# Patient Record
Sex: Female | Born: 1965 | Race: White | Hispanic: No | Marital: Married | Smoking: Former smoker
Health system: Southern US, Community
[De-identification: ages and names within clinical notes are randomized; demographics above are authoritative.]

## PROBLEM LIST (undated history)

## (undated) DIAGNOSIS — O039 Complete or unspecified spontaneous abortion without complication: Secondary | ICD-10-CM

## (undated) DIAGNOSIS — Z807 Family history of other malignant neoplasms of lymphoid, hematopoietic and related tissues: Secondary | ICD-10-CM

## (undated) DIAGNOSIS — K449 Diaphragmatic hernia without obstruction or gangrene: Secondary | ICD-10-CM

## (undated) DIAGNOSIS — R112 Nausea with vomiting, unspecified: Secondary | ICD-10-CM

## (undated) DIAGNOSIS — Z808 Family history of malignant neoplasm of other organs or systems: Secondary | ICD-10-CM

## (undated) DIAGNOSIS — F410 Panic disorder [episodic paroxysmal anxiety] without agoraphobia: Secondary | ICD-10-CM

## (undated) DIAGNOSIS — Z8489 Family history of other specified conditions: Secondary | ICD-10-CM

## (undated) DIAGNOSIS — R32 Unspecified urinary incontinence: Secondary | ICD-10-CM

## (undated) DIAGNOSIS — K760 Fatty (change of) liver, not elsewhere classified: Secondary | ICD-10-CM

## (undated) DIAGNOSIS — K219 Gastro-esophageal reflux disease without esophagitis: Secondary | ICD-10-CM

## (undated) DIAGNOSIS — N3010 Interstitial cystitis (chronic) without hematuria: Secondary | ICD-10-CM

## (undated) DIAGNOSIS — Z803 Family history of malignant neoplasm of breast: Secondary | ICD-10-CM

## (undated) DIAGNOSIS — K5730 Diverticulosis of large intestine without perforation or abscess without bleeding: Secondary | ICD-10-CM

## (undated) DIAGNOSIS — Z800 Family history of malignant neoplasm of digestive organs: Secondary | ICD-10-CM

## (undated) DIAGNOSIS — I10 Essential (primary) hypertension: Secondary | ICD-10-CM

## (undated) DIAGNOSIS — K589 Irritable bowel syndrome without diarrhea: Secondary | ICD-10-CM

## (undated) DIAGNOSIS — M1990 Unspecified osteoarthritis, unspecified site: Secondary | ICD-10-CM

## (undated) DIAGNOSIS — D126 Benign neoplasm of colon, unspecified: Secondary | ICD-10-CM

## (undated) DIAGNOSIS — R51 Headache: Secondary | ICD-10-CM

## (undated) DIAGNOSIS — K648 Other hemorrhoids: Secondary | ICD-10-CM

## (undated) HISTORY — DX: Fatty (change of) liver, not elsewhere classified: K76.0

## (undated) HISTORY — DX: Family history of other specified conditions: Z84.89

## (undated) HISTORY — DX: Gastro-esophageal reflux disease without esophagitis: K21.9

## (undated) HISTORY — DX: Family history of malignant neoplasm of other organs or systems: Z80.8

## (undated) HISTORY — DX: Benign neoplasm of colon, unspecified: D12.6

## (undated) HISTORY — DX: Essential (primary) hypertension: I10

## (undated) HISTORY — PX: SKIN GRAFT: SHX250

## (undated) HISTORY — PX: OTHER SURGICAL HISTORY: SHX169

## (undated) HISTORY — DX: Headache: R51

## (undated) HISTORY — DX: Diverticulosis of large intestine without perforation or abscess without bleeding: K57.30

## (undated) HISTORY — PX: ABDOMINAL HYSTERECTOMY: SHX81

## (undated) HISTORY — DX: Unspecified osteoarthritis, unspecified site: M19.90

## (undated) HISTORY — DX: Panic disorder (episodic paroxysmal anxiety): F41.0

## (undated) HISTORY — PX: NASAL SINUS SURGERY: SHX719

## (undated) HISTORY — DX: Diaphragmatic hernia without obstruction or gangrene: K44.9

## (undated) HISTORY — DX: Complete or unspecified spontaneous abortion without complication: O03.9

## (undated) HISTORY — DX: Family history of malignant neoplasm of breast: Z80.3

## (undated) HISTORY — DX: Other hemorrhoids: K64.8

## (undated) HISTORY — PX: NISSEN FUNDOPLICATION: SHX2091

## (undated) HISTORY — DX: Unspecified urinary incontinence: R32

## (undated) HISTORY — DX: Irritable bowel syndrome, unspecified: K58.9

## (undated) HISTORY — DX: Family history of malignant neoplasm of digestive organs: Z80.0

## (undated) HISTORY — PX: TYMPANOSTOMY TUBE PLACEMENT: SHX32

## (undated) HISTORY — DX: Interstitial cystitis (chronic) without hematuria: N30.10

## (undated) HISTORY — DX: Family history of other malignant neoplasms of lymphoid, hematopoietic and related tissues: Z80.7

---

## 1999-01-30 ENCOUNTER — Emergency Department (HOSPITAL_COMMUNITY): Admission: EM | Admit: 1999-01-30 | Discharge: 1999-01-30 | Payer: Self-pay | Admitting: Emergency Medicine

## 1999-01-30 ENCOUNTER — Encounter: Payer: Self-pay | Admitting: *Deleted

## 1999-04-25 ENCOUNTER — Encounter: Payer: Self-pay | Admitting: Gastroenterology

## 1999-04-25 ENCOUNTER — Ambulatory Visit (HOSPITAL_COMMUNITY): Admission: RE | Admit: 1999-04-25 | Discharge: 1999-04-25 | Payer: Self-pay | Admitting: Gastroenterology

## 2000-09-09 ENCOUNTER — Ambulatory Visit (HOSPITAL_BASED_OUTPATIENT_CLINIC_OR_DEPARTMENT_OTHER): Admission: RE | Admit: 2000-09-09 | Discharge: 2000-09-09 | Payer: Self-pay | Admitting: *Deleted

## 2000-10-31 ENCOUNTER — Encounter (INDEPENDENT_AMBULATORY_CARE_PROVIDER_SITE_OTHER): Payer: Self-pay | Admitting: *Deleted

## 2000-10-31 ENCOUNTER — Ambulatory Visit (HOSPITAL_BASED_OUTPATIENT_CLINIC_OR_DEPARTMENT_OTHER): Admission: RE | Admit: 2000-10-31 | Discharge: 2000-10-31 | Payer: Self-pay | Admitting: *Deleted

## 2001-11-05 ENCOUNTER — Encounter: Payer: Self-pay | Admitting: Family Medicine

## 2001-11-05 ENCOUNTER — Ambulatory Visit (HOSPITAL_COMMUNITY): Admission: RE | Admit: 2001-11-05 | Discharge: 2001-11-05 | Payer: Self-pay | Admitting: Family Medicine

## 2003-05-05 ENCOUNTER — Emergency Department (HOSPITAL_COMMUNITY): Admission: AD | Admit: 2003-05-05 | Discharge: 2003-05-05 | Payer: Self-pay | Admitting: Emergency Medicine

## 2003-05-31 ENCOUNTER — Ambulatory Visit (HOSPITAL_COMMUNITY): Admission: RE | Admit: 2003-05-31 | Discharge: 2003-05-31 | Payer: Self-pay | Admitting: Neurology

## 2003-06-03 ENCOUNTER — Encounter: Admission: RE | Admit: 2003-06-03 | Discharge: 2003-08-30 | Payer: Self-pay | Admitting: Neurology

## 2003-07-15 ENCOUNTER — Other Ambulatory Visit: Admission: RE | Admit: 2003-07-15 | Discharge: 2003-07-15 | Payer: Self-pay | Admitting: Obstetrics and Gynecology

## 2003-07-22 ENCOUNTER — Encounter: Admission: RE | Admit: 2003-07-22 | Discharge: 2003-07-22 | Payer: Self-pay | Admitting: Obstetrics and Gynecology

## 2003-07-22 ENCOUNTER — Encounter: Payer: Self-pay | Admitting: Obstetrics and Gynecology

## 2003-09-07 ENCOUNTER — Emergency Department (HOSPITAL_COMMUNITY): Admission: EM | Admit: 2003-09-07 | Discharge: 2003-09-08 | Payer: Self-pay | Admitting: Emergency Medicine

## 2003-11-26 ENCOUNTER — Ambulatory Visit (HOSPITAL_COMMUNITY): Admission: RE | Admit: 2003-11-26 | Discharge: 2003-11-26 | Payer: Self-pay | Admitting: Family Medicine

## 2004-02-09 ENCOUNTER — Emergency Department (HOSPITAL_COMMUNITY): Admission: EM | Admit: 2004-02-09 | Discharge: 2004-02-09 | Payer: Self-pay | Admitting: Emergency Medicine

## 2004-11-27 ENCOUNTER — Inpatient Hospital Stay (HOSPITAL_COMMUNITY): Admission: AD | Admit: 2004-11-27 | Discharge: 2004-11-27 | Payer: Self-pay | Admitting: Obstetrics and Gynecology

## 2005-04-19 ENCOUNTER — Encounter: Admission: RE | Admit: 2005-04-19 | Discharge: 2005-04-19 | Payer: Self-pay | Admitting: Gynecology

## 2005-04-30 ENCOUNTER — Encounter: Admission: RE | Admit: 2005-04-30 | Discharge: 2005-04-30 | Payer: Self-pay | Admitting: Gynecology

## 2005-12-10 ENCOUNTER — Encounter: Admission: RE | Admit: 2005-12-10 | Discharge: 2005-12-10 | Payer: Self-pay | Admitting: Gynecology

## 2006-04-22 ENCOUNTER — Encounter: Admission: RE | Admit: 2006-04-22 | Discharge: 2006-04-22 | Payer: Self-pay | Admitting: Family Medicine

## 2006-12-19 ENCOUNTER — Ambulatory Visit: Payer: Self-pay | Admitting: Gastroenterology

## 2006-12-19 LAB — CONVERTED CEMR LAB
ALT: 7 units/L (ref 0–40)
AST: 22 units/L (ref 0–37)
Albumin: 3.6 g/dL (ref 3.5–5.2)
Basophils Relative: 0.1 % (ref 0.0–1.0)
Bilirubin Urine: NEGATIVE
Calcium: 8.7 mg/dL (ref 8.4–10.5)
Chloride: 104 meq/L (ref 96–112)
Creatinine, Ser: 0.6 mg/dL (ref 0.4–1.2)
Crystals: NEGATIVE
Eosinophils Relative: 0.7 % (ref 0.0–5.0)
GFR calc non Af Amer: 117 mL/min
Glucose, Bld: 88 mg/dL (ref 70–99)
HCT: 28.9 % — ABNORMAL LOW (ref 36.0–46.0)
Hemoglobin: 10 g/dL — ABNORMAL LOW (ref 12.0–15.0)
MCHC: 34.6 g/dL (ref 30.0–36.0)
Monocytes Absolute: 0.2 10*3/uL (ref 0.2–0.7)
Neutrophils Relative %: 69.8 % (ref 43.0–77.0)
Nitrite: NEGATIVE
RBC: 3.48 M/uL — ABNORMAL LOW (ref 3.87–5.11)
RDW: 12.7 % (ref 11.5–14.6)
Sed Rate: 17 mm/hr (ref 0–25)
Sodium: 140 meq/L (ref 135–145)
Specific Gravity, Urine: >=1.03 (ref 1.000–1.03)
Total Bilirubin: 0.7 mg/dL (ref 0.3–1.2)
Total Protein, Urine: NEGATIVE mg/dL
WBC: 5.2 10*3/uL (ref 4.5–10.5)
pH: 5.5 (ref 5.0–8.0)

## 2007-01-06 ENCOUNTER — Ambulatory Visit: Payer: Self-pay | Admitting: Gastroenterology

## 2007-06-05 ENCOUNTER — Encounter: Admission: RE | Admit: 2007-06-05 | Discharge: 2007-06-05 | Payer: Self-pay | Admitting: Obstetrics and Gynecology

## 2008-10-08 DIAGNOSIS — D126 Benign neoplasm of colon, unspecified: Secondary | ICD-10-CM

## 2008-10-08 HISTORY — DX: Benign neoplasm of colon, unspecified: D12.6

## 2009-02-24 DIAGNOSIS — R519 Headache, unspecified: Secondary | ICD-10-CM | POA: Insufficient documentation

## 2009-02-24 DIAGNOSIS — R32 Unspecified urinary incontinence: Secondary | ICD-10-CM | POA: Insufficient documentation

## 2009-02-24 DIAGNOSIS — K449 Diaphragmatic hernia without obstruction or gangrene: Secondary | ICD-10-CM | POA: Insufficient documentation

## 2009-02-24 DIAGNOSIS — F410 Panic disorder [episodic paroxysmal anxiety] without agoraphobia: Secondary | ICD-10-CM | POA: Insufficient documentation

## 2009-02-24 DIAGNOSIS — K5730 Diverticulosis of large intestine without perforation or abscess without bleeding: Secondary | ICD-10-CM | POA: Insufficient documentation

## 2009-02-24 DIAGNOSIS — K219 Gastro-esophageal reflux disease without esophagitis: Secondary | ICD-10-CM | POA: Insufficient documentation

## 2009-02-24 DIAGNOSIS — I10 Essential (primary) hypertension: Secondary | ICD-10-CM | POA: Insufficient documentation

## 2009-02-24 DIAGNOSIS — R51 Headache: Secondary | ICD-10-CM | POA: Insufficient documentation

## 2009-02-24 DIAGNOSIS — M1990 Unspecified osteoarthritis, unspecified site: Secondary | ICD-10-CM | POA: Insufficient documentation

## 2009-02-24 DIAGNOSIS — T7840XA Allergy, unspecified, initial encounter: Secondary | ICD-10-CM | POA: Insufficient documentation

## 2009-02-24 DIAGNOSIS — K589 Irritable bowel syndrome without diarrhea: Secondary | ICD-10-CM | POA: Insufficient documentation

## 2009-02-25 ENCOUNTER — Ambulatory Visit: Payer: Self-pay | Admitting: Gastroenterology

## 2009-02-25 DIAGNOSIS — R197 Diarrhea, unspecified: Secondary | ICD-10-CM | POA: Insufficient documentation

## 2009-02-25 LAB — CONVERTED CEMR LAB
ALT: 6 units/L (ref 0–35)
Alkaline Phosphatase: 93 units/L (ref 39–117)
Basophils Absolute: 0.1 10*3/uL (ref 0.0–0.1)
Basophils Relative: 0.9 % (ref 0.0–3.0)
Bilirubin, Direct: 0.1 mg/dL (ref 0.0–0.3)
CO2: 26 meq/L (ref 19–32)
Chloride: 104 meq/L (ref 96–112)
Eosinophils Relative: 0.6 % (ref 0.0–5.0)
HCT: 37.6 % (ref 36.0–46.0)
Iron: 59 ug/dL (ref 42–145)
Lymphs Abs: 2.2 10*3/uL (ref 0.7–4.0)
MCHC: 33.9 g/dL (ref 30.0–36.0)
MCV: 82.8 fL (ref 78.0–100.0)
Neutro Abs: 4.4 10*3/uL (ref 1.4–7.7)
Potassium: 3.6 meq/L (ref 3.5–5.1)
RBC: 4.54 M/uL (ref 3.87–5.11)
Saturation Ratios: 12.3 % — ABNORMAL LOW (ref 20.0–50.0)
Sodium: 143 meq/L (ref 135–145)
Total Protein: 7.3 g/dL (ref 6.0–8.3)
Transferrin: 343.8 mg/dL (ref 212.0–360.0)
WBC: 6.9 10*3/uL (ref 4.5–10.5)

## 2009-03-04 ENCOUNTER — Encounter: Payer: Self-pay | Admitting: Gastroenterology

## 2009-03-04 ENCOUNTER — Ambulatory Visit: Payer: Self-pay | Admitting: Gastroenterology

## 2009-03-09 ENCOUNTER — Encounter: Payer: Self-pay | Admitting: Gastroenterology

## 2009-03-25 ENCOUNTER — Encounter: Admission: RE | Admit: 2009-03-25 | Discharge: 2009-03-25 | Payer: Self-pay | Admitting: Obstetrics and Gynecology

## 2009-06-16 ENCOUNTER — Encounter: Admission: RE | Admit: 2009-06-16 | Discharge: 2009-06-16 | Payer: Self-pay | Admitting: Family Medicine

## 2010-04-20 ENCOUNTER — Encounter: Admission: RE | Admit: 2010-04-20 | Discharge: 2010-04-20 | Payer: Self-pay | Admitting: Obstetrics and Gynecology

## 2011-02-23 NOTE — Assessment & Plan Note (Signed)
Alicia Bentley                         GASTROENTEROLOGY OFFICE NOTE   Alicia Bentley, Alicia Bentley                        MRN:          [id number]  DATE:12/19/2006                            DOB:          1966/06/04    Alicia Bentley is a 45 year old white female, referred through Alicia Bentley  emergency room for evaluation of abdominal pain.   Over the last several months, the patient has had 3 episodes of severe,  spasmodic-type pain in her suprapubic area.  The last one requiring an  emergency room visit where she apparently had a flat plate of her  abdomen done and lab data.  This is not available for review despite our  request.  In between these episodes, she has alternating diarrhea and  constipation with gas and bloating, but has no melena or hematochezia.  She has no upper GI or hepatobiliary complaints, is status post  fundoplication in 2000.  Her last colonoscopy exam additionally was in  January of 2000 because of lower abdominal pain and suspected  diverticulitis despite a negative CT scan.  I could see no evidence of  diverticulosis at that time.   She denies fever, chills, anorexia, weight loss, any specific food  intolerances or precipitated elements to her pain.  She has been on  Levsin and then Bentyl without improvement.   PAST MEDICAL HISTORY:  Remarkable for central hypertension and a history  of panic disorders.   MEDICATIONS:  1. Labetalol 20 mg a day.  2. Nasonex daily.  3. Benefiber daily.  4. Zyrtec 10 mg a day.  5. Aspirin 81 mg.   She denies allergies   FAMILY HISTORY:  Noncontributory.   SOCIAL HISTORY:  She is married, lives with her husband, has a college  degree.  She does not smoke or use ethanol.   REVIEW OF SYSTEMS:  Noncontributory.  Last menstrual period was December 06, 2006.   She is a healthy-appearing white female, appearing stated age, in no  acute distress.  She is 5 feet 5-1/2 inches tall, weight 302 pounds,  blood pressure  122/84, pulse was 64 and regular.  I could not appreciate stigmata of chronic liver disease.  Her chest was clear and there were no murmurs, gallops, or rubs noted.  ABDOMINAL EXAM:  Unremarkable, without organomegaly, masses, or  tenderness.  Bowel sounds were normal.  EXTREMITIES:  Unremarkable.  MENTAL STATUS:  Clear.  Inspection of the rectum was unremarkable, as was rectal exam, without  impaction or tenderness.  Stool was guaiac negative.   ASSESSMENT:  Mrs. Kistler appears to have irritable bowel syndrome, but  her pain seems out of kilter with this diagnosis.  She apparently has  regular gynecological checkups, and this has all been negative, but  certainly a consideration.   RECOMMENDATIONS:  1. Repeat lab data.  2. Outpatient colonoscopy.  3. Continue high-fiber diet, and Benefiber with p.r.n. antispasmodics.  4. Consider abdominal/pelvic CT scan.     Alicia Bentley. Alicia Motto, MD, Caleen Essex, FAGA  Electronically Signed    DRP/MedQ  DD: 12/19/2006  DT: 12/20/2006  Job #: [id number]  cc:   Alicia Bentley, M.D.

## 2011-02-23 NOTE — Op Note (Signed)
[place]. [facility]  Patient:    Alicia Bentley, Alicia Bentley                         MRN: [id number] Proc. Date: 10/31/00 Adm. Date:  [id number] Disc. Date: [id number] Attending:  Claudina Lick                           Operative Report  PREOPERATIVE DIAGNOSES: 1. 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 2. Deviated nasal septum. 3. Chronic serous otitis media.  POSTOPERATIVE DIAGNOSES: 1. 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 2. Deviated nasal septum. 3. Chronic serous otitis media.  PROCEDURES PERFORMED: 1. Nasal septoplasty. 2. Bilateral endoscopic anterior ethmoidectomy. 3. Bilateral endoscopic maxillary antrostomy with removal of maxillary cysts. 4. Bilateral myringotomy.  SURGEON:  Robert L. Lyman Bishop, M.D.  ANESTHESIA:  General.  INDICATION FOR PROCEDURE:  This patient has had a lifelong history of recurring ear infections, chronic recurring sinusitis, had previous nasal septoplasty, t looked like she had had turbinate reductions as well, by another otoloaryngologist.  Also a history of tube myringotomies in the past. CT scan showed some partial soft tissue blockage of the osteomeatal complexes, and large maxillary sinus cysts, which had enlarged in size and were associated with pain in the upper teeth that would respond to antibiotics. Patient has also had some persistent fullness, pressure, and pain in her ears, with suspected middle ear fluid.  Patient admitted for surgery.  DESCRIPTION OF PROCEDURE:  After satisfactory general endotracheal anesthesia had been induced, topical epinephrine packs were placed inferonasally, after which the nasal septum, both middle turbinates, and the anterolateral nasal wall on each side was infiltrated with 1% Xylocaine containing 1:100,000 epinephrine for hemostasis, using a total of 7 cc.  The nose and face were then prepped with Betadine and sterile drapes applied.   Patient had obviously had a previous nasal septoplasty and partial reduction of the inferior nasal turbinates.  There was still septal deviation to the right, especially superiorly, with marked thickening of the nasal septum.  Inferiorly, the septum was reasonably straight and midline.  A left anterior septal incision was made, dissection carried around the caudal margin of the septum, and the mucoperichondrial and periosteal flap elevated on the right side.  The septal cartilage was partially separated from the remaining perpendicular plate, and the mucoperiosteal flap was then elevated on the left side.  Most of the high septal bulge to the right involved the perpendicular plate, and this was removed with the Jansen-[place] downbiting forceps.  More inferiorly, a small spur from the right side of the maxillary crest was taken down with the chisel.  The septal flaps were then reapproximated with mattress suture of 4-0 plain gut and the incision closed with running 5-0 chromic gut.  By removing the high septal deviation, I could now adequately visualize the right middle meatus area and using the 0 degree endoscope and a sickle knife, an incision was made along the uncinate process on each side and the uncinate process removed.  The bone in the anterior ethmoid region on each side was extremely hard and thick, perhaps from previous surgery.  The maxillary ostium on each side was found with a right-angle suction, and this was opened up widely into the anterior fontanelle with the backbiting forceps and through the enlarged maxillary ostium on each side, I was able to grasp the dome of the retention cyst and remove  a significant portion of the mucosa.  On the right side, there was some thick grayish exudate in the sinus that was evacuated with the suction.  On the left side, the fluid was more clear mucous-type.  A small strip of Adaptic gauze impregnated with Cortisporin ointment was placed  in each anterior ethmoid region, and each side of the nose was then packed with a folded strip of Telfa gauze, again coated with Cortisporin ointment.  Using the operative microscope, examination of both ears showed that the ears had been well-aerated with ballooning out of atrophic areas of the drum, separated by bands of scarring.  A small radial myringotomy incision was made at about 9 oclock in each eardrum.  There was no middle ear fluid; therefore, tubes were not inserted.  Cortisporin drops were instilled.  Patient was given IV Decadron and Ancef intraoperatively.  Estimated blood loss 10-15 cc.  The patient tolerated the procedure well, was awakened from anesthesia, and taken to the recovery room in satisfactory condition. DD:  10/31/00 TD:  10/31/00 Job: [id number]

## 2011-06-21 ENCOUNTER — Other Ambulatory Visit: Payer: Self-pay | Admitting: Obstetrics and Gynecology

## 2011-06-21 DIAGNOSIS — Z1231 Encounter for screening mammogram for malignant neoplasm of breast: Secondary | ICD-10-CM

## 2011-07-16 ENCOUNTER — Ambulatory Visit: Payer: Self-pay | Admitting: Unknown Physician Specialty

## 2011-07-31 ENCOUNTER — Ambulatory Visit
Admission: RE | Admit: 2011-07-31 | Discharge: 2011-07-31 | Disposition: A | Discharge status: Home / Self Care | Payer: BC Managed Care – PPO | Source: Ambulatory Visit | Attending: Obstetrics and Gynecology | Admitting: Obstetrics and Gynecology

## 2011-07-31 DIAGNOSIS — Z1231 Encounter for screening mammogram for malignant neoplasm of breast: Secondary | ICD-10-CM

## 2011-11-08 ENCOUNTER — Ambulatory Visit
Admission: RE | Admit: 2011-11-08 | Discharge: 2011-11-08 | Disposition: A | Discharge status: Home / Self Care | Payer: BC Managed Care – PPO | Source: Ambulatory Visit | Attending: Physician Assistant | Admitting: Physician Assistant

## 2011-11-08 ENCOUNTER — Other Ambulatory Visit: Payer: Self-pay | Admitting: Physician Assistant

## 2011-11-08 DIAGNOSIS — R05 Cough: Secondary | ICD-10-CM

## 2011-11-08 DIAGNOSIS — R0602 Shortness of breath: Secondary | ICD-10-CM

## 2011-11-08 DIAGNOSIS — R5383 Other fatigue: Secondary | ICD-10-CM

## 2011-11-08 DIAGNOSIS — R059 Cough, unspecified: Secondary | ICD-10-CM

## 2011-11-08 DIAGNOSIS — R42 Dizziness and giddiness: Secondary | ICD-10-CM

## 2012-07-16 ENCOUNTER — Other Ambulatory Visit: Payer: Self-pay | Admitting: Family Medicine

## 2012-07-16 DIAGNOSIS — M545 Low back pain, unspecified: Secondary | ICD-10-CM

## 2012-07-21 ENCOUNTER — Ambulatory Visit
Admission: RE | Admit: 2012-07-21 | Discharge: 2012-07-21 | Disposition: A | Discharge status: Home / Self Care | Payer: BC Managed Care – PPO | Source: Ambulatory Visit | Attending: Family Medicine | Admitting: Family Medicine

## 2012-07-21 DIAGNOSIS — M545 Low back pain, unspecified: Secondary | ICD-10-CM

## 2012-08-13 ENCOUNTER — Other Ambulatory Visit: Payer: Self-pay | Admitting: Obstetrics and Gynecology

## 2012-08-13 DIAGNOSIS — N644 Mastodynia: Secondary | ICD-10-CM

## 2012-08-28 ENCOUNTER — Ambulatory Visit
Admission: RE | Admit: 2012-08-28 | Discharge: 2012-08-28 | Disposition: A | Discharge status: Home / Self Care | Payer: BC Managed Care – PPO | Source: Ambulatory Visit | Attending: Obstetrics and Gynecology | Admitting: Obstetrics and Gynecology

## 2012-08-28 DIAGNOSIS — N644 Mastodynia: Secondary | ICD-10-CM

## 2012-12-01 ENCOUNTER — Ambulatory Visit
Admission: RE | Admit: 2012-12-01 | Discharge: 2012-12-01 | Disposition: A | Discharge status: Home / Self Care | Payer: BC Managed Care – PPO | Source: Ambulatory Visit | Attending: Family Medicine | Admitting: Family Medicine

## 2012-12-01 ENCOUNTER — Other Ambulatory Visit: Payer: Self-pay | Admitting: Family Medicine

## 2012-12-01 DIAGNOSIS — R109 Unspecified abdominal pain: Secondary | ICD-10-CM

## 2012-12-01 DIAGNOSIS — K5900 Constipation, unspecified: Secondary | ICD-10-CM

## 2012-12-25 ENCOUNTER — Encounter: Payer: Self-pay | Admitting: Gastroenterology

## 2012-12-25 ENCOUNTER — Ambulatory Visit (INDEPENDENT_AMBULATORY_CARE_PROVIDER_SITE_OTHER): Payer: BC Managed Care – PPO | Admitting: Gastroenterology

## 2012-12-25 ENCOUNTER — Other Ambulatory Visit (INDEPENDENT_AMBULATORY_CARE_PROVIDER_SITE_OTHER): Payer: BC Managed Care – PPO

## 2012-12-25 VITALS — BP 120/90 | HR 76 | Wt 316.0 lb

## 2012-12-25 DIAGNOSIS — Z86010 Personal history of colonic polyps: Secondary | ICD-10-CM

## 2012-12-25 DIAGNOSIS — Z860101 Personal history of adenomatous and serrated colon polyps: Secondary | ICD-10-CM

## 2012-12-25 DIAGNOSIS — K5730 Diverticulosis of large intestine without perforation or abscess without bleeding: Secondary | ICD-10-CM

## 2012-12-25 DIAGNOSIS — K219 Gastro-esophageal reflux disease without esophagitis: Secondary | ICD-10-CM

## 2012-12-25 LAB — COMPREHENSIVE METABOLIC PANEL
AST: 22 U/L (ref 0–37)
Alkaline Phosphatase: 89 U/L (ref 39–117)
BUN: 14 mg/dL (ref 6–23)
Creatinine, Ser: 0.7 mg/dL (ref 0.4–1.2)
Total Bilirubin: 0.6 mg/dL (ref 0.3–1.2)

## 2012-12-25 LAB — FOLATE: Folate: 15.3 ng/mL (ref >5.9)

## 2012-12-25 LAB — HEPATIC FUNCTION PANEL
ALT: 5 U/L (ref 0–35)
Bilirubin, Direct: 0.1 mg/dL (ref 0.0–0.3)
Total Bilirubin: 0.6 mg/dL (ref 0.3–1.2)

## 2012-12-25 LAB — CBC WITH DIFFERENTIAL/PLATELET
Basophils Relative: 0.4 % (ref 0.0–3.0)
Eosinophils Absolute: 0 10*3/uL (ref 0.0–0.7)
HCT: 38.5 % (ref 36.0–46.0)
Hemoglobin: 13 g/dL (ref 12.0–15.0)
Lymphs Abs: 2 10*3/uL (ref 0.7–4.0)
MCHC: 33.7 g/dL (ref 30.0–36.0)
MCV: 81.2 fl (ref 78.0–100.0)
Monocytes Absolute: 0.3 10*3/uL (ref 0.1–1.0)
Neutro Abs: 4.2 10*3/uL (ref 1.4–7.7)
Neutrophils Relative %: 63.7 % (ref 43.0–77.0)
RBC: 4.75 Mil/uL (ref 3.87–5.11)

## 2012-12-25 LAB — IBC PANEL
Iron: 52 ug/dL (ref 42–145)
Saturation Ratios: 11 % — ABNORMAL LOW (ref 20.0–50.0)

## 2012-12-25 LAB — FERRITIN: Ferritin: 14.5 ng/mL (ref 10.0–291.0)

## 2012-12-25 LAB — VITAMIN B12: Vitamin B-12: 349 pg/mL (ref 211–911)

## 2012-12-25 LAB — TSH: TSH: 2.83 u[IU]/mL (ref 0.35–5.50)

## 2012-12-25 MED ORDER — MOVIPREP 100 G PO SOLR: 1.0000 | Freq: Once | ORAL | Status: DC

## 2012-12-25 MED ORDER — CIPROFLOXACIN HCL 500 MG PO TABS: 500.0000 mg | ORAL_TABLET | Freq: Two times a day (BID) | ORAL | Status: DC

## 2012-12-25 MED ORDER — HYOSCYAMINE SULFATE 0.125 MG SL SUBL: 0.1250 mg | SUBLINGUAL_TABLET | SUBLINGUAL | Status: DC | PRN

## 2012-12-25 NOTE — Addendum Note (Signed)
Addended by: Ok Anis A on: 12/25/2012 11:05 AM   Modules accepted: Orders

## 2012-12-25 NOTE — Progress Notes (Signed)
History of Present Illness:  This is a morbidly obese 47 year old Caucasian female who in the past of seeing because of chronic diarrhea.  Colonoscopy in May of 2010 showed a prominent right colon polyp which was excised.  She's been doing well since that time without significant diarrhea or constipation until several weeks ago when she developed sudden constipation with left lower quadrant pain, gas and bloating with associated nausea but no emesis.  She had a KUB which showed evidence of constipation but no obstruction.  She was treated with a series of laxatives by her primary care physician including magnesium citrate and MiraLax.  She currently is on stool softeners, Citrucel, milk of magnesia is having every other day bowel movements without melena or hematochezia.  There is been no fever, chills, or upper GI or hepatobiliary complaints.  She does have acid reflux is been on the Dexilant 60 mg a day for 2 years.  Previously because of her IBS diarrhea she was on when necessary dicyclomine and when necessary Levsin.  She denies a specific food intolerances, anorexia, weight loss, or history of gallbladder liver disease.  Patient been obese for many years but denies associated cardiopulmonary symptoms.   I have reviewed this patient's present history, medical and surgical past history, allergies and medications.     ROS:   All systems were reviewed and are negative unless otherwise stated in the HPI.    Physical healthy-appearing but obese female appears stated age.  Blood pressure 120/90, pulse 76 and regular, and weight 316 with a BMI of 52.59.  98% oxygen saturation.  I cannot appreciate stigmata of chronic liver disease. General well developed well nourished patient in no acute distress, appearing their stated age Eyes PERRLA, no icterus, fundoscopic exam per opthamologist Skin no lesions noted Neck supple, no adenopathy, no thyroid enlargement, no tenderness Chest clear to percussion and  auscultation Heart no significant murmurs, gallops or rubs noted Abdomen no hepatosplenomegaly masses or tenderness, BS normal.  Rectal inspection normal no fissures, or fistulae noted.  No masses or tenderness on digital exam. Stool guaiac negative.  Anal skin tags noted.  Digital examination difficult because of her obesity. Extremities no acute joint lesions, edema, phlebitis or evidence of cellulitis. Neurologic patient oriented x 3, cranial nerves intact, no focal neurologic deficits noted. Psychological mental status normal and normal affect.  Assessment and plan: This patient probably had subacute diverticulitis several weeks ago which prompted her severe constipation in a long-term history of mild chronic IBS diarrhea..  There is no significant abdominal tenderness at this time, and bowel sounds are normal.  I have decided to place her on Cipro 500 mg twice a day, and we will perform colonoscopy next week with standard Movie prep.  Because of her obesity this will have to be done and and in a hospital setting. She in the past as had allergic reactions to tramadol.  I've given her some Levsin use when necessary, continue her current laxative therapy, and labs drawn for review including CBC and sedimentation rate.  She has a second cousin that did have colon cancer.  Previous colon biopsy showed no evidence of microscopic or collagenous colitis.  She has been treated by primary care physician for GERD, and I've asked continue her Dexilant 60 mg a day currently until her lower GI issues have been addressed.  Is no associated dysphagia or hepatobiliary complaints.  Encounter Diagnoses  Name Primary?  Marland Kitchen Hx of adenomatous colonic polyps Yes  . Diverticulosis of  colon without hemorrhage

## 2012-12-25 NOTE — Patient Instructions (Addendum)
  Your physician has requested that you go to the basement for the following lab work before leaving today: [employer], and Anemia Panel.  We have sent the following medications to your pharmacy for you to pick up at your convenience: Levsin, please take as needed for stomach spasms.  AND Cipro, please take as directed.  You have been scheduled for a colonoscopy at [facility]. Please follow written instructions given to you at your visit today.  Please pick up your prep kit at the pharmacy within the next 1-3 days. If you use inhalers (even only as needed), please bring them with you on the day of your procedure. ______________________________________________________________________________________________________________________                                               We are excited to introduce MyChart, a new best-in-class service that provides you online access to important information in your electronic medical record. We want to make it easier for you to view your health information - all in one secure location - when and where you need it. We expect MyChart will enhance the quality of care and service we provide.  When you register for MyChart, you can:    View your test results.    Request appointments and receive appointment reminders via email.    Request medication renewals.    View your medical history, allergies, medications and immunizations.    Communicate with your physician's office through a password-protected site.    Conveniently print information such as your medication lists.  To find out if MyChart is right for you, please talk to a member of our clinical staff today. We will gladly answer your questions about this free health and wellness tool.  If you are age 90 or older and want a member of your family to have access to your record, you must provide written consent by completing a proxy form available at our office. Please speak to our  clinical staff about guidelines regarding accounts for patients younger than age 87.  As you activate your MyChart account and need any technical assistance, please call the MyChart technical support line at [phone]) 83-CHART [phone]) or email your question to [email or web address]. If you email your question(s), please include your name, a return phone number and the best time to reach you.  If you have non-urgent health-related questions, you can send a message to our office through MyChart at [email or web address]. If you have a medical emergency, call 911.  Thank you for using MyChart as your new health and wellness resource!   MyChart licensed from [employer],  [id number]. Patents Pending.

## 2012-12-26 ENCOUNTER — Telehealth: Payer: Self-pay | Admitting: *Deleted

## 2012-12-26 NOTE — Telephone Encounter (Signed)
lmom for pt to call back

## 2012-12-26 NOTE — Telephone Encounter (Signed)
Message copied by Florene Glen on Fri Dec 26, 2012  4:20 PM ------      Message from: Jarold Motto, DAVID R      Created: Fri Dec 26, 2012  8:10 AM       Start daily tandem ------

## 2012-12-29 ENCOUNTER — Encounter (HOSPITAL_COMMUNITY)
Admission: RE | Disposition: A | Discharge status: Home / Self Care | Payer: Self-pay | Source: Ambulatory Visit | Attending: Gastroenterology

## 2012-12-29 ENCOUNTER — Ambulatory Visit (HOSPITAL_COMMUNITY)
Admission: RE | Admit: 2012-12-29 | Discharge: 2012-12-29 | Disposition: A | Discharge status: Home / Self Care | Payer: BC Managed Care – PPO | Source: Ambulatory Visit | Attending: Gastroenterology | Admitting: Gastroenterology

## 2012-12-29 ENCOUNTER — Encounter (HOSPITAL_COMMUNITY): Payer: Self-pay

## 2012-12-29 DIAGNOSIS — D126 Benign neoplasm of colon, unspecified: Secondary | ICD-10-CM | POA: Insufficient documentation

## 2012-12-29 DIAGNOSIS — Z860100 Personal history of colon polyps, unspecified: Secondary | ICD-10-CM | POA: Insufficient documentation

## 2012-12-29 DIAGNOSIS — Z800 Family history of malignant neoplasm of digestive organs: Secondary | ICD-10-CM | POA: Insufficient documentation

## 2012-12-29 DIAGNOSIS — Z6841 Body Mass Index (BMI) 40.0 and over, adult: Secondary | ICD-10-CM | POA: Insufficient documentation

## 2012-12-29 DIAGNOSIS — K5730 Diverticulosis of large intestine without perforation or abscess without bleeding: Secondary | ICD-10-CM | POA: Insufficient documentation

## 2012-12-29 DIAGNOSIS — R197 Diarrhea, unspecified: Secondary | ICD-10-CM | POA: Insufficient documentation

## 2012-12-29 HISTORY — PX: COLONOSCOPY: SHX5424

## 2012-12-29 SURGERY — COLONOSCOPY
Anesthesia: Moderate Sedation

## 2012-12-29 MED ORDER — MIDAZOLAM HCL 10 MG/2ML IJ SOLN
INTRAMUSCULAR | Status: AC
  Filled 2012-12-29: qty 4

## 2012-12-29 MED ORDER — DIPHENHYDRAMINE HCL 50 MG/ML IJ SOLN
INTRAMUSCULAR | Status: AC
  Filled 2012-12-29: qty 1

## 2012-12-29 MED ORDER — SODIUM CHLORIDE 0.9 % IV SOLN
INTRAVENOUS | Status: DC
  Administered 2012-12-29: 15:00:00 via INTRAVENOUS

## 2012-12-29 MED ORDER — MIDAZOLAM HCL 10 MG/2ML IJ SOLN
INTRAMUSCULAR | Status: DC | PRN
  Administered 2012-12-29 (×4): 2.5 mg via INTRAVENOUS

## 2012-12-29 MED ORDER — FENTANYL CITRATE 0.05 MG/ML IJ SOLN
INTRAMUSCULAR | Status: DC | PRN
  Administered 2012-12-29 (×4): 25 ug via INTRAVENOUS

## 2012-12-29 MED ORDER — FENTANYL CITRATE 0.05 MG/ML IJ SOLN
INTRAMUSCULAR | Status: AC
  Filled 2012-12-29: qty 4

## 2012-12-29 NOTE — Interval H&P Note (Signed)
History and Physical Interval Note:  12/29/2012 2:54 PM  Alicia Bentley  has presented today for surgery, with the diagnosis of constipation family hx colon ca  The various methods of treatment have been discussed with the patient and family. After consideration of risks, benefits and other options for treatment, the patient has consented to  Procedure(s): COLONOSCOPY (N/A) as a surgical intervention .  The patient's history has been reviewed, patient examined, no change in status, stable for surgery.  I have reviewed the patient's chart and labs.  Questions were answered to the patient's satisfaction.     Sheryn Bison

## 2012-12-29 NOTE — H&P (View-Only) (Signed)
History of Present Illness:  This is a morbidly obese 47-year-old Caucasian female who in the past of seeing because of chronic diarrhea.  Colonoscopy in May of 2010 showed a prominent right colon polyp which was excised.  She's been doing well since that time without significant diarrhea or constipation until several weeks ago when she developed sudden constipation with left lower quadrant pain, gas and bloating with associated nausea but no emesis.  She had a KUB which showed evidence of constipation but no obstruction.  She was treated with a series of laxatives by her primary care physician including magnesium citrate and MiraLax.  She currently is on stool softeners, Citrucel, milk of magnesia is having every other day bowel movements without melena or hematochezia.  There is been no fever, chills, or upper GI or hepatobiliary complaints.  She does have acid reflux is been on the Dexilant 60 mg a day for 2 years.  Previously because of her IBS diarrhea she was on when necessary dicyclomine and when necessary Levsin.  She denies a specific food intolerances, anorexia, weight loss, or history of gallbladder liver disease.  Patient been obese for many years but denies associated cardiopulmonary symptoms.   I have reviewed this patient's present history, medical and surgical past history, allergies and medications.     ROS:   All systems were reviewed and are negative unless otherwise stated in the HPI.    Physical healthy-appearing but obese female appears stated age.  Blood pressure 120/90, pulse 76 and regular, and weight 316 with a BMI of 52.59.  98% oxygen saturation.  I cannot appreciate stigmata of chronic liver disease. General well developed well nourished patient in no acute distress, appearing their stated age Eyes PERRLA, no icterus, fundoscopic exam per opthamologist Skin no lesions noted Neck supple, no adenopathy, no thyroid enlargement, no tenderness Chest clear to percussion and  auscultation Heart no significant murmurs, gallops or rubs noted Abdomen no hepatosplenomegaly masses or tenderness, BS normal.  Rectal inspection normal no fissures, or fistulae noted.  No masses or tenderness on digital exam. Stool guaiac negative.  Anal skin tags noted.  Digital examination difficult because of her obesity. Extremities no acute joint lesions, edema, phlebitis or evidence of cellulitis. Neurologic patient oriented x 3, cranial nerves intact, no focal neurologic deficits noted. Psychological mental status normal and normal affect.  Assessment and plan: This patient probably had subacute diverticulitis several weeks ago which prompted her severe constipation in a long-term history of mild chronic IBS diarrhea..  There is no significant abdominal tenderness at this time, and bowel sounds are normal.  I have decided to place her on Cipro 500 mg twice a day, and we will perform colonoscopy next week with standard Movie prep.  Because of her obesity this will have to be done and and in a hospital setting. She in the past as had allergic reactions to tramadol.  I've given her some Levsin use when necessary, continue her current laxative therapy, and labs drawn for review including CBC and sedimentation rate.  She has a second cousin that did have colon cancer.  Previous colon biopsy showed no evidence of microscopic or collagenous colitis.  She has been treated by primary care physician for GERD, and I've asked continue her Dexilant 60 mg a day currently until her lower GI issues have been addressed.  Is no associated dysphagia or hepatobiliary complaints.  Encounter Diagnoses  Name Primary?  . Hx of adenomatous colonic polyps Yes  . Diverticulosis of   colon without hemorrhage     

## 2012-12-29 NOTE — Brief Op Note (Signed)
12/29/2012  3:25 PM  PATIENT:  Alicia Bentley  47 y.o. female  PRE-OPERATIVE DIAGNOSIS:  constipation family hx colon ca  POST-OPERATIVE DIAGNOSIS:  Diverticulosis, Ascedning Colon Polyp  PROCEDURE:  Procedure(s): COLONOSCOPY (N/A)colonoscope passed easily into  cecum.A 4mm flat ascending polyp cold snare removed,mild tics in left colon noted,otherwise normal exam.  SURGEON:  Surgeon(s) and Role:    * Mardella Layman, MD - Primary  PHYSICIAN ASSISTANT:   ASSISTANTS:   ANESTHESIA:   IV sedation  EBL:   none  BLOOD ADMINISTERED:none  DRAINS: none   LOCAL MEDICATIONS USED:  NONE  SPECIMEN:  Cold snare of polyp  DISPOSITION OF SPECIMEN:  PATHOLOGY  COUNTS:  NO none  TOURNIQUET:  * No tourniquets in log *  DICTATION: .Note written in EPIC  PLAN OF CARE: Discharge to home after PACU  PATIENT DISPOSITION:  PACU - hemodynamically stable.   Delay start of Pharmacological VTE agent (>24hrs) due to surgical blood loss or risk of bleeding: no

## 2012-12-29 NOTE — Op Note (Signed)
[facility] [address] [place], [address]   COLONOSCOPY PROCEDURE REPORT  PATIENT: Alicia Bentley, Alicia Bentley  MR#: [id number] BIRTHDATE: 07/23/1966 , 47  yrs. old GENDER: Female ENDOSCOPIST: Mardella Layman, MD, [facility] REFERRED BY: PROCEDURE DATE:  12/29/2012 PROCEDURE:   Colonoscopy with snare polypectomy ASA CLASS:   Class III INDICATIONS:Patient's personal history of adenomatous colon polyps and abdominal pain in the lower left quadrant. MEDICATIONS: Fentanyl 100 mcg IV and Versed 10 mg IV  DESCRIPTION OF PROCEDURE:   After the risks and benefits and of the procedure were explained, informed consent was obtained.  A digital rectal exam revealed no abnormalities of the rectum.    The endoscope was introduced through the anus and advanced to the cecum, which was identified by both the appendix and ileocecal valve .  The quality of the prep was excellent, using MoviPrep . The instrument was then slowly withdrawn as the colon was fully examined.     COLON FINDINGS: Moderate diverticulosis was noted at the splenic flexure and in the descending colon.   A smooth sessile polyp ranging between 3-61mm in size was found in the ascending colon.  A polypectomy was performed with a cold snare.  The resection was complete and the polyp tissue was completely retrieved. Retroflexed views revealed no abnormalities.     The scope was then withdrawn from the patient and the procedure completed.  COMPLICATIONS: There were no complications. ENDOSCOPIC IMPRESSION: 1.   Moderate diverticulosis was noted at the splenic flexure and in the descending colon 2.   Sessile polyp ranging between 3-74mm in size was found in the ascending colon; polypectomy was performed with a cold snare  RECOMMENDATIONS: 1.  Await biopsy results 2.  Metamucil or benefiber 3.  OP follow-up is advised on a PRN basis. 4.  Repeat Colonoscopy in 5 years.   REPEAT  EXAM:  cc:  _______________________________ eSignedMardella Layman, MD, [facility] 12/29/2012 4:06 PM

## 2012-12-30 ENCOUNTER — Telehealth: Payer: Self-pay | Admitting: Gastroenterology

## 2012-12-30 ENCOUNTER — Encounter (HOSPITAL_COMMUNITY): Payer: Self-pay | Admitting: Gastroenterology

## 2012-12-30 MED ORDER — DICYCLOMINE HCL 10 MG PO CAPS: 10.0000 mg | ORAL_CAPSULE | Freq: Three times a day (TID) | ORAL | Status: DC

## 2012-12-30 NOTE — Telephone Encounter (Signed)
Informed pt of lab results and asked her to take an OTC iron tab with at least 325mg  of elemental iron in it; warned her of dark stools and possible constipation. Pt stated understanding.

## 2012-12-30 NOTE — Addendum Note (Signed)
Addended by: Florene Glen on: 12/30/2012 04:39 PM   Modules accepted: Orders

## 2012-12-30 NOTE — Telephone Encounter (Signed)
Pt reports she's still having the abdominal pain in her LLQ and has been on Cipro since 12/25/12. COLON yesterday showed mod. Diverticulosis in splenic flexure and ascending colon plus a polyp was removed in descending. She stated the pain is why she came to see [place] along with a new onset of constipation. She did have a small stool last night and this am. Pt wants to know: 1) how long should the pain from diverticulitis last? 2) she wants a refill on Dicyclomine; states this always helped in the past, but she has been ordered SL Levsin?  Thanks.

## 2012-12-30 NOTE — Telephone Encounter (Signed)
Informed pt to complete the cipro and I will order dicyclomine and she can decide what works best; pt stated understanding.

## 2012-12-30 NOTE — Telephone Encounter (Signed)
Either is ok,finish cipro

## 2012-12-31 ENCOUNTER — Encounter: Payer: Self-pay | Admitting: Gastroenterology

## 2013-04-14 ENCOUNTER — Other Ambulatory Visit (INDEPENDENT_AMBULATORY_CARE_PROVIDER_SITE_OTHER): Payer: BC Managed Care – PPO

## 2013-04-14 ENCOUNTER — Encounter: Payer: Self-pay | Admitting: Gastroenterology

## 2013-04-14 ENCOUNTER — Ambulatory Visit (INDEPENDENT_AMBULATORY_CARE_PROVIDER_SITE_OTHER): Payer: BC Managed Care – PPO | Admitting: Gastroenterology

## 2013-04-14 VITALS — BP 138/100 | HR 77 | Ht 65.0 in | Wt 317.4 lb

## 2013-04-14 DIAGNOSIS — Z9079 Acquired absence of other genital organ(s): Secondary | ICD-10-CM

## 2013-04-14 DIAGNOSIS — K5730 Diverticulosis of large intestine without perforation or abscess without bleeding: Secondary | ICD-10-CM

## 2013-04-14 DIAGNOSIS — R110 Nausea: Secondary | ICD-10-CM

## 2013-04-14 DIAGNOSIS — Z90710 Acquired absence of both cervix and uterus: Secondary | ICD-10-CM

## 2013-04-14 DIAGNOSIS — R109 Unspecified abdominal pain: Secondary | ICD-10-CM

## 2013-04-14 DIAGNOSIS — Z86010 Personal history of colonic polyps: Secondary | ICD-10-CM

## 2013-04-14 DIAGNOSIS — R1032 Left lower quadrant pain: Secondary | ICD-10-CM

## 2013-04-14 LAB — CBC WITH DIFFERENTIAL/PLATELET
Basophils Relative: 0.5 % (ref 0.0–3.0)
Eosinophils Absolute: 0.1 10*3/uL (ref 0.0–0.7)
HCT: 40.2 % (ref 36.0–46.0)
Hemoglobin: 13.3 g/dL (ref 12.0–15.0)
MCHC: 33.2 g/dL (ref 30.0–36.0)
MCV: 84 fl (ref 78.0–100.0)
Monocytes Absolute: 0.5 10*3/uL (ref 0.1–1.0)
Neutro Abs: 5.8 10*3/uL (ref 1.4–7.7)
RBC: 4.79 Mil/uL (ref 3.87–5.11)

## 2013-04-14 LAB — HEPATIC FUNCTION PANEL
ALT: 4 U/L (ref 0–35)
AST: 22 U/L (ref 0–37)
Alkaline Phosphatase: 76 U/L (ref 39–117)
Bilirubin, Direct: 0.1 mg/dL (ref 0.0–0.3)
Total Protein: 7.7 g/dL (ref 6.0–8.3)

## 2013-04-14 LAB — FOLATE: Folate: 18.1 ng/mL (ref >5.9)

## 2013-04-14 LAB — BASIC METABOLIC PANEL
CO2: 31 mEq/L (ref 19–32)
Calcium: 9.2 mg/dL (ref 8.4–10.5)

## 2013-04-14 LAB — IBC PANEL
Iron: 49 ug/dL (ref 42–145)
Transferrin: 335.3 mg/dL (ref 212.0–360.0)

## 2013-04-14 MED ORDER — HYDROCODONE-ACETAMINOPHEN 5-325 MG PO TABS: 1.0000 | ORAL_TABLET | Freq: Four times a day (QID) | ORAL | Status: DC | PRN

## 2013-04-14 MED ORDER — HYOSCYAMINE SULFATE 0.125 MG SL SUBL: 0.1250 mg | SUBLINGUAL_TABLET | SUBLINGUAL | Status: DC | PRN

## 2013-04-14 NOTE — Progress Notes (Signed)
This is a obese 47 year old Caucasian female who complains of recurrent left quadrant pain, and apparently she recently had emergency room visit in another city, results of which are unclear at this time.  Cause of her current left lower quadrant pain I did colonoscopy her this year she had very mild diverticulosis and small polyp that was excised.  She continues with mild incomplete rectal emptying, discomfort left lower quadrant, as had no upper GI or hepatobiliary complaints.  There is no real precipitating or alleviating elements to her pain.  She is status post hysterectomy and removal of ovaries within the last 2 years because of a benign ovarian mass.  There is no history of fever, chills, melena or hematochezia.  She also denies any genitourinary or gynecologic problems at this time.  Current Medications, Allergies, Past Medical History, Past Surgical History, Family History and Social History were reviewed in [employer] record.  ROS: All systems were reviewed and are negative unless otherwise stated in the HPI.          Physical Exam: Morbidly obese patient in no acute distress.  Blood pressure 130/100, pulse 77, and weight 317 the BMI 52.82.  Abdominal exam shows no organomegaly, masses, tenderness but exam is difficult.  I felt like I get a good exam of her lower abdominal areas without any specific abnormalities.  Bowel sounds were normal.  Rectal exam is deferred.  Mental status is normal but she is somewhat agitated.     Assessment and Plan: Confusing picture the patient has had several months now of recurrent left lower quadrant pain of unexplained etiology with a fairly negative colonoscopy exam.  She's had multiple gynecologic problems in the past, and I suspect she has pelvic-colonic adhesions accounting for pain syndrome.  I have set her up for CT scan of the abdomen and pelvis, we will check lab data including CBC, CRP and sedimentation rate, metabolic  profile, and I prescribed when necessary sublingual Levsin every 6-8 hours and hydrocodone 5 mg every 6-8 hours for pain pending further evaluation.  I suspect she will need gynecologic referral.  I am not sure she would be able to undergo laparoscopic evaluation because of her obesity. Encounter Diagnoses  Name Primary?  . Abdominal pain, unspecified site Yes  . Nausea alone

## 2013-04-14 NOTE — Patient Instructions (Addendum)
We have sent the following medications to your pharmacy for you to pick up at your convenience: Levsin  We have faxed the following medications to your pharmacy for you to pick up at your convenience: Hydrocodone   Your physician has requested that you go to the basement for the following lab work before leaving today: Sedimentation Rate Liver Function Panel Anemia Profile CRP BMP CBC TSH  You have been scheduled for a CT scan of the abdomen and pelvis at [facility] ([address]---this is in the same building as [facility]).   You are scheduled on 04-17-2013 at 10 am. You should arrive 15 minutes prior to your appointment time for registration. Please follow the written instructions below on the day of your exam:  WARNING: IF YOU ARE ALLERGIC TO IODINE/X-RAY DYE, PLEASE NOTIFY RADIOLOGY IMMEDIATELY AT [phone]! YOU WILL BE GIVEN A 13 HOUR PREMEDICATION PREP.  1) Do not eat or drink anything after 6 am (4 hours prior to your test) 2) You have been given 2 bottles of oral contrast to drink. The solution may taste better if refrigerated, but do NOT add ice or any other liquid to this solution. Shake well before drinking.    Drink 1 bottle of contrast @ 8 am (2 hours prior to your exam)  Drink 1 bottle of contrast @ 9 am (1 hour prior to your exam)  You may take any medications as prescribed with a small amount of water except for the following: Metformin, Glucophage, Glucovance, Avandamet, Riomet, Fortamet, Actoplus Met, Janumet, Glumetza or Metaglip. The above medications must be held the day of the exam AND 48 hours after the exam.  The purpose of you drinking the oral contrast is to aid in the visualization of your intestinal tract. The contrast solution may cause some diarrhea. Before your exam is started, you will be given a small amount of fluid to drink. Depending on your individual set of symptoms, you may also receive an intravenous injection of x-ray  contrast/dye. Plan on being at [facility] for 30 minutes or long, depending on the type of exam you are having performed.  This test typically takes 30-45 minutes to complete.  If you have any questions regarding your exam or if you need to reschedule, you may call the CT department at [phone] between the hours of 8:00 am and 5:00 pm, Monday-Friday.  ________________________________________________________________________                                               We are excited to introduce MyChart, a new best-in-class service that provides you online access to important information in your electronic medical record. We want to make it easier for you to view your health information - all in one secure location - when and where you need it. We expect MyChart will enhance the quality of care and service we provide.  When you register for MyChart, you can:    View your test results.    Request appointments and receive appointment reminders via email.    Request medication renewals.    View your medical history, allergies, medications and immunizations.    Communicate with your physician's office through a password-protected site.    Conveniently print information such as your medication lists.  To find out if MyChart is right for you, please talk to a  member of our clinical staff today. We will gladly answer your questions about this free health and wellness tool.  If you are age 65 or older and want a member of your family to have access to your record, you must provide written consent by completing a proxy form available at our office. Please speak to our clinical staff about guidelines regarding accounts for patients younger than age 48.  As you activate your MyChart account and need any technical assistance, please call the MyChart technical support line at [phone]) or email your question to [email or web address]. If you email your question(s),  please include your name, a return phone number and the best time to reach you.  If you have non-urgent health-related questions, you can send a message to our office through MyChart at [email or web address]. If you have a medical emergency, call 911.  Thank you for using MyChart as your new health and wellness resource!   MyChart licensed from [employer],  [id number]. Patents Pending.

## 2013-04-17 ENCOUNTER — Ambulatory Visit (INDEPENDENT_AMBULATORY_CARE_PROVIDER_SITE_OTHER)
Admission: RE | Admit: 2013-04-17 | Discharge: 2013-04-17 | Disposition: A | Discharge status: Home / Self Care | Payer: BC Managed Care – PPO | Source: Ambulatory Visit | Attending: Gastroenterology | Admitting: Gastroenterology

## 2013-04-17 DIAGNOSIS — R110 Nausea: Secondary | ICD-10-CM

## 2013-04-17 MED ORDER — IOHEXOL 300 MG/ML  SOLN
100.0000 mL | Freq: Once | INTRAMUSCULAR | Status: AC | PRN
  Administered 2013-04-17: 100 mL via INTRAVENOUS

## 2013-04-24 ENCOUNTER — Telehealth: Payer: Self-pay | Admitting: Gastroenterology

## 2013-04-24 NOTE — Telephone Encounter (Signed)
Notes Recorded by Linna Hoff, RN on 04/20/2013 at 9:27 AM lmom for pt to call back. ------  Notes Recorded by Mardella Layman, MD on 04/17/2013 at 11:37 AM Negative exam. Please were heard with Dr. Lily Peer in gynecology for possible laparoscopic evaluation.      Informed pt of the CT results and Dr Norval Gable suggestion for a referral. Pt did not understand why she should see another GYN when she sees Dr Billy Coast. Explained I will send the CT and labs to Dr Billy Coast. Explained she may have adhesions causing adhesions.

## 2013-08-05 ENCOUNTER — Ambulatory Visit: Payer: Self-pay | Admitting: Unknown Physician Specialty

## 2013-08-10 ENCOUNTER — Ambulatory Visit (INDEPENDENT_AMBULATORY_CARE_PROVIDER_SITE_OTHER): Payer: BC Managed Care – PPO | Admitting: Pulmonary Disease

## 2013-08-10 ENCOUNTER — Encounter: Payer: Self-pay | Admitting: Pulmonary Disease

## 2013-08-10 VITALS — BP 124/86 | HR 83 | Ht 66.0 in | Wt 316.0 lb

## 2013-08-10 DIAGNOSIS — R059 Cough, unspecified: Secondary | ICD-10-CM

## 2013-08-10 DIAGNOSIS — K219 Gastro-esophageal reflux disease without esophagitis: Secondary | ICD-10-CM

## 2013-08-10 DIAGNOSIS — R05 Cough: Secondary | ICD-10-CM | POA: Insufficient documentation

## 2013-08-10 MED ORDER — TRAMADOL HCL 50 MG PO TABS: 50.0000 mg | ORAL_TABLET | Freq: Four times a day (QID) | ORAL | Status: DC | PRN

## 2013-08-10 NOTE — Assessment & Plan Note (Signed)
This is contributing to her cough. Acid reflux lifestyle modifications were reviewed. She should continue taking the proton pump inhibitor she is currently taking.

## 2013-08-10 NOTE — Assessment & Plan Note (Signed)
This is multifactorial in nature, but at this point it is due primarily to cyclical cough. Acid reflux and all of her sinus problems are certainly contributing, but the lengthy, harsh cough which she has experienced for several weeks is causing ongoing inflammation the vocal cords perpetuating the cough.  At this point, we need to break the cycle of cough.  I don't think that she has asthma since she has not had much shortness of breath with this and has never been diagnosed with that in the past. However, the [facility] seems to be helping some so I have no reason to stop at this point.  Plan: -Tramadol as needed for the cough (she notes that she has had itching with this in the past, we may need to use something else) -Voice rest for 3 days with active cough suppression -Try using chlorpheniramine instead of Zyrtec for a few days -Follow acid reflux lifestyle modification -Consider simple spirometry on the next visit to rule out or evaluate for asthma

## 2013-08-10 NOTE — Progress Notes (Signed)
Subjective:    Patient ID: Alicia Bentley, female    DOB: 08-17-1966, 47 y.o.   MRN: [id number]  HPI  This is a very pleasant 47 year old female who comes to our clinic today for evaluation of cough. She has a past medical history significant for allergic rhinitis and chronic sinusitis requiring sinus surgeries in the past. She's been treated with immunotherapy as a child for allergic rhinitis but has never been diagnosed with asthma. She notes that she's never really had any respiratory problems with the exception of the occasional episode of bronchitis and one episode of pneumonia. Several weeks back she developed a respiratory infection associated with chest congestion, cough, sputum production and some sinus symptoms. She was seen by her ear nose and throat physician who treated her with prednisone, Tussionex, Levaquin, and an inhaler. The sputum production resolved but a harsh, "barking" cough has persisted ever since. She states that she has been given a Dulera inhaler as well as Biaxin one week ago and was referred to [place] for further evaluation. For the last couple weeks the cough has continued and has been nonproductive. It is associated with hoarseness and a tickling sensation in her throat. She notes ongoing postnasal drip with sinus congestion and it is possible that she may need to have tympanoplasty in the near future. She has shortness of breath only after severe coughing episodes. When she is asleep she does not wake up with hacking cough. She does have some acid reflux and takes a proton pump inhibitor for this and tries to watch out for foods that cause heartburn.  Apparently on laryngoscopy last week there was "a tear in her esophagus" which was noted from coughing so much.   Past Medical History  Diagnosis Date  . Colon polyps 2010    Tubular Adenoma   . Unspecified urinary incontinence   . DJD (degenerative joint disease)   . Headache(784.0)   . Unspecified essential hypertension    . Panic disorder   . GERD (gastroesophageal reflux disease)   . Hiatal hernia   . Irritable bowel syndrome   . Diverticulosis of colon (without mention of hemorrhage)      Family History  Problem Relation Age of Onset  . Heart disease Mother   . Diabetes Father   . Heart disease Father   . Dementia Father      History   Social History  . Marital Status: Married    Spouse Name: N/A    Number of Children: 1  . Years of Education: N/A   Occupational History  . housewife    Social History Main Topics  . Smoking status: Never Smoker   . Smokeless tobacco: Never Used  . Alcohol Use: No  . Drug Use: No  . Sexual Activity: Not on file   Other Topics Concern  . Not on file   Social History Narrative  . No narrative on file     Allergies  Allergen Reactions  . Albuterol     REACTION: makes heart beat faster  . Tramadol Itching     Outpatient Prescriptions Prior to Visit  Medication Sig Dispense Refill  . aspirin 81 MG tablet Take 81 mg by mouth daily.      . cetirizine (ZYRTEC) 10 MG tablet Take 10 mg by mouth daily.      Marland Kitchen dexlansoprazole (DEXILANT) 60 MG capsule Take 60 mg by mouth daily.      Marland Kitchen estrogen-methylTESTOSTERone 0.625-1.25 MG per tablet Take 1 tablet by mouth  daily.      . metaxalone (SKELAXIN) 800 MG tablet Take 800 mg by mouth 3 (three) times daily.      . Omega-3 Fatty Acids (FISH OIL) 1200 MG CAPS Take 2 capsules by mouth daily.      Marland Kitchen triamterene-hydrochlorothiazide (MAXZIDE) 75-50 MG per tablet Take 1 tablet by mouth daily.      Marland Kitchen HYDROcodone-acetaminophen (NORCO/VICODIN) 5-325 MG per tablet Take 1 tablet by mouth every 6 (six) hours as needed for pain.  30 tablet  0  . Azelastine-Fluticasone (DYMISTA) 137-50 MCG/ACT SUSP Place 2 puffs into the nose daily.      . hyoscyamine (LEVSIN/SL) 0.125 MG SL tablet Place 1 tablet (0.125 mg total) under the tongue every 4 (four) hours as needed for cramping.  30 tablet  0   No facility-administered  medications prior to visit.      Review of Systems  Constitutional: Negative for fever, chills, diaphoresis, activity change, appetite change, fatigue and unexpected weight change.  HENT: Positive for congestion, ear pain, sneezing and sore throat. Negative for dental problem, ear discharge, facial swelling, hearing loss, mouth sores, nosebleeds, postnasal drip, rhinorrhea, sinus pressure, tinnitus, trouble swallowing and voice change.   Eyes: Negative for photophobia, discharge, itching and visual disturbance.  Respiratory: Positive for cough and shortness of breath. Negative for apnea, choking, chest tightness, wheezing and stridor.   Cardiovascular: Negative for chest pain, palpitations and leg swelling.  Gastrointestinal: Negative for nausea, vomiting, abdominal pain, constipation, blood in stool and abdominal distention.  Genitourinary: Negative for dysuria, urgency, frequency, hematuria, flank pain, decreased urine volume and difficulty urinating.  Musculoskeletal: Negative for arthralgias, back pain, gait problem, joint swelling, myalgias, neck pain and neck stiffness.  Skin: Negative for color change, pallor and rash.  Neurological: Negative for dizziness, tremors, seizures, syncope, speech difficulty, weakness, light-headedness, numbness and headaches.  Hematological: Negative for adenopathy. Does not bruise/bleed easily.  Psychiatric/Behavioral: Negative for confusion, sleep disturbance and agitation. The patient is not nervous/anxious.        Objective:   Physical Exam  Filed Vitals:   08/10/13 1618  BP: 124/86  Pulse: 83  Height: 5\' 6"  (1.676 m)  Weight: 316 lb (143.337 kg)  SpO2: 99%  RA  Gen: obese, well appearing, no acute distress HEENT: NCAT, PERRL, EOMi, OP clear, neck supple without masses PULM: faint exp wheezing upper airway only with forced exhalation; otherwise CTA B CV: RRR, no mgr, no JVD AB: BS+, soft, nontender, no hsm Ext: warm, some pretibial edema,  no clubbing, no cyanosis Derm: no rash or skin breakdown Neuro: A&Ox4, CN II-XII intact, strength 5/5 in all 4 extremities      Assessment & Plan:   Cough This is multifactorial in nature, but at this point it is due primarily to cyclical cough. Acid reflux and all of her sinus problems are certainly contributing, but the lengthy, harsh cough which she has experienced for several weeks is causing ongoing inflammation the vocal cords perpetuating the cough.  At this point, we need to break the cycle of cough.  I don't think that she has asthma since she has not had much shortness of breath with this and has never been diagnosed with that in the past. However, the [facility] seems to be helping some so I have no reason to stop at this point.  Plan: -Tramadol as needed for the cough (she notes that she has had itching with this in the past, we may need to use something else) -Voice rest  for 3 days with active cough suppression -Try using chlorpheniramine instead of Zyrtec for a few days -Follow acid reflux lifestyle modification -Consider simple spirometry on the next visit to rule out or evaluate for asthma  GERD This is contributing to her cough. Acid reflux lifestyle modifications were reviewed. She should continue taking the proton pump inhibitor she is currently taking.   Updated Medication List Outpatient Encounter Prescriptions as of 08/10/2013  Medication Sig  . aspirin 81 MG tablet Take 81 mg by mouth daily.  . cetirizine (ZYRTEC) 10 MG tablet Take 10 mg by mouth daily.  Marland Kitchen dexlansoprazole (DEXILANT) 60 MG capsule Take 60 mg by mouth daily.  Marland Kitchen estrogen-methylTESTOSTERone 0.625-1.25 MG per tablet Take 1 tablet by mouth daily.  . meloxicam (MOBIC) 15 MG tablet Take 15 mg by mouth daily.  . metaxalone (SKELAXIN) 800 MG tablet Take 800 mg by mouth 3 (three) times daily.  . mometasone-formoterol (DULERA) 100-5 MCG/ACT AERO Inhale 2 puffs into the lungs 2 (two) times daily.  . Omega-3  Fatty Acids (FISH OIL) 1200 MG CAPS Take 2 capsules by mouth daily.  Marland Kitchen PROAIR HFA 108 (90 BASE) MCG/ACT inhaler Inhale 2 puffs into the lungs every 6 (six) hours as needed.  . triamterene-hydrochlorothiazide (MAXZIDE) 75-50 MG per tablet Take 1 tablet by mouth daily.  . [DISCONTINUED] HYDROcodone-acetaminophen (NORCO/VICODIN) 5-325 MG per tablet Take 1 tablet by mouth every 6 (six) hours as needed for pain.  . traMADol (ULTRAM) 50 MG tablet Take 1 tablet (50 mg total) by mouth every 6 (six) hours as needed (cough).  . [DISCONTINUED] Azelastine-Fluticasone (DYMISTA) 137-50 MCG/ACT SUSP Place 2 puffs into the nose daily.  . [DISCONTINUED] hyoscyamine (LEVSIN/SL) 0.125 MG SL tablet Place 1 tablet (0.125 mg total) under the tongue every 4 (four) hours as needed for cramping.

## 2013-08-10 NOTE — Patient Instructions (Signed)
You need to break the cough cycle  Chronic cough, or cough lasting longer than 8 weeks, can be caused by many different conditions, including smoking, asthma, sinus congestion/runny nose, acid reflux or medications.  You need to pick three days where you don't talk, sing, or laugh.  You need to try to suppress your cough during this time.  Take tramadol every 6 hours as needed for cough. Swallowing water or using ice chips/non mint and menthol containing candies (such as lifesavers or sugarless jolly ranchers) are also effective. You should rest your voice and avoid activities that you know make you cough.   Once you have eliminated the cough for 3 straight days try reducing the tramadol as tolerated.   If you have a lot of itching call us so we can try something else for the cough.  You can also use chlorpheniramine over the counter to help with the cough.    Follow the GERD lifestyle sheet we gave you  We will see you back in 4 weeks or sooner if needed

## 2013-09-21 ENCOUNTER — Ambulatory Visit: Payer: BC Managed Care – PPO | Admitting: Pulmonary Disease

## 2014-07-12 IMAGING — CT CT ABD-PELV W/ CM
2 of 5 series · 17 of 46 positions shown, 19 images · IV contrast (Omnipaque 300)
Comparison: 09/08/2003

CLINICAL DATA: Abdominal pain, nausea, chronic constipation, prior
diverticulitis

CT ABDOMEN AND PELVIS WITH CONTRAST
TECHNIQUE: Multidetector CT imaging of the abdomen and pelvis was
performed following the standard protocol during bolus
administration of intravenous contrast.
Contrast: 100mL OMNIPAQUE IOHEXOL 300 MG/ML  SOLN

[Series 2: abd/ pel 5mm · axial · 0.98mm/px · z∈[-534,-70]mm · 14 of 105 slices shown, 16 images]
[im 6/105  soft-tissue]
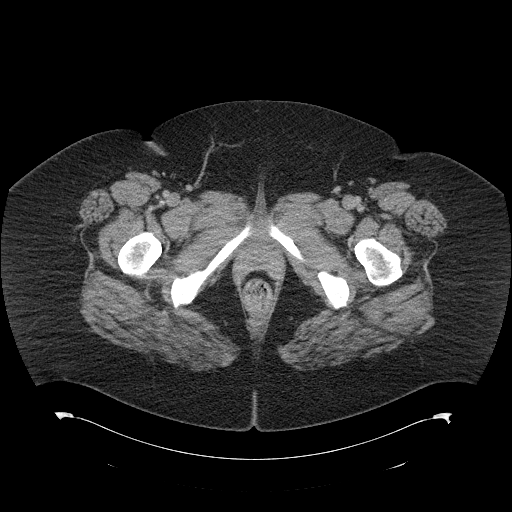
[im 6/105  bone]
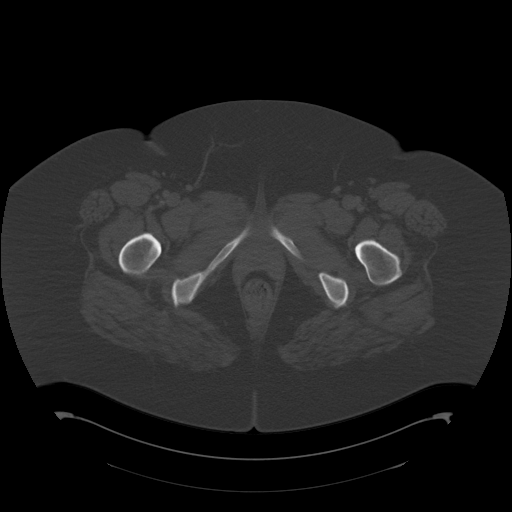
[im 16/105  soft-tissue]
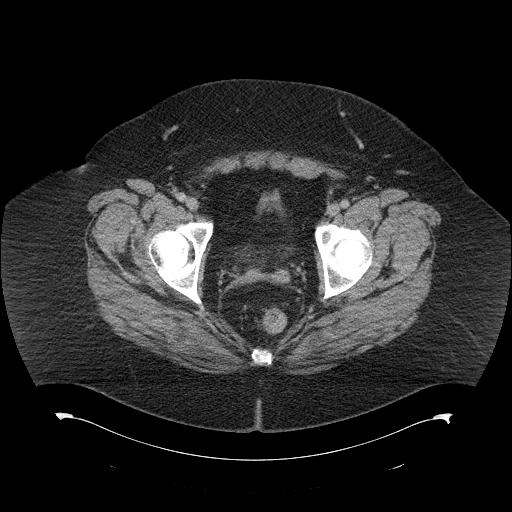
[im 21/105  soft-tissue]
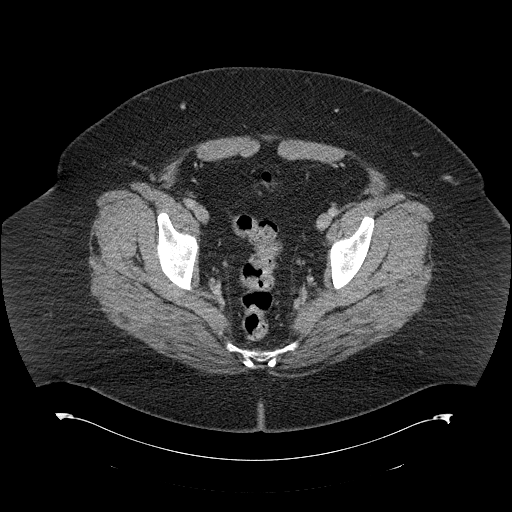
[im 27/105  soft-tissue]
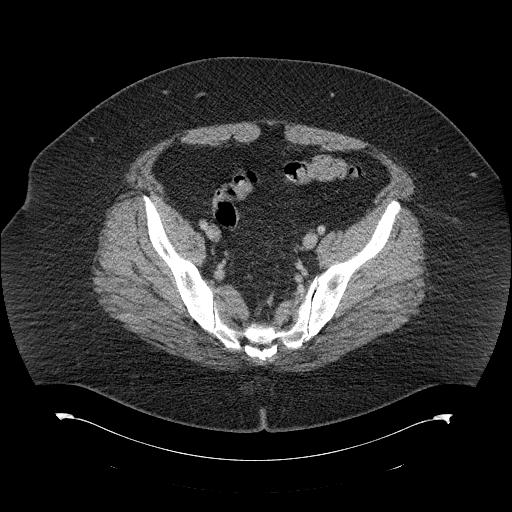
[im 37/105  soft-tissue]
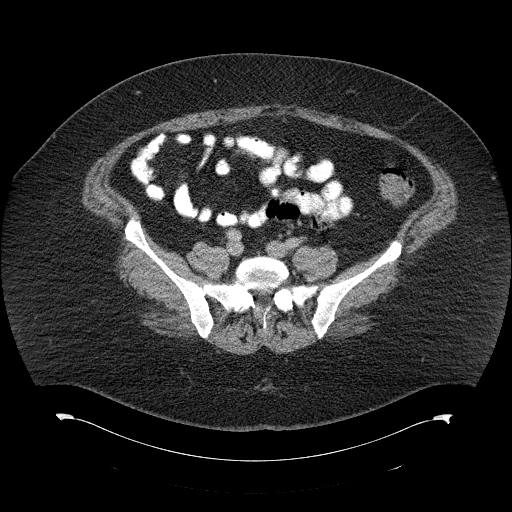
[im 42/105  soft-tissue]
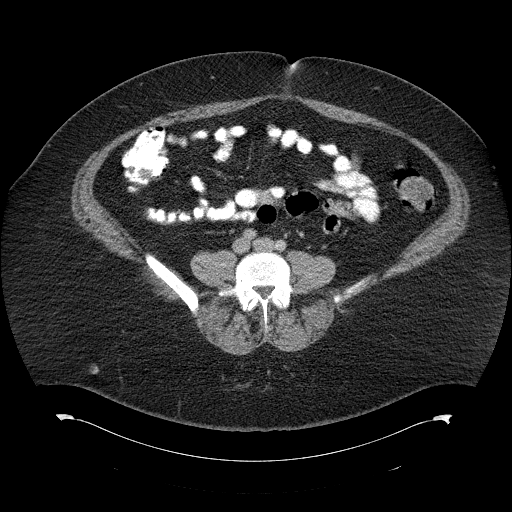
[im 47/105  soft-tissue]
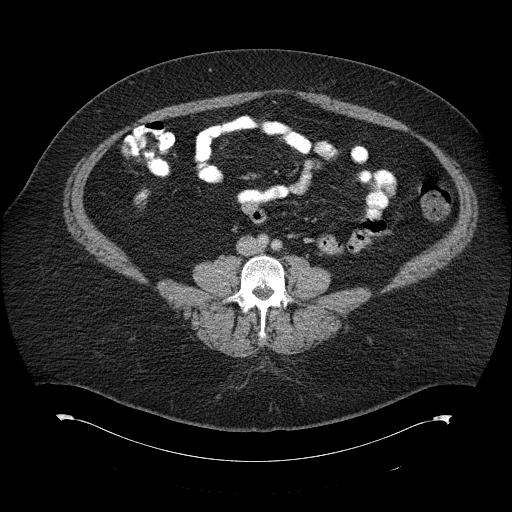
[im 58/105  soft-tissue]
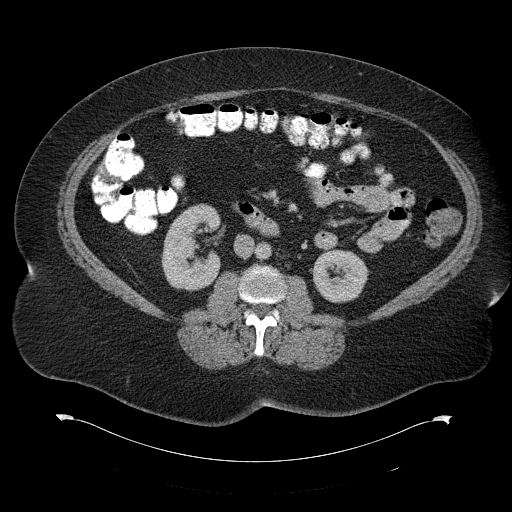
[im 63/105  soft-tissue]
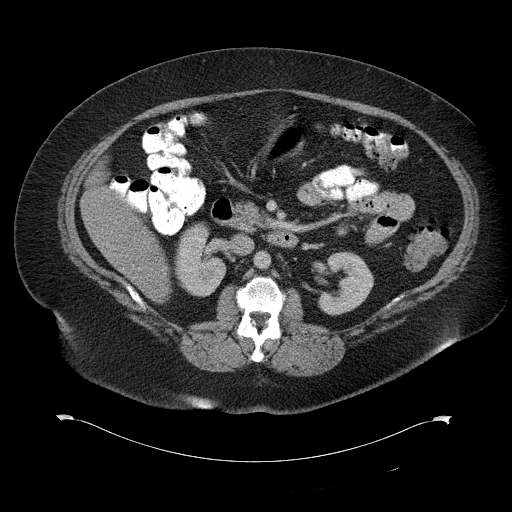
[im 63/105  bone]
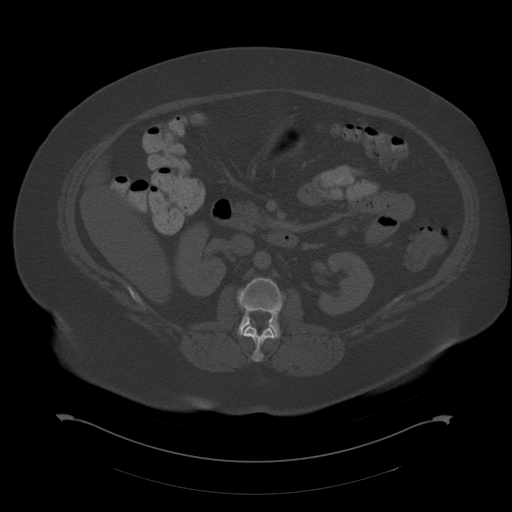
[im 68/105  soft-tissue]
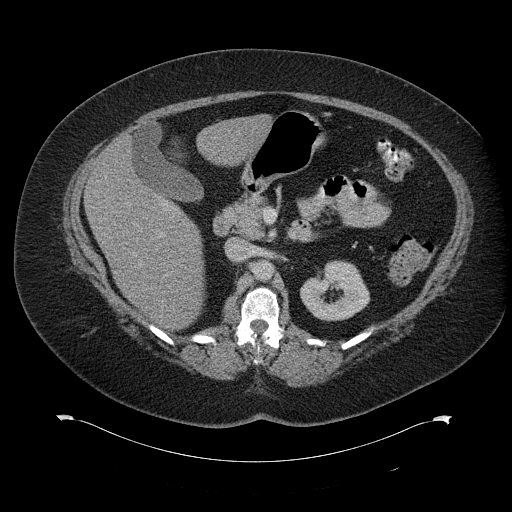
[im 79/105  soft-tissue]
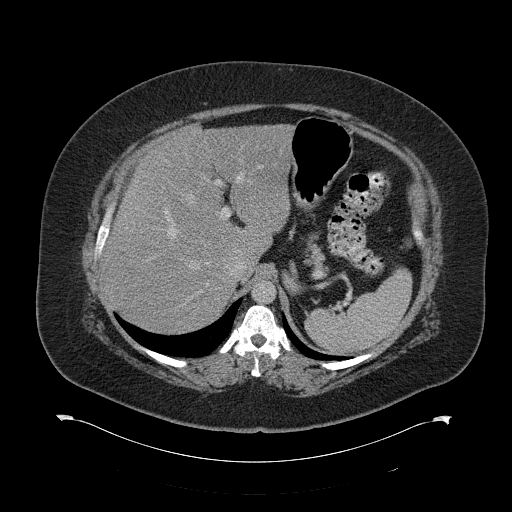
[im 84/105  soft-tissue]
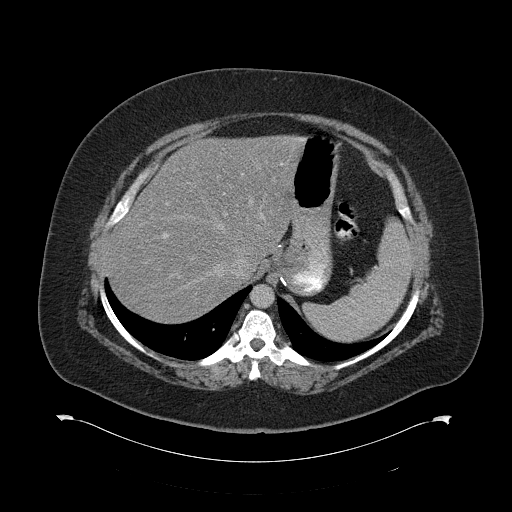
[im 89/105  soft-tissue]
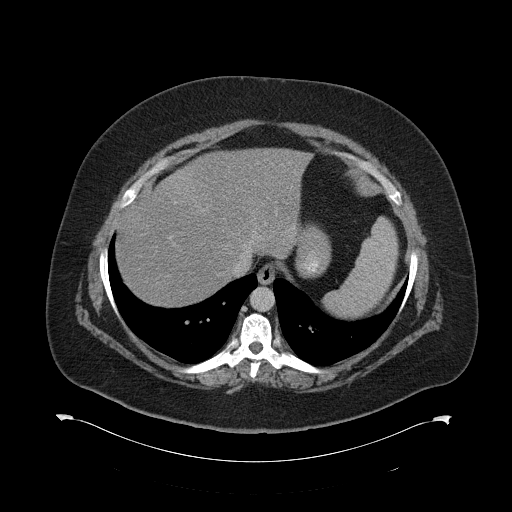
[im 99/105  soft-tissue]
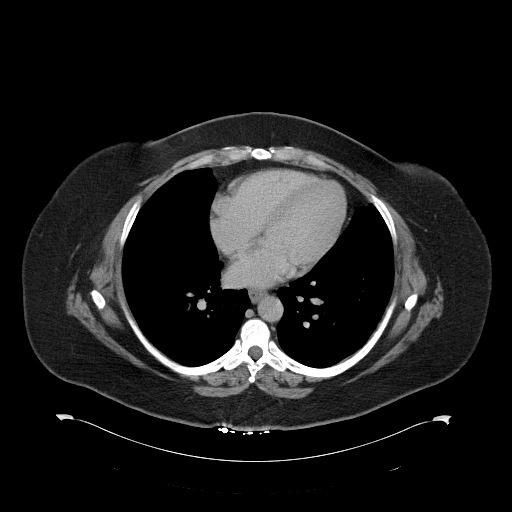

[Series 602: cor · coronal · 1.05mm/px · 3 of 146 slices shown]
[im 49/146  soft-tissue]
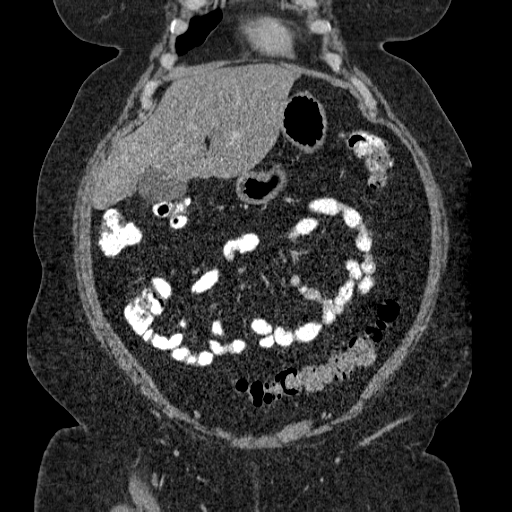
[im 65/146  soft-tissue]
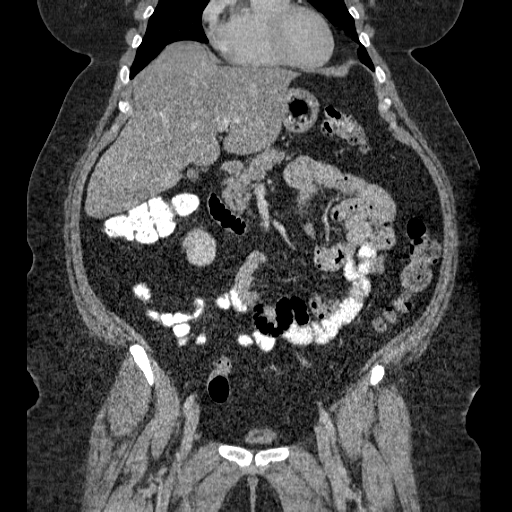
[im 81/146  soft-tissue]
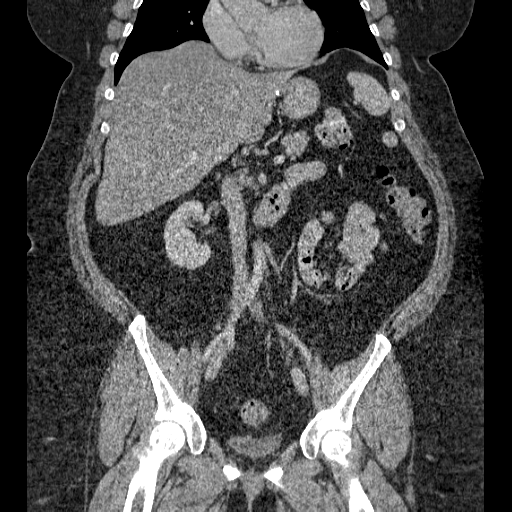

[17 of 46 positions shown; findings below may reference images not displayed]

FINDINGS: Clear lung bases.  Normal heart size.  No pericardial or
pleural effusion.

Abdomen:  Postop changes at the gastric fundus, suspect prior
Nissen fundoplication.  No hiatal hernia evident.

Diffuse hypoattenuation of the liver parenchyma compatible with
hepatic steatosis.  No definite focal hepatic abnormality or
biliary dilatation.  Hepatic and portal veins are patent.
Gallbladder, biliary system, pancreas, spleen, accessory splenule,
adrenal glands, and kidneys are within normal limits for age and
demonstrate no acute finding.

Negative for bowel obstruction, dilatation, ileus pattern, or free
air.

No abdominal free fluid, fluid collection, hemorrhage, adenopathy,
or abscess.

Pelvis:  No pelvic free fluid, fluid collection, hemorrhage,
abscess, adenopathy, inguinal abnormality, or hernia.  Urinary
bladder is collapsed.  Prior hysterectomy evident.  No acute distal
bowel process.

Minor degenerative changes of the spine diffusely.  Right gluteal
injection calcified granuloma noted.
IMPRESSION: No acute intra-abdominal or pelvic process.

Postop changes as described.

Hepatic steatosis.

## 2014-07-22 ENCOUNTER — Other Ambulatory Visit: Payer: Self-pay

## 2014-07-22 DIAGNOSIS — Z1239 Encounter for other screening for malignant neoplasm of breast: Secondary | ICD-10-CM

## 2014-08-17 ENCOUNTER — Ambulatory Visit
Admission: RE | Admit: 2014-08-17 | Discharge: 2014-08-17 | Disposition: A | Discharge status: Home / Self Care | Payer: BC Managed Care – PPO | Source: Ambulatory Visit

## 2014-08-17 ENCOUNTER — Other Ambulatory Visit: Payer: Self-pay

## 2014-08-17 DIAGNOSIS — Z1231 Encounter for screening mammogram for malignant neoplasm of breast: Secondary | ICD-10-CM

## 2015-07-14 ENCOUNTER — Other Ambulatory Visit: Payer: Self-pay

## 2015-07-14 DIAGNOSIS — Z1231 Encounter for screening mammogram for malignant neoplasm of breast: Secondary | ICD-10-CM

## 2015-08-19 ENCOUNTER — Ambulatory Visit
Admission: RE | Admit: 2015-08-19 | Discharge: 2015-08-19 | Disposition: A | Discharge status: Home / Self Care | Payer: BC Managed Care – PPO | Source: Ambulatory Visit

## 2015-08-19 DIAGNOSIS — Z1231 Encounter for screening mammogram for malignant neoplasm of breast: Secondary | ICD-10-CM

## 2015-08-22 ENCOUNTER — Encounter: Payer: Self-pay | Admitting: Gastroenterology

## 2015-12-13 ENCOUNTER — Telehealth: Payer: Self-pay | Admitting: *Deleted

## 2015-12-13 NOTE — Telephone Encounter (Signed)
error 

## 2016-02-14 ENCOUNTER — Ambulatory Visit: Payer: BC Managed Care – PPO | Admitting: Cardiovascular Disease

## 2016-02-14 NOTE — Progress Notes (Signed)
Patient ID: Alicia Bentley, female   DOB: 01/11/1966, 50 y.o.   MRN: [id number]     Cardiology Office Note   Date:  02/16/2016   ID:  Alicia Bentley, DOB [birth year], MRN [id number]  PCP:  Reginia Naas, MD  Cardiologist:   Jenkins Rouge, MD   No chief complaint on file.     History of Present Illness: Alicia Bentley is a 50 y.o. female who presents for evaluation of chest pain.  CRF HTN.  Recently seen by pulmonary for cough and GERD.  She is severely depressed and was crying for most of the  Interview. Symptoms started when her 45 yo father passed away. He had been in a nursing home [place] the past 8 years. Her pain is nearly constant She has a hard time taking a deep breath.  Pain radiates to neck and arm. No pleuritic component no history of DVT.  She is sedentary.  Has been started on anxiolytic and anti depressant but still labile. BP has been up and started on ARB Pains and dyspnea have not gone away since fathers death     Past Medical History  Diagnosis Date  . Colon polyps 2010    Tubular Adenoma   . Unspecified urinary incontinence   . DJD (degenerative joint disease)   . Headache(784.0)   . Unspecified essential hypertension   . Panic disorder   . GERD (gastroesophageal reflux disease)   . Hiatal hernia   . Irritable bowel syndrome   . Diverticulosis of colon (without mention of hemorrhage)     Past Surgical History  Procedure Laterality Date  . Nissen fundoplication    . Nasal sinus surgery    . Tympanostomy tube placement    . Abdominal hysterectomy    . Colonoscopy N/A 12/29/2012    Procedure: COLONOSCOPY;  Surgeon: Sable Feil, MD;  Location: [facility];  Service: Endoscopy;  Laterality: N/A;     Current Outpatient Prescriptions  Medication Sig Dispense Refill  . aspirin 81 MG tablet Take 81 mg by mouth daily.    . cetirizine (ZYRTEC) 10 MG tablet Take 10 mg by mouth daily.    Marland Kitchen dexlansoprazole (DEXILANT) 60 MG capsule Take 60 mg by mouth  daily.    Marland Kitchen estrogen-methylTESTOSTERone 0.625-1.25 MG per tablet Take 1 tablet by mouth daily.    . meloxicam (MOBIC) 15 MG tablet Take 15 mg by mouth daily.    . metaxalone (SKELAXIN) 800 MG tablet Take 800 mg by mouth 3 (three) times daily.    . mometasone-formoterol (DULERA) 100-5 MCG/ACT AERO Inhale 2 puffs into the lungs 2 (two) times daily.    . Omega-3 Fatty Acids (FISH OIL) 1200 MG CAPS Take 2 capsules by mouth daily.    Marland Kitchen PROAIR HFA 108 (90 BASE) MCG/ACT inhaler Inhale 2 puffs into the lungs every 6 (six) hours as needed.    . traMADol (ULTRAM) 50 MG tablet Take 1 tablet (50 mg total) by mouth every 6 (six) hours as needed (cough). 40 tablet 0  . triamterene-hydrochlorothiazide (MAXZIDE) 75-50 MG per tablet Take 1 tablet by mouth daily.     No current facility-administered medications for this visit.    Allergies:   Albuterol; Codeine; and Tramadol    Social History:  The patient  reports that she has never smoked. She has never used smokeless tobacco. She reports that she does not drink alcohol or use illicit drugs.   Family History:  The patient's family history includes Dementia in her father;  Diabetes in her father; Heart disease in her father and mother.    ROS:  Please see the history of present illness.   Otherwise, review of systems are positive for none.   All other systems are reviewed and negative.    PHYSICAL EXAM: VS:  BP 130/80 mmHg  Pulse 74  Ht 5\' 4"  (1.626 m)  Wt 143.246 kg (315 lb 12.8 oz)  BMI 54.18 kg/m2 , BMI Body mass index is 54.18 kg/(m^2). Affect appropriate Healthy:  appears stated age 30: normal Neck supple with no adenopathy JVP normal no bruits no thyromegaly Lungs clear with no wheezing and good diaphragmatic motion Heart:  S1/S2 no murmur, no rub, gallop or click PMI normal Abdomen: benighn, BS positve, no tenderness, no AAA no bruit.  No HSM or HJR Distal pulses intact with no bruits No edema Neuro non-focal Skin warm and dry No  muscular weakness    EKG:  NSR normal ECG rate 74    Recent Labs: No results found for requested labs within last 365 days.    Lipid Panel No results found for: CHOL, TRIG, HDL, CHOLHDL, VLDL, LDLCALC, LDLDIRECT    Wt Readings from Last 3 Encounters:  02/16/16 143.246 kg (315 lb 12.8 oz)  08/10/13 143.337 kg (316 lb)  04/14/13 143.972 kg (317 lb 6.4 oz)      Other studies Reviewed: Additional studies/ records that were reviewed today include: Primary care notes .    ASSESSMENT AND PLAN:  1.  Chest Pain atypical Sounds like she has a "broken heart" ECG and exam normal She thinks she can walk on a treadmill f/u Ex. Myovue 2. HTN:  Continue ARB weight loss and low sodium diet 3. Dyspnea:  Related to anxiety lungs clear on exam EF will be checked on stress test 4. Depression: continue skelexin consider adding BZ f/u primary    Current medicines are reviewed at length with the patient today.  The patient does not have concerns regarding medicines.  The following changes have been made:  no change  Labs/ tests ordered today include: Exercise myovue   No orders of the defined types were placed in this encounter.     Disposition:   FU with primary Cards PRN      Signed, Jenkins Rouge, MD  02/16/2016 2:18 PM    [facility] [place], [place], [place]  [address] Phone: [phone]; Fax: [phone]

## 2016-02-16 ENCOUNTER — Ambulatory Visit (INDEPENDENT_AMBULATORY_CARE_PROVIDER_SITE_OTHER): Payer: BC Managed Care – PPO | Admitting: Cardiovascular Disease

## 2016-02-16 ENCOUNTER — Encounter: Payer: Self-pay | Admitting: Cardiovascular Disease

## 2016-02-16 VITALS — BP 130/80 | HR 74 | Ht 64.0 in | Wt 315.8 lb

## 2016-02-16 DIAGNOSIS — R0789 Other chest pain: Secondary | ICD-10-CM

## 2016-02-16 NOTE — Patient Instructions (Signed)
Medication Instructions:  Your physician recommends that you continue on your current medications as directed. Please refer to the Current Medication list given to you today.  Labwork: NONE  Testing/Procedures: Your physician has requested that you have en exercise stress myoview. For further information please visit [email or web address]. Please follow instruction sheet, as given  Follow-Up: Your physician wants you to follow-up as needed with Dr. Nishan.   If you need a refill on your cardiac medications before your next appointment, please call your pharmacy.    

## 2016-02-27 ENCOUNTER — Telehealth (HOSPITAL_COMMUNITY): Payer: Self-pay | Admitting: *Deleted

## 2016-02-27 NOTE — Telephone Encounter (Signed)
Patient given detailed instructions per Myocardial Perfusion Study Information Sheet for the test on 02/29/16. Patient notified to arrive 15 minutes early and that it is imperative to arrive on time for appointment to keep from having the test rescheduled.  If you need to cancel or reschedule your appointment, please call the office within 24 hours of your appointment. Failure to do so may result in a cancellation of your appointment, and a $50 no show fee. Patient verbalized understanding. Johann Gascoigne J Briani Maul, RN   

## 2016-02-29 ENCOUNTER — Ambulatory Visit (HOSPITAL_COMMUNITY): Payer: BC Managed Care – PPO | Attending: Cardiovascular Disease

## 2016-02-29 DIAGNOSIS — R0789 Other chest pain: Secondary | ICD-10-CM | POA: Diagnosis present

## 2016-02-29 DIAGNOSIS — I10 Essential (primary) hypertension: Secondary | ICD-10-CM | POA: Insufficient documentation

## 2016-02-29 DIAGNOSIS — R0609 Other forms of dyspnea: Secondary | ICD-10-CM | POA: Diagnosis not present

## 2016-02-29 MED ORDER — TECHNETIUM TC 99M TETROFOSMIN IV KIT
30.6000 | PACK | Freq: Once | INTRAVENOUS | Status: AC | PRN
  Administered 2016-02-29: 31 via INTRAVENOUS
  Filled 2016-02-29: qty 31

## 2016-03-01 ENCOUNTER — Ambulatory Visit (HOSPITAL_COMMUNITY): Payer: BC Managed Care – PPO | Attending: Cardiovascular Disease

## 2016-03-01 DIAGNOSIS — R0789 Other chest pain: Secondary | ICD-10-CM | POA: Diagnosis not present

## 2016-03-01 LAB — MYOCARDIAL PERFUSION IMAGING
CHL CUP NUCLEAR SDS: 2
CHL CUP NUCLEAR SSS: 4
CSEPPHR: 111 {beats}/min
LHR: 0.29
LVDIAVOL: 110 mL (ref 46–106)
LVSYSVOL: 46 mL
Rest HR: 74 {beats}/min
SRS: 2
TID: 0.92

## 2016-03-01 MED ORDER — TECHNETIUM TC 99M TETROFOSMIN IV KIT
33.0000 | PACK | Freq: Once | INTRAVENOUS | Status: AC | PRN
  Administered 2016-03-01: 33 via INTRAVENOUS
  Filled 2016-03-01: qty 33

## 2016-03-01 MED ORDER — REGADENOSON 0.4 MG/5ML IV SOLN
0.4000 mg | Freq: Once | INTRAVENOUS | Status: AC
  Administered 2016-03-01: 0.4 mg via INTRAVENOUS

## 2016-03-08 ENCOUNTER — Telehealth: Payer: Self-pay | Admitting: Cardiovascular Disease

## 2016-03-08 NOTE — Telephone Encounter (Signed)
Follow-up     The pt is returning Pam's call. About the stress test results.

## 2016-03-08 NOTE — Telephone Encounter (Signed)
Patient aware of stress test.

## 2016-05-31 ENCOUNTER — Encounter: Payer: Self-pay | Admitting: Neurology

## 2016-05-31 ENCOUNTER — Ambulatory Visit (INDEPENDENT_AMBULATORY_CARE_PROVIDER_SITE_OTHER): Payer: BC Managed Care – PPO | Admitting: Neurology

## 2016-05-31 VITALS — BP 142/100 | HR 68 | Resp 20 | Ht 65.0 in | Wt 321.0 lb

## 2016-05-31 DIAGNOSIS — E669 Obesity, unspecified: Secondary | ICD-10-CM | POA: Diagnosis not present

## 2016-05-31 DIAGNOSIS — B0229 Other postherpetic nervous system involvement: Secondary | ICD-10-CM | POA: Diagnosis not present

## 2016-05-31 MED ORDER — PREGABALIN 75 MG PO CAPS: 75.0000 mg | ORAL_CAPSULE | Freq: Two times a day (BID) | ORAL | 5 refills | Status: DC

## 2016-05-31 NOTE — Progress Notes (Signed)
Provider:  Larey Seat, M D  Referring Provider: Carol Ada, MD Primary Care Physician:  Reginia Naas, MD  Chief Complaint  Patient presents with  . New Patient (Initial Visit)    scalp pain since June, started with a migraine    HPI:  Alicia Bentley is a 50 y.o. female , seen here as a referral  from Dr. Tamala Julian for an abnormal scalp sensation.   Chief complaint according to patient : " I had shingles at the back of my head" ,   Alicia Bentley whom I'm have last seen 12 years ago reported that her father died of Pneumonia and CHF in Dec 19, 2022 of this year. She developed anxiety manifesting and palpitations but cardiology workup was negative. About June of this year she developed an abnormal burning back of the head a severe coronal headache, but became so intense that her husband had to bring her to the local emergency room in [place] after she was nauseated , too. She characterizes the sensation as burning, itching and tingling. There was a stroke workup undergone during the emergency room stay, which thankfully returned negative. She only learned that she had a polyp in her right maxillary sinus. When she was physically evaluated, the emergency room physician noted that she had shingles she had also neck stiffness and some blisters that had already healed over the occipital nerve distribution. This would have involved the left occiput and the distribution of the second cervical nerve.   Her primary care physician tried first gabapentin with her, which seems not to have worked at a dose of 100 mg tid. She was on gabapentin for leg started burning and tingling 2. Dr. Tamala Julian changed her from gabapentin to pregabalin. She received samples for 50 mg and this has helped controlled the dysesthesias. Her primary care physician still questioned if all these sensations can be related to the shingles or if another processes going on. Her headaches have decreased but are not gone.    She is currently taking 81 mg aspirin, fish oil, Zyrtec and milligrams, dye Mr., losartan, excellent, citalopram, Ativan, she was placed on Ativan after the death of her father when she developed panic attacks and anxiety. She is also on triamterene, hydrochlorothiazide, meloxicam as needed, visit and if needed, Zofran for nausea as needed, hyoscyamine for irritable bowel.  Medical history: Morbid obese, HTN, depression and anxiety,   Social history: married, no ETOH, No tobacco, Tea rarely ( when going out ), no SODA and only one cup of coffee( if not drinking tea)   Review of Systems: Out of a complete 14 system review, the patient complains of only the following symptoms, and all other reviewed systems are negative.  anxiety, palpitation, non cardiac, panic, insomnia after the death of her father.    Social History   Social History  . Marital status: Married    Spouse name: N/A  . Number of children: 1  . Years of education: N/A   Occupational History  . housewife    Social History Main Topics  . Smoking status: Never Smoker  . Smokeless tobacco: Never Used  . Alcohol use No  . Drug use: No  . Sexual activity: Not on file   Other Topics Concern  . Not on file   Social History Narrative  . No narrative on file    Family History  Problem Relation Age of Onset  . Heart disease Mother   . Diabetes Father   .  Heart disease Father   . Dementia Father   . Stroke Father     Past Medical History:  Diagnosis Date  . Colon polyps 2010   Tubular Adenoma   . Diverticulosis of colon (without mention of hemorrhage)   . DJD (degenerative joint disease)   . GERD (gastroesophageal reflux disease)   . Headache(784.0)   . Hiatal hernia   . Irritable bowel syndrome   . Miscarriage   . Panic disorder   . Unspecified essential hypertension   . Unspecified urinary incontinence     Past Surgical History:  Procedure Laterality Date  . ABDOMINAL HYSTERECTOMY    .  COLONOSCOPY N/A 12/29/2012   Procedure: COLONOSCOPY;  Surgeon: Sable Feil, MD;  Location: [facility];  Service: Endoscopy;  Laterality: N/A;  . NASAL SINUS SURGERY    . NISSEN FUNDOPLICATION    . TYMPANOSTOMY TUBE PLACEMENT      Current Outpatient Prescriptions  Medication Sig Dispense Refill  . aspirin 81 MG tablet Take 81 mg by mouth daily.    . Azelastine-Fluticasone 137-50 MCG/ACT SUSP by Each Nare route.    . cetirizine (ZYRTEC) 10 MG tablet Take 10 mg by mouth daily.    . citalopram (CELEXA) 20 MG tablet Take 1 tablet by mouth daily.  2  . dexlansoprazole (DEXILANT) 60 MG capsule Take 60 mg by mouth daily.    Marland Kitchen LORazepam (ATIVAN) 0.5 MG tablet Take 0.5 mg by mouth every 6 (six) hours as needed.  0  . losartan (COZAAR) 50 MG tablet Take 50 mg by mouth daily.    . meloxicam (MOBIC) 15 MG tablet Take 15 mg by mouth daily.    . metaxalone (SKELAXIN) 800 MG tablet Take 800 mg by mouth 3 (three) times daily.    . Meth-Hyo-M Bl-Na Phos-Ph Sal (URO-MP) 118 MG CAPS Take 1 capsule by mouth daily.  11  . Omega-3 Fatty Acids (FISH OIL) 1200 MG CAPS Take 2 capsules by mouth daily.    . pregabalin (LYRICA) 50 MG capsule Take 50 mg by mouth 2 (two) times daily.    . sertraline (ZOLOFT) 50 MG tablet Take 50 mg by mouth daily.    Marland Kitchen triamterene-hydrochlorothiazide (MAXZIDE) 75-50 MG per tablet Take 1 tablet by mouth daily.     No current facility-administered medications for this visit.     Allergies as of 05/31/2016 - Review Complete 05/31/2016  Allergen Reaction Noted  . Albuterol    . Codeine Itching 02/16/2016  . Tramadol Itching 12/25/2012  . Escitalopram oxalate Itching 11/28/2014    Vitals: BP (!) 142/100   Pulse 68   Resp 20   Ht 5\' 5"  (1.651 m)   Wt (!) 321 lb (145.6 kg)   BMI 53.42 kg/m  Last Weight:  Wt Readings from Last 1 Encounters:  05/31/16 (!) 321 lb (145.6 kg)   [id number] mass index is 53.42 kg/m.     Last Height:   Ht Readings from Last 1 Encounters:   05/31/16 5\' 5"  (1.651 m)    Physical exam:  General: The patient is awake, alert and appears not in acute distress. The patient is well groomed. Head: Normocephalic, atraumatic. Neck is supple. Mallampati 4 neck circumference:16.5 . Cardiovascular:  Regular rate and rhythm , without  murmurs or carotid bruit, and without distended neck veins. Respiratory: Lungs are clear to auscultation. Skin:  Without evidence of edema, or rash Trunk: BMI is super obese.  Neurologic exam : The patient is awake and alert, oriented to  place and time.   Memory subjective  described as intact.  Attention span & concentration ability appears normal.  Speech is fluent,  without dysarthria, dysphonia or aphasia.  Mood and affect are appropriate.  Cranial nerves: Pupils are equal and briskly reactive to light.  Extraocular movements  in vertical and horizontal planes intact and without nystagmus. Visual fields by finger perimetry are intact. Hearing to finger rub intact. Facial sensation intact to fine touch. Facial motor strength is symmetric and tongue and uvula move midline. Shoulder shrug was symmetrical.   Motor exam:   Normal tone, muscle bulk and symmetric strength in all extremities.  Sensory:  Fine touch, pinprick and vibration /Proprioception tested in the upper extremities was normal.  Coordination: Rapid alternating movements I/Finger-to-nose maneuver  normal without evidence of ataxia, dysmetria or tremor.  Gait and station: Patient walks without assistive device and is able unassisted to climb up to the exam table. Strength within normal limits.  Stance is stable and wide based due to obesity . Turns with  3  Steps. Romberg testing is  negative.  Deep tendon reflexes: in the  upper and lower extremities are symmetric and intact. Babinski maneuver response is  downgoing.  The patient was advised of the nature of the diagnosed sleep disorder , the treatment options and risks for general a  health and wellness arising from not treating the condition.  I spent more than 45 minutes of face to face time with the patient. Greater than 50% of time was spent in counseling and coordination of care. We have discussed the diagnosis and differential and I answered the patient's questions.     Assessment:  After physical and neurologic examination, review of laboratory studies,  Personal review of imaging studies, reports of other /same  Imaging studies ,  Results of polysomnography/ neurophysiology testing and pre-existing records as far as provided in visit., my assessment is   1) I strongly believe that Alicia Bentley still has a shingles related headache, she also has quite significant tension over the paraspinal cervical area. The location of the shingles could make her prone to develop occipital neuralgia which is a posterior peptic and condition. At this time she has gotten some relief with Lyrica her pain has been manageable and that some days she is pain-free. For this reason I do not want her to change medications and I will prescribe Lyrica 75 mg twice a day. I had a coupon card that hopefully will make the medication unaffordable. If the patient's co-pay is still very high I would consider treating her with trigger point injections, and/ or  topirama  Plan:  Treatment plan and additional workup :  Rv prn. Lyrica 75 mg was already initiated by PCP.     Asencion Partridge Magdalene Tardiff MD  05/31/2016   CC: Carol Ada, [place] [place], [place] [id number]

## 2016-05-31 NOTE — Patient Instructions (Signed)
Pregabalin capsules What is this medicine? PREGABALIN (pre GAB a lin) is used to treat nerve pain from diabetes, shingles, spinal cord injury, and fibromyalgia. It is also used to control seizures in epilepsy. This medicine may be used for other purposes; ask your health care provider or pharmacist if you have questions. What should I tell my health care provider before I take this medicine? They need to know if you have any of these conditions: -bleeding problems -heart disease, including heart failure -history of alcohol or drug abuse -kidney disease -suicidal thoughts, plans, or attempt; a previous suicide attempt by you or a family member -an unusual or allergic reaction to pregabalin, gabapentin, other medicines, foods, dyes, or preservatives -pregnant or trying to get pregnant or trying to conceive with your partner -breast-feeding How should I use this medicine? Take this medicine by mouth with a glass of water. Follow the directions on the prescription label. You can take this medicine with or without food. Take your doses at regular intervals. Do not take your medicine more often than directed. Do not stop taking except on your doctor's advice. A special MedGuide will be given to you by the pharmacist with each prescription and refill. Be sure to read this information carefully each time. Talk to your pediatrician regarding the use of this medicine in children. Special care may be needed. Overdosage: If you think you have taken too much of this medicine contact a poison control center or emergency room at once. NOTE: This medicine is only for you. Do not share this medicine with others. What if I miss a dose? If you miss a dose, take it as soon as you can. If it is almost time for your next dose, take only that dose. Do not take double or extra doses. What may interact with this medicine? -alcohol -certain medicines for blood pressure like captopril, enalapril, or  lisinopril -certain medicines for diabetes, like pioglitazone or rosiglitazone -certain medicines for anxiety or sleep -narcotic medicines for pain This list may not describe all possible interactions. Give your health care provider a list of all the medicines, herbs, non-prescription drugs, or dietary supplements you use. Also tell them if you smoke, drink alcohol, or use illegal drugs. Some items may interact with your medicine. What should I watch for while using this medicine? Tell your doctor or healthcare professional if your symptoms do not start to get better or if they get worse. Visit your doctor or health care professional for regular checks on your progress. Do not stop taking except on your doctor's advice. You may develop a severe reaction. Your doctor will tell you how much medicine to take. Wear a medical identification bracelet or chain if you are taking this medicine for seizures, and carry a card that describes your disease and details of your medicine and dosage times. You may get drowsy or dizzy. Do not drive, use machinery, or do anything that needs mental alertness until you know how this medicine affects you. Do not stand or sit up quickly, especially if you are an older patient. This reduces the risk of dizzy or fainting spells. Alcohol may interfere with the effect of this medicine. Avoid alcoholic drinks. If you have a heart condition, like congestive heart failure, and notice that you are retaining water and have swelling in your hands or feet, contact your health care provider immediately. The use of this medicine may increase the chance of suicidal thoughts or actions. Pay special attention to how you are   responding while on this medicine. Any worsening of mood, or thoughts of suicide or dying should be reported to your health care professional right away. This medicine has caused reduced sperm counts in some men. This may interfere with the ability to father a child. You  should talk to your doctor or health care professional if you are concerned about your fertility. Women who become pregnant while using this medicine for seizures may enroll in the [place] American Antiepileptic Drug Pregnancy Registry by calling 1-888-233-2334. This registry collects information about the safety of antiepileptic drug use during pregnancy. What side effects may I notice from receiving this medicine? Side effects that you should report to your doctor or health care professional as soon as possible: -allergic reactions like skin rash, itching or hives, swelling of the face, lips, or tongue -breathing problems -changes in vision -chest pain -confusion -jerking or unusual movements of any part of your body -loss of memory -muscle pain, tenderness, or weakness -suicidal thoughts or other mood changes -swelling of the ankles, feet, hands -unusual bruising or bleeding Side effects that usually do not require medical attention (Report these to your doctor or health care professional if they continue or are bothersome.): -dizziness -drowsiness -dry mouth -headache -nausea -tremors -trouble sleeping -weight gain This list may not describe all possible side effects. Call your doctor for medical advice about side effects. You may report side effects to FDA at 1-800-FDA-1088. Where should I keep my medicine? Keep out of the reach of children. This medicine can be abused. Keep your medicine in a safe place to protect it from theft. Do not share this medicine with anyone. Selling or giving away this medicine is dangerous and against the law. This medicine may cause accidental overdose and death if it taken by other adults, children, or pets. Mix any unused medicine with a substance like cat litter or coffee grounds. Then throw the medicine away in a sealed container like a sealed bag or a coffee can with a lid. Do not use the medicine after the expiration date. Store at room temperature  between 15 and 30 degrees C (59 and 86 degrees F). NOTE: This sheet is a summary. It may not cover all possible information. If you have questions about this medicine, talk to your doctor, pharmacist, or health care provider.    2016, Elsevier/Gold Standard. (2013-11-20 15:38:53)  

## 2016-12-12 ENCOUNTER — Other Ambulatory Visit: Payer: Self-pay | Admitting: Orthopedic Surgery

## 2016-12-12 DIAGNOSIS — M5416 Radiculopathy, lumbar region: Secondary | ICD-10-CM

## 2016-12-28 ENCOUNTER — Ambulatory Visit
Admission: RE | Admit: 2016-12-28 | Discharge: 2016-12-28 | Disposition: A | Discharge status: Home / Self Care | Payer: BC Managed Care – PPO | Source: Ambulatory Visit | Attending: Orthopedic Surgery | Admitting: Orthopedic Surgery

## 2016-12-28 DIAGNOSIS — M5416 Radiculopathy, lumbar region: Secondary | ICD-10-CM

## 2017-01-01 ENCOUNTER — Telehealth: Payer: Self-pay | Admitting: Internal Medicine

## 2017-01-01 NOTE — Telephone Encounter (Signed)
Pt states she has been having pain in her RLQ. Requesting to be seen, has not been seen since Dr. Sharlett Iles. Pt scheduled to see Ellouise Newer PA tomorrow at 2:45pm. Pt aware of appt.

## 2017-01-02 ENCOUNTER — Ambulatory Visit (INDEPENDENT_AMBULATORY_CARE_PROVIDER_SITE_OTHER): Payer: BC Managed Care – PPO | Admitting: Physician Assistant

## 2017-01-02 ENCOUNTER — Encounter: Payer: Self-pay | Admitting: Physician Assistant

## 2017-01-02 VITALS — BP 136/82 | HR 84 | Ht 65.0 in | Wt 319.2 lb

## 2017-01-02 DIAGNOSIS — R1030 Lower abdominal pain, unspecified: Secondary | ICD-10-CM

## 2017-01-02 DIAGNOSIS — R194 Change in bowel habit: Secondary | ICD-10-CM

## 2017-01-02 MED ORDER — METRONIDAZOLE 500 MG PO TABS: 500.0000 mg | ORAL_TABLET | Freq: Three times a day (TID) | ORAL | 0 refills | Status: DC

## 2017-01-02 MED ORDER — HYOSCYAMINE SULFATE 0.125 MG SL SUBL: 0.1250 mg | SUBLINGUAL_TABLET | SUBLINGUAL | 3 refills | Status: DC | PRN

## 2017-01-02 MED ORDER — CIPROFLOXACIN HCL 500 MG PO TABS: 500.0000 mg | ORAL_TABLET | Freq: Two times a day (BID) | ORAL | 0 refills | Status: DC

## 2017-01-02 NOTE — Patient Instructions (Signed)
We have sent the following medications to your pharmacy for you to pick up at your convenience: Flagyl 500 mg three times a day x 7 days  Cipro 500 mg twice a dayx 7 days   Hyoscyamine 0.125 mg every 4-6 hrs as needed for pain/abdominal cramping  We have given you a low fiber, low residue diet

## 2017-01-02 NOTE — Progress Notes (Addendum)
Chief Complaint: Abdominal Pain, Diarrhea  HPI:  Alicia Bentley is a 51 y/o female with a past medical history of tubular adenomas, diverticulosis, irritable bowel syndrome and others listed below,  who presents to clinic today for a complaint of abdominal pain and diarrhea .     Patient previously followed with Dr. Sharlett Iles. Her last colonoscopy was completed on 12/29/12 with a finding of moderate diverticulosis at the splenic flexure and in the descending colon as well as a sessile polyp in the ascending colon. Patient was told to repeat in 5 years. Her care was recently transferred to Dr. Hilarie Fredrickson.   Today, the patient presents to clinic and tells me that on Saturday, 12/29/16, right after eating lunch she developed an excruciating 9-10/10 pain in her right lower quadrant. She tells me that she had eaten a trail mix with a lot of seeds in it and ate a burger with a sesame seeds on the bun and feels like maybe this gave her abdominal pain. She has continued with this pain over the past 2 days which is constant and made worse with a bowel movement or any straining. She also tells me that she has to hold her right side when walking in order to decrease the pain. The pain has decreased some she rates it now is a 6/10. Patient adds that over the past 24 hours it has started to spread over to her left lower quadrant. Patient does describe being slightly more constipated than normal over the past few days. She has tried her hyoscyamine which is just the "normal tablet not the sublingual ones", and did not feel like this helped at all. She also started taking ciprofloxacin 500 mg twice a day for the past 3 days and feels like this has decreased the pain level. Patient tells me that she has had episodes like this in the past but typically only in her left lower quadrant and hyoscyamine helped but "the sublingual kind".   Patient also describes one episode of nausea and vomiting the morning before her symptoms started.  She tells me though that she believes is related to take her medication on an empty stomach. She has had none since.   Patient's social history is positive for leaving for the beach on Monday for a week for her granddaughter's spring break.   Patient denies fever, chills, blood in her stool, melena, change in diet, weight loss, fatigue, anorexia, nausea, heartburn, reflux, dysphagia or symptoms that awaken her at night.    Past Medical History:  Diagnosis Date  . Colon polyps 2010   Tubular Adenoma   . Diverticulosis of colon (without mention of hemorrhage)   . DJD (degenerative joint disease)   . GERD (gastroesophageal reflux disease)   . Headache(784.0)   . Hiatal hernia   . Irritable bowel syndrome   . Miscarriage   . Panic disorder   . Unspecified essential hypertension   . Unspecified urinary incontinence     Past Surgical History:  Procedure Laterality Date  . ABDOMINAL HYSTERECTOMY    . COLONOSCOPY N/A 12/29/2012   Procedure: COLONOSCOPY;  Surgeon: Sable Feil, MD;  Location: [facility];  Service: Endoscopy;  Laterality: N/A;  . deviated septrum    . NASAL SINUS SURGERY    . NISSEN FUNDOPLICATION    . SKIN GRAFT     right ear  . TYMPANOSTOMY TUBE PLACEMENT      Current Outpatient Prescriptions  Medication Sig Dispense Refill  . aspirin 81 MG  tablet Take 81 mg by mouth daily.    . Azelastine-Fluticasone (DYMISTA) 137-50 MCG/ACT SUSP Place into the nose.    . cetirizine (ZYRTEC) 10 MG tablet Take 10 mg by mouth daily.    . citalopram (CELEXA) 20 MG tablet Take 1 tablet by mouth daily.  2  . dexlansoprazole (DEXILANT) 60 MG capsule Take 60 mg by mouth daily.    Marland Kitchen losartan (COZAAR) 50 MG tablet Take 50 mg by mouth daily.    . meloxicam (MOBIC) 15 MG tablet Take 15 mg by mouth daily.    . metaxalone (SKELAXIN) 800 MG tablet Take 800 mg by mouth 3 (three) times daily.    . Omega-3 Fatty Acids (FISH OIL) 1200 MG CAPS Take 2 capsules by mouth daily.    Marland Kitchen  triamterene-hydrochlorothiazide (MAXZIDE) 75-50 MG per tablet Take 1 tablet by mouth daily.     No current facility-administered medications for this visit.     Allergies as of 01/02/2017 - Review Complete 01/02/2017  Allergen Reaction Noted  . Albuterol    . Codeine Itching 02/16/2016  . Tramadol Itching 12/25/2012  . Escitalopram oxalate Itching 11/28/2014    Family History  Problem Relation Age of Onset  . Heart disease Mother   . Diabetes Father   . Heart disease Father   . Dementia Father   . Stroke Father   . Pancreatic cancer Paternal Uncle   . Stomach cancer Paternal Uncle     mets from pancreas  . Colon cancer Neg Hx   . Esophageal cancer Neg Hx     Social History   Social History  . Marital status: Married    Spouse name: N/A  . Number of children: 1  . Years of education: N/A   Occupational History  . housewife    Social History Main Topics  . Smoking status: Never Smoker  . Smokeless tobacco: Never Used  . Alcohol use No  . Drug use: No  . Sexual activity: Not on file   Other Topics Concern  . Not on file   Social History Narrative  . No narrative on file    Review of Systems:    Constitutional: No weight loss, fever or chills Skin: No rash  Cardiovascular: No chest pain Respiratory: No SOB Gastrointestinal: See HPI and otherwise negative Genitourinary: No dysuria Neurological: No headache Musculoskeletal: No new muscle or joint pain Hematologic: No bleeding  Psychiatric: No history of depression or anxiety   Physical Exam:  Vital signs: BP 136/82   Pulse 84   Ht 5\' 5"  (1.651 m)   Wt (!) 319 lb 3.2 oz (144.8 kg)   BMI 53.12 kg/m    Constitutional:   Pleasant obese Caucasian female appears to be in NAD, Well developed, Well nourished, alert and cooperative Head:  Normocephalic and atraumatic. Eyes:   PEERL, EOMI. No icterus. Conjunctiva pink. Ears:  Normal auditory acuity. Neck:  Supple Throat: Oral cavity and pharynx without  inflammation, swelling or lesion.  Respiratory: Respirations even and unlabored. Lungs clear to auscultation bilaterally.   No wheezes, crackles, or rhonchi.  Cardiovascular: Normal S1, S2. No MRG. Regular rate and rhythm. No peripheral edema, cyanosis or pallor.  Gastrointestinal:  Obese,Soft, nondistended, moderate TTP in the right lower quadrant, mild TTP left lower quadrant No rebound or guarding. Normal bowel sounds. No appreciable masses or hepatomegaly (limited by body habitus). Rectal:  Not performed.  Msk:  Symmetrical without gross deformities. Without edema, no deformity or joint abnormality.  Neurologic:  Alert and  oriented x4;  grossly normal neurologically.  Skin:   Dry and intact without significant lesions or rashes. Psychiatric: Demonstrates good judgement and reason without abnormal affect or behaviors.  No recent labs or imaging.  Assessment: 1. Abdominal pain: Right lower quadrant pain which started 4 days ago, initially a 10/10, now a 6/10 with use of ciprofloxacin 500 mg twice a day over the past 3 days, patient does describe a change towards constipation recently, last colonoscopy 2014 with left-sided diverticular disease and a polyp in the right colon; consider colitis versus less likely diverticulitis versus IBS versus other 2. Change in bowel habits: Towards constipation with above  Plan: 1. Initially discussed CT abdomen and pelvis with contrast and labs with the patient but then she told me that she has started some "old antibiotics" ciprofloxacin 500 mg twice a day 3 days ago. Likely this would skew our findings today. 2. Patient pushed me to have antibiotics as "they have already seemed to be working". We did discuss the risks of empiric treatment, but the patient is leaving next week for the beach and would like to have some medicine which will "hopefully make me better" 3. Discussed a low fiber/ low residue diet while she still is experiencing abdominal pain 4.  Prescribe Ciprofloxacin 500 mg twice a day 7 days and Metronidazole 500 mg 3 times a day 7 days 5. Did advise the patient that if she has an increase in abdominal pain, nausea, vomiting, fever, chills, blood in her stool or any exacerbation of her symptoms she should go to the nearest ER. 6. Patient to return to clinic as needed, this would be with Dr. Hilarie Fredrickson or myself-if continues with abdominal pain after abx will need imaging and labs  Ellouise Newer, PA-C [facility] 01/02/2017, 2:55 PM  Cc: Carol Ada, MD;  Patient also requested Hyoscyamine Sulfate 0.125 mg SL tabs to have on hand. Sent prescription.  Addendum: Reviewed and agree with initial management. Jerene Bears, MD

## 2017-01-16 ENCOUNTER — Telehealth: Payer: Self-pay | Admitting: Physician Assistant

## 2017-01-16 DIAGNOSIS — R109 Unspecified abdominal pain: Secondary | ICD-10-CM

## 2017-01-16 DIAGNOSIS — R140 Abdominal distension (gaseous): Secondary | ICD-10-CM

## 2017-01-16 NOTE — Telephone Encounter (Signed)
Per Ellouise Newer PA office note:  "Discussed a low fiber/ low residue diet while she still is experiencing abdominal pain  Prescribe Ciprofloxacin 500 mg twice a day 7 days and Metronidazole 500 mg 3 times a day 7 days  Did advise the patient that if she has an increase in abdominal pain, nausea, vomiting, fever, chills, blood in her stool or any exacerbation of her symptoms she should go to the nearest ER."  The pt states she is following the diet recommended, finished the abx, she continues to have abd pain and change in stools.  Pt is very insistent on having an appt with [facility].  I consulted with Ellouise Newer and she advised that the pt should be scheduled for a CT scan of abd and pelvis.

## 2017-01-17 NOTE — Telephone Encounter (Signed)
Pt has been advised of the appt and she will come in for prep and instructions on Monday.     You have been scheduled for a CT scan of the abdomen and pelvis at [facility] ([address]---this is in the same building as [facility]).   You are scheduled on 01/23/17 at 930 am. You should arrive 15 minutes prior to your appointment time for registration. Please follow the written instructions below on the day of your exam:  WARNING: IF YOU ARE ALLERGIC TO IODINE/X-RAY DYE, PLEASE NOTIFY RADIOLOGY IMMEDIATELY AT [phone]! YOU WILL BE GIVEN A 13 HOUR PREMEDICATION PREP.  1) Do not eat or drink anything after 530 am (4 hours prior to your test) 2) You have been given 2 bottles of oral contrast to drink. The solution may taste better if refrigerated, but do NOT add ice or any other liquid to this solution. Shake well before drinking.   Drink 1 bottle of contrast @ 730 am (2 hours prior to your exam) Drink 1 bottle of contrast @ 830 am (1 hour prior to your exam)  You may take any medications as prescribed with a small amount of water except for the following: Metformin, Glucophage, Glucovance, Avandamet, Riomet, Fortamet, Actoplus Met, Janumet, Glumetza or Metaglip. The above medications must be held the day of the exam AND 48 hours after the exam.  The purpose of you drinking the oral contrast is to aid in the visualization of your intestinal tract. The contrast solution may cause some diarrhea. Before your exam is started, you will be given a small amount of fluid to drink. Depending on your individual set of symptoms, you may also receive an intravenous injection of x-ray contrast/dye. Plan on being at [facility] for 30 minutes or longer, depending on the type of exam you are having performed.  This test typically takes 30-45 minutes to complete.  If you have any questions regarding your exam or if you need to reschedule, you may call the CT department at  [phone] between the hours of 8:00 am and 5:00 pm, Monday-Friday.  _______________________________________________________

## 2017-01-23 ENCOUNTER — Ambulatory Visit (INDEPENDENT_AMBULATORY_CARE_PROVIDER_SITE_OTHER)
Admission: RE | Admit: 2017-01-23 | Discharge: 2017-01-23 | Disposition: A | Discharge status: Home / Self Care | Payer: BC Managed Care – PPO | Source: Ambulatory Visit | Attending: Physician Assistant | Admitting: Physician Assistant

## 2017-01-23 DIAGNOSIS — R109 Unspecified abdominal pain: Secondary | ICD-10-CM | POA: Diagnosis not present

## 2017-01-23 DIAGNOSIS — R140 Abdominal distension (gaseous): Secondary | ICD-10-CM

## 2017-01-23 MED ORDER — IOPAMIDOL (ISOVUE-300) INJECTION 61%
100.0000 mL | Freq: Once | INTRAVENOUS | Status: AC | PRN
  Administered 2017-01-23: 100 mL via INTRAVENOUS

## 2017-01-29 ENCOUNTER — Encounter: Payer: Self-pay | Admitting: Physician Assistant

## 2017-01-29 ENCOUNTER — Other Ambulatory Visit (INDEPENDENT_AMBULATORY_CARE_PROVIDER_SITE_OTHER): Payer: BC Managed Care – PPO

## 2017-01-29 ENCOUNTER — Ambulatory Visit (INDEPENDENT_AMBULATORY_CARE_PROVIDER_SITE_OTHER): Payer: BC Managed Care – PPO | Admitting: Physician Assistant

## 2017-01-29 VITALS — BP 132/82 | HR 77 | Ht 65.0 in | Wt 322.8 lb

## 2017-01-29 DIAGNOSIS — R938 Abnormal findings on diagnostic imaging of other specified body structures: Secondary | ICD-10-CM | POA: Diagnosis not present

## 2017-01-29 DIAGNOSIS — R194 Change in bowel habit: Secondary | ICD-10-CM

## 2017-01-29 DIAGNOSIS — R1032 Left lower quadrant pain: Secondary | ICD-10-CM

## 2017-01-29 DIAGNOSIS — R9389 Abnormal findings on diagnostic imaging of other specified body structures: Secondary | ICD-10-CM

## 2017-01-29 LAB — COMPREHENSIVE METABOLIC PANEL
ALBUMIN: 4.4 g/dL (ref 3.5–5.2)
ALT: 3 U/L (ref 0–35)
AST: 19 U/L (ref 0–37)
Alkaline Phosphatase: 72 U/L (ref 39–117)
BUN: 14 mg/dL (ref 6–23)
CALCIUM: 9.5 mg/dL (ref 8.4–10.5)
CHLORIDE: 102 meq/L (ref 96–112)
CO2: 29 mEq/L (ref 19–32)
Creatinine, Ser: 0.69 mg/dL (ref 0.40–1.20)
GFR: 94.89 mL/min (ref >60.00)
Glucose, Bld: 94 mg/dL (ref 70–99)
Potassium: 4.2 mEq/L (ref 3.5–5.1)
Sodium: 138 mEq/L (ref 135–145)
Total Bilirubin: 0.5 mg/dL (ref 0.2–1.2)
Total Protein: 7.2 g/dL (ref 6.0–8.3)

## 2017-01-29 LAB — CBC WITH DIFFERENTIAL/PLATELET
BASOS PCT: 1.2 % (ref 0.0–3.0)
Basophils Absolute: 0.1 10*3/uL (ref 0.0–0.1)
Eosinophils Absolute: 0.1 10*3/uL (ref 0.0–0.7)
Eosinophils Relative: 1.1 % (ref 0.0–5.0)
HEMATOCRIT: 39.8 % (ref 36.0–46.0)
Hemoglobin: 13.2 g/dL (ref 12.0–15.0)
LYMPHS PCT: 31.5 % (ref 12.0–46.0)
Lymphs Abs: 1.9 10*3/uL (ref 0.7–4.0)
MCHC: 33.2 g/dL (ref 30.0–36.0)
MCV: 84.7 fl (ref 78.0–100.0)
Monocytes Absolute: 0.4 10*3/uL (ref 0.1–1.0)
Monocytes Relative: 6.4 % (ref 3.0–12.0)
NEUTROS ABS: 3.6 10*3/uL (ref 1.4–7.7)
Neutrophils Relative %: 59.8 % (ref 43.0–77.0)
PLATELETS: 293 10*3/uL (ref 150.0–400.0)
RBC: 4.7 Mil/uL (ref 3.87–5.11)
RDW: 14.3 % (ref 11.5–15.5)
WBC: 6 10*3/uL (ref 4.0–10.5)

## 2017-01-29 NOTE — Patient Instructions (Signed)
Your physician has requested that you go to the basement for the following lab work before leaving today: CBC, [place] will be due for a recall colonoscopy in 2019. We will send you a reminder in the mail when it gets closer to that time.  Increase back to high fiber diet slowly.   Can schedule your hyoscyamine three times a day or four times a day as needed.

## 2017-01-29 NOTE — Progress Notes (Signed)
Chief Complaint: Abdominal pain, constipation  HPI:  Alicia Bentley is a 51 year old Caucasian female with a past medical history of tubular adenomas, diverticulosis, irritable bowel syndrome and others listed below, who returns to clinic today for follow-up of her abdominal pain and new complaint of diarrhea.     Please recall patient was last seen in clinic on 01/02/17 and at that time it was noted that her last colonoscopy was completed by Dr. Sharlett Iles 12/29/12 with recommendations for repeat in 5 years due to sessile polyp in the ascending colon. Her care had been transferred to Dr. Hilarie Fredrickson. At that time she described a  9-10/10 pain in her right lower quadrant which then changed to her left lower quadrant over the next few days. She had already started ciprofloxacin 500 mg twice a day for the past 3 days and had felt like this was decreasing her pain. She had  an episode of nausea and vomiting the morning her symptoms started but that was thought to be unrelated. At that time the patient pushed for antibiotics as she was going to the beach for the next week. She was started on ciprofloxacin 500 mg twice a day 7 days and metronidazole 500 mg 3 times a day 7 days. Patient then called our office on 01/16/17 with continued abdominal pain after finishing antibiotics a week before. She had a CT of the abdomen and pelvis ordered.    CT abdomen pelvis with contrast completed 01/23/17 showed a ringlike focal mild inflammation of mesenteric fat in the right anterior pelvis 3.8 x 2.3 cm consistent with focal panniculitis or mesenteric appendage I discussed with no mesenteric abscess or fluid collection. There is no evidence of acute colitis or diverticulitis. Normal appendix. No pericecal inflammation. No small bowel obstruction. No hydronephrosis or hydroureter. Mild fatty infiltration of the liver in patient status post hysterectomy.   Today, the patient tells me that she has continued with a nagging 2/10 the pain  in her left lower quadrant. She describes that after start getting the antibiotic she did start on a low fiber/ low residue diet and became quite constipated, she has had to take multiple laxatives and then magnesium citrate over the weekend as she had not had a bowel movement in 5 days. Patient has been slowly adding back in fiber to her diet which does not seem to bother her left lower quadrant abdominal pain. She has also been occasionally using Hyoscyamine when the pain "gets intense". Overall the patient feels better than before but was concerned regarding her CT findings.   Patient denies fever, chills, blood in her stool, melena, weight loss, nausea, vomiting or symptoms that awaken her at night.  Past Medical History:  Diagnosis Date  . Colon polyps 2010   Tubular Adenoma   . Diverticulosis of colon (without mention of hemorrhage)   . DJD (degenerative joint disease)   . GERD (gastroesophageal reflux disease)   . Headache(784.0)   . Hiatal hernia   . Irritable bowel syndrome   . Miscarriage   . Panic disorder   . Unspecified essential hypertension   . Unspecified urinary incontinence     Past Surgical History:  Procedure Laterality Date  . ABDOMINAL HYSTERECTOMY    . COLONOSCOPY N/A 12/29/2012   Procedure: COLONOSCOPY;  Surgeon: Sable Feil, MD;  Location: [facility];  Service: Endoscopy;  Laterality: N/A;  . deviated septrum    . NASAL SINUS SURGERY    . NISSEN FUNDOPLICATION    . SKIN  GRAFT     right ear  . TYMPANOSTOMY TUBE PLACEMENT      Current Outpatient Prescriptions  Medication Sig Dispense Refill  . aspirin 81 MG tablet Take 81 mg by mouth daily.    . Azelastine-Fluticasone (DYMISTA) 137-50 MCG/ACT SUSP Place into the nose.    . cetirizine (ZYRTEC) 10 MG tablet Take 10 mg by mouth daily.    . citalopram (CELEXA) 20 MG tablet Take 1 tablet by mouth daily.  2  . dexlansoprazole (DEXILANT) 60 MG capsule Take 60 mg by mouth daily.    . hyoscyamine (LEVSIN  SL) 0.125 MG SL tablet Place 1 tablet (0.125 mg total) under the tongue every 4 (four) hours as needed. 30 tablet 3  . losartan (COZAAR) 50 MG tablet Take 50 mg by mouth daily.    . meloxicam (MOBIC) 15 MG tablet Take 15 mg by mouth daily.    . metaxalone (SKELAXIN) 800 MG tablet Take 800 mg by mouth 3 (three) times daily.    . Omega-3 Fatty Acids (FISH OIL) 1200 MG CAPS Take 2 capsules by mouth daily.    Marland Kitchen triamterene-hydrochlorothiazide (MAXZIDE) 75-50 MG per tablet Take 1 tablet by mouth daily.     No current facility-administered medications for this visit.     Allergies as of 01/29/2017 - Review Complete 01/29/2017  Allergen Reaction Noted  . Albuterol    . Codeine Itching 02/16/2016  . Tramadol Itching 12/25/2012  . Escitalopram oxalate Itching 11/28/2014    Family History  Problem Relation Age of Onset  . Heart disease Mother   . Diabetes Father   . Heart disease Father   . Dementia Father   . Stroke Father   . Pancreatic cancer Paternal Uncle   . Stomach cancer Paternal Uncle     mets from pancreas  . Colon cancer Neg Hx   . Esophageal cancer Neg Hx     Social History   Social History  . Marital status: Married    Spouse name: N/A  . Number of children: 1  . Years of education: N/A   Occupational History  . housewife    Social History Main Topics  . Smoking status: Never Smoker  . Smokeless tobacco: Never Used  . Alcohol use No  . Drug use: No  . Sexual activity: Not on file   Other Topics Concern  . Not on file   Social History Narrative  . No narrative on file    Review of Systems:    Constitutional: No weight loss, fever or chills Skin: No rash  Cardiovascular: No chest pain Respiratory: No SOB  Gastrointestinal: See HPI and otherwise negative   Physical Exam:  Vital signs: BP 132/82   Pulse 77   Ht '5\' 5"'$  (1.651 m)   Wt (!) 322 lb 12.8 oz (146.4 kg)   SpO2 98%   BMI 53.72 kg/m   Constitutional:   Pleasant Obese Caucasian female  appears to be in NAD, Well developed, Well nourished, alert and cooperative Respiratory: Respirations even and unlabored. Lungs clear to auscultation bilaterally.   No wheezes, crackles, or rhonchi.  Cardiovascular: Normal S1, S2. No MRG. Regular rate and rhythm. No peripheral edema, cyanosis or pallor.  Gastrointestinal:  Soft, nondistended, mild right lower quadrant abdominal tenderness to palpation. No rebound or guarding. Normal bowel sounds. No appreciable masses or hepatomegaly. Psychiatric:  Demonstrates good judgement and reason without abnormal affect or behaviors.  No recent labs:  Imaging:  CT ABDOMEN AND PELVIS WITH CONTRAST  01/23/17  TECHNIQUE: Multidetector CT imaging of the abdomen and pelvis was performed using the standard protocol following bolus administration of intravenous contrast.  CONTRAST:  ISOVUE-300 IOPAMIDOL (ISOVUE-300) INJECTION 61%  COMPARISON:  CT scan 04/17/2013  FINDINGS: Lower chest: There is mild thickening of distal esophageal wall suspicious for gastroesophageal reflux. Lung bases shows no acute findings. Postsurgical changes post Nissen fundoplication.  Hepatobiliary: Mild fatty infiltration of the liver. No focal hepatic mass. No calcified gallstones are noted within gallbladder.  Pancreas: Unremarkable. No pancreatic ductal dilatation or surrounding inflammatory changes.  Spleen: Normal in size without focal abnormality.  Adrenals/Urinary Tract: No adrenal gland mass. Enhanced kidneys are symmetrical in size. No hydronephrosis or hydroureter. Delayed renal images shows bilateral renal symmetrical excretion. Bilateral visualized proximal ureter is unremarkable.  Stomach/Bowel: Oral contrast material was given to the patient. There is no gastric outlet obstruction. No small bowel obstruction. No thickened or dilated small bowel loops. The terminal ileum is unremarkable. Normal appendix partially visualized in axial  image 57. No pericecal inflammation. Contrast material noted throughout the colon. No evidence of acute colitis or diverticulitis. Mild redundant sigmoid colon. Some colonic stool are noted in distal sigmoid colon without distal colonic obstruction.  Vascular/Lymphatic: No aortic aneurysm. No retroperitoneal or mesenteric adenopathy.  Reproductive: The patient is status post hysterectomy  Other: Axial image 77 there is a ring like focal stranding of mesenteric fat in right anterior pelvis just lateral to sigmoid colon. Measures about 3.8 cm x 2.3 cm. This is confirmed in axial image 43. Findings are consistent with focal panniculitis or mesenteric appendagitis. No mesenteric abscess or fluid collection. No thickening of colonic wall. No pericolonic abscess.  Musculoskeletal: No destructive bony lesions are noted. Sagittal images of the spine shows mild degenerative changes with anterior spurring lower thoracic and upper lumbar spine. There is mild disc space flattening with posterior spurring and minimal spinal canal stenosis at L1-L2 level.  IMPRESSION: 1. There is ring like focal mild inflammation of mesenteric fat in right anterior pelvis axial image 77 measures 3.8 x 2.3 cm. Findings are consistent with focal panniculitis or mesenteric appendagitis. No mesenteric abscess or fluid collection. 2. No evidence of acute colitis or diverticulitis. 3. Normal appendix.  No pericecal inflammation. 4. No small bowel obstruction. 5. No hydronephrosis or hydroureter. 6. Mild fatty infiltration of the liver. 7. Status post hysterectomy.   Electronically Signed   By: Natasha Mead M.D.   On: 01/23/2017 10:51     Assessment: 1. Left lower quadrant abdominal pain: Continues but now only a 2/10 compared to 9-10/10 when this started , consider lingering diverticular disease versus IBS versus other 2. Change in bowel habits: Towards constipation when patient switched to a low  fiber/ low residue diet 3. 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 Discussed results of CT scan with the patient. These had previously been discussed by Dr. Rhea Belton. Explained to the patient that typically panniculitis resolves on its own. The patient is not having pain on this side of her abdomen at all, she continues to complain of a left-sided lower quadrant abdominal pain which is now only 2/10. This is relieved by Hyoscyamine. Likely this is related to IBS and now constipation 2. Recommend the patient gradually increased back to a high-fiber diet as this seems to keep her more regular with her bowel movements. If she notices increased pain while doing that she can back off.  3. Recommend the patient start MiraLAX at least once daily until she obtains regular bowel movements 4. Discussed that the patient can schedule her Hyoscyamine 3 times daily before meals if she would like. Did discuss that this can cause constipation though, so she should keep an eye on this 5. Patient would like a follow-up appointment scheduled with Dr. Hilarie Fredrickson is she has never met him, if she is feeling completely better in 1- 2 months, she can cancel  Ellouise Newer, PA-C [facility] 01/29/2017, 11:57 AM  Cc: Carol Ada, MD

## 2017-02-12 ENCOUNTER — Other Ambulatory Visit: Payer: Self-pay | Admitting: Obstetrics and Gynecology

## 2017-02-12 DIAGNOSIS — N644 Mastodynia: Secondary | ICD-10-CM

## 2017-02-15 ENCOUNTER — Ambulatory Visit
Admission: RE | Admit: 2017-02-15 | Discharge: 2017-02-15 | Disposition: A | Discharge status: Home / Self Care | Payer: BC Managed Care – PPO | Source: Ambulatory Visit | Attending: Obstetrics and Gynecology | Admitting: Obstetrics and Gynecology

## 2017-02-15 DIAGNOSIS — N644 Mastodynia: Secondary | ICD-10-CM

## 2017-03-12 ENCOUNTER — Encounter: Payer: Self-pay | Admitting: *Deleted

## 2017-03-25 ENCOUNTER — Ambulatory Visit: Payer: BC Managed Care – PPO | Admitting: Internal Medicine

## 2017-12-30 ENCOUNTER — Encounter: Payer: Self-pay | Admitting: Internal Medicine

## 2018-11-10 ENCOUNTER — Encounter: Payer: Self-pay | Admitting: *Deleted

## 2018-12-09 ENCOUNTER — Other Ambulatory Visit: Payer: Self-pay | Admitting: Family Medicine

## 2018-12-09 DIAGNOSIS — N63 Unspecified lump in unspecified breast: Secondary | ICD-10-CM

## 2018-12-16 ENCOUNTER — Ambulatory Visit
Admission: RE | Admit: 2018-12-16 | Discharge: 2018-12-16 | Disposition: A | Discharge status: Home / Self Care | Payer: BC Managed Care – PPO | Source: Ambulatory Visit | Attending: Family Medicine | Admitting: Family Medicine

## 2018-12-16 ENCOUNTER — Other Ambulatory Visit: Payer: BC Managed Care – PPO

## 2018-12-16 ENCOUNTER — Ambulatory Visit: Payer: BC Managed Care – PPO

## 2018-12-16 DIAGNOSIS — N63 Unspecified lump in unspecified breast: Secondary | ICD-10-CM

## 2020-03-17 ENCOUNTER — Ambulatory Visit: Payer: 59 | Admitting: Nurse Practitioner

## 2020-03-17 ENCOUNTER — Encounter: Payer: Self-pay | Admitting: Nurse Practitioner

## 2020-03-17 VITALS — BP 124/90 | HR 76 | Ht 64.0 in | Wt 306.5 lb

## 2020-03-17 DIAGNOSIS — R1013 Epigastric pain: Secondary | ICD-10-CM | POA: Diagnosis not present

## 2020-03-17 DIAGNOSIS — K219 Gastro-esophageal reflux disease without esophagitis: Secondary | ICD-10-CM | POA: Diagnosis not present

## 2020-03-17 DIAGNOSIS — Z86010 Personal history of colonic polyps: Secondary | ICD-10-CM

## 2020-03-17 MED ORDER — PANTOPRAZOLE SODIUM 40 MG PO TBEC: 40.0000 mg | DELAYED_RELEASE_TABLET | Freq: Every day | ORAL | 1 refills | Status: DC

## 2020-03-17 MED ORDER — HYOSCYAMINE SULFATE 0.125 MG SL SUBL: 0.1250 mg | SUBLINGUAL_TABLET | Freq: Three times a day (TID) | SUBLINGUAL | 1 refills | Status: DC | PRN

## 2020-03-17 NOTE — Progress Notes (Signed)
03/17/2020 Alicia Bentley [id number] 1966-02-16   CHIEF COMPLAINT: epigastric pain   HISTORY OF PRESENT ILLNESS:  Alicia Bentley is a 54 year old female with a past medical history of arthritis, lower back pain, hypertension, fatty liver, GERD, IBS and tubular adenomatous colon polyps. Past Nissen Fundoplication and hysterectomy. She was last seen in our office by Ellouise Newer PA-C 02/02/2017 due to having LLQ abdominal pain with CTAP showing panniculitis/messenteric appendagitis to the RLQ treated with Hyoscyamine. She presents today for further evaluation for gnawing dull epigastric pain which started the end of April 2021. She reports having a history of stomach ulcers at the age of 37. She denies having any heartburn or dysphagia. No N/V. She is taking ASA 81mg  daily and Meloxicam 15mg  daily. She was taking Dexilant 60mg  daily and her PCP switched her to Pantoprazole 40mg  daily on 02/15/2020. Her epigastric pain has mostly resolved since taking Pantoprazole. Paternal uncle with history of stomach cancer with mets to the pancreas. She is passing a normal brown BM daily. No rectal bleeding or black stool. She has occasional constipation, takes Miralax or Dulcolax as needed. She takes rarely takes Dicyclomine as needed for IBS symptoms, lower abdominal pain. Her most recent colonoscopy was done by Dr. Sharlett Iles 12/29/2012 which identified a 3-62mm sessile polyp removed from the ascending colon and diverticulosis to the splenic flexure and descending colon. A repeat colonoscopy in 5 years was recommended. Colonoscopy 03/04/09 a large 2.5 cm flat tubular adenomatous polyps was removed from the cecum and colon biopsies were negative, no colitis. She has lost 35 lbs since starting a keto diet 11/2018.   CBC Latest Ref Rng & Units 01/29/2017 04/14/2013 12/25/2012  WBC 4.0 - 10.5 K/uL 6.0 8.8 6.6  Hemoglobin 12.0 - 15.0 g/dL 13.2 13.3 13.0  Hematocrit 36 - 46 % 39.8 40.2 38.5  Platelets 150 - 400  K/uL 293.0 331.0 300.0    CMP Latest Ref Rng & Units 01/29/2017 04/14/2013 12/25/2012  Glucose 70 - 99 mg/dL 94 93 95  BUN 6 - 23 mg/dL 14 13 14   Creatinine 0.40 - 1.20 mg/dL 0.69 0.7 0.7  Sodium 135 - 145 mEq/L 138 139 138  Potassium 3.5 - 5.1 mEq/L 4.2 4.6 4.4  Chloride 96 - 112 mEq/L 102 103 103  CO2 19 - 32 mEq/L 29 31 26   Calcium 8.4 - 10.5 mg/dL 9.5 9.2 9.1  Total Protein 6.0 - 8.3 g/dL 7.2 7.7 7.6  Total Bilirubin 0.2 - 1.2 mg/dL 0.5 0.4 0.6  Alkaline Phos 39 - 117 U/L 72 76 89  AST 0 - 37 U/L 19 22 22   ALT 0 - 35 U/L 3 4 5      Past Medical History:  Diagnosis Date  . Diverticulosis of colon (without mention of hemorrhage)   . DJD (degenerative joint disease)   . Fatty liver   . GERD (gastroesophageal reflux disease)   . Headache(784.0)   . Hiatal hernia   . Irritable bowel syndrome   . Miscarriage   . Panic disorder   . Tubular adenoma of colon 2010  . Unspecified essential hypertension   . Unspecified urinary incontinence    Past Surgical History:  Procedure Laterality Date  . ABDOMINAL HYSTERECTOMY    . COLONOSCOPY N/A 12/29/2012   Procedure: COLONOSCOPY;  Surgeon: Sable Feil, MD;  Location: [facility];  Service: Endoscopy;  Laterality: N/A;  . deviated septrum    . NASAL SINUS SURGERY    . NISSEN FUNDOPLICATION    .  SKIN GRAFT     right ear  . TYMPANOSTOMY TUBE PLACEMENT      reports that she has never smoked. She has never used smokeless tobacco. She reports that she does not drink alcohol and does not use drugs. family history includes Dementia in her father; Diabetes in her father; Heart disease in her father and mother; Pancreatic cancer in her paternal uncle; Stomach cancer in her paternal uncle; Stroke in her father.   Allergies  Allergen Reactions  . Albuterol     REACTION: makes heart beat faster  . Codeine Itching  . Tramadol Itching  . Escitalopram Oxalate Itching      Outpatient Encounter Medications as of 03/17/2020  Medication  Sig  . aspirin 81 MG tablet Take 81 mg by mouth daily.  . Azelastine-Fluticasone (DYMISTA) 137-50 MCG/ACT SUSP Place into the nose.  . cetirizine (ZYRTEC) 10 MG tablet Take 10 mg by mouth daily.  . citalopram (CELEXA) 20 MG tablet Take 1 tablet by mouth daily.  Marland Kitchen dexlansoprazole (DEXILANT) 60 MG capsule Take 60 mg by mouth daily.  . hyoscyamine (LEVSIN SL) 0.125 MG SL tablet Place 1 tablet (0.125 mg total) under the tongue every 4 (four) hours as needed.  Marland Kitchen losartan (COZAAR) 50 MG tablet Take 50 mg by mouth daily.  . meloxicam (MOBIC) 15 MG tablet Take 15 mg by mouth daily.  . metaxalone (SKELAXIN) 800 MG tablet Take 800 mg by mouth 3 (three) times daily.  . Omega-3 Fatty Acids (FISH OIL) 1200 MG CAPS Take 2 capsules by mouth daily.  Marland Kitchen triamterene-hydrochlorothiazide (MAXZIDE) 75-50 MG per tablet Take 1 tablet by mouth daily.   No facility-administered encounter medications on file as of 03/17/2020.    REVIEW OF SYSTEMS: All other systems reviewed and negative except where noted in the History of Present Illness.   PHYSICAL EXAM: BP 124/90 (BP Location: Left Arm, Patient Position: Sitting, Cuff Size: Large)   Pulse 76   Ht 5\' 4"  (1.626 m) Comment: height measured without shoes  Wt (!) 306 lb 8 oz (139 kg)   BMI 52.61 kg/m   General: Well developed obese 54 year old female in no acute distress. Head: Normocephalic and atraumatic. Eyes:  Sclerae non-icteric, conjunctive pink. Ears: Normal auditory acuity. Mouth: Dentition intact. No ulcers or lesions.  Neck: Supple, no lymphadenopathy or thyromegaly.  Lungs: Clear bilaterally to auscultation without wheezes, crackles or rhonchi. Heart: Regular rate and rhythm. No murmur, rub or gallop appreciated.  Abdomen: Soft, nontender, non distended. No masses. No hepatosplenomegaly. Normoactive bowel sounds x 4 quadrants.  Rectal: Deferred. Musculoskeletal: Symmetrical with no gross deformities. Skin: Warm and dry. No rash or lesions on  visible extremities. Extremities: No edema. Neurological: Alert oriented x 4, no focal deficits.  Psychological:  Alert and cooperative. Normal mood and affect.  ASSESSMENT AND PLAN:  90. 54 year old female with a past history of "stomach" ulcers in her 20's, GERD, past Nissen fundoplication presents with epigastric pain. Chronic NSAID use.  -Continue Pantoprazole 40mg  once daily -EGD with dr. Hilarie Fredrickson at [facility]. BM > 50. EGD benefits and risks discussed including risk with sedation, risk of bleeding, perforation and infection  -GERD diet reviewed  -Patient to call our office if her symptoms worsen -Follow up in office in 2 months   2. History of tubular adenomatous polyps. -Colonoscopy benefits and risks discussed including risk with sedation, risk of bleeding, perforation and infection   3. Hypertension. BP elevated 124/90 today, patient stated she did not take  her BP med this am  -Continue follow up with PCP  4. Fatty liver per CTAP 01/23/2017. She has lost 35lbs over the past 1 1/2 years. LFTs normal.  -Check LFTs annually  The patient will call our office when she is ready to schedule an EGD and colonoscopy. She wishes to lose more weight to reduce BMI below 50 which would then allow her to have her procedures at [facility].              CC:  Carol Ada, MD

## 2020-03-17 NOTE — Patient Instructions (Signed)
If you are age 55 or older, your body mass index should be between 23-30. Your Body mass index is 52.61 kg/m. If this is out of the aforementioned range listed, please consider follow up with your Primary Care Provider.  If you are age 52 or younger, your body mass index should be between 19-25. Your Body mass index is 52.61 kg/m. If this is out of the aformentioned range listed, please consider follow up with your Primary Care Provider.   We have sent the following medications to your pharmacy for you to pick up at your convenience:  Pantoprazole 40mg   Hyoscyamine 0.125mg   It has been recommended to you by your physician that you have a(n) Endoscopy/Colonoscopy completed. Per your request, we did not schedule the procedure(s) today. Please contact our office at [phone] should you decide to have the procedure completed.  Due to recent changes in healthcare laws, you may see the results of your imaging and laboratory studies on MyChart before your provider has had a chance to review them.  We understand that in some cases there may be results that are confusing or concerning to you. Not all laboratory results come back in the same time frame and the provider may be waiting for multiple results in order to interpret others.  Please give [place] 48 hours in order for your provider to thoroughly review all the results before contacting the office for clarification of your results.   Thank you for choosing [facility] Noralyn Pick, CRNP

## 2020-03-18 DIAGNOSIS — R1013 Epigastric pain: Secondary | ICD-10-CM | POA: Insufficient documentation

## 2020-03-21 NOTE — Progress Notes (Signed)
Addendum: Reviewed and agree with assessment and management plan. Agree with recommendation for upper endoscopy and colonoscopy Kleber Crean, Lajuan Lines, MD

## 2020-07-05 ENCOUNTER — Other Ambulatory Visit: Payer: Self-pay | Admitting: Obstetrics and Gynecology

## 2020-07-05 DIAGNOSIS — Z1231 Encounter for screening mammogram for malignant neoplasm of breast: Secondary | ICD-10-CM

## 2020-07-15 ENCOUNTER — Other Ambulatory Visit: Payer: Self-pay | Admitting: Nurse Practitioner

## 2020-07-15 MED ORDER — PANTOPRAZOLE SODIUM 40 MG PO TBEC: 40.0000 mg | DELAYED_RELEASE_TABLET | Freq: Every day | ORAL | 1 refills | Status: AC

## 2020-07-15 NOTE — Telephone Encounter (Signed)
Refill has been sent.  °

## 2020-08-05 ENCOUNTER — Other Ambulatory Visit: Payer: Self-pay

## 2020-08-05 ENCOUNTER — Ambulatory Visit
Admission: RE | Admit: 2020-08-05 | Discharge: 2020-08-05 | Disposition: A | Discharge status: Home / Self Care | Payer: 59 | Source: Ambulatory Visit | Attending: Obstetrics and Gynecology | Admitting: Obstetrics and Gynecology

## 2020-08-05 DIAGNOSIS — Z1231 Encounter for screening mammogram for malignant neoplasm of breast: Secondary | ICD-10-CM

## 2020-09-05 ENCOUNTER — Ambulatory Visit: Payer: 59 | Admitting: Internal Medicine

## 2020-09-05 ENCOUNTER — Encounter: Payer: Self-pay | Admitting: Internal Medicine

## 2020-09-05 VITALS — BP 126/88 | HR 86 | Ht 65.0 in | Wt 289.0 lb

## 2020-09-05 DIAGNOSIS — K219 Gastro-esophageal reflux disease without esophagitis: Secondary | ICD-10-CM

## 2020-09-05 DIAGNOSIS — K5900 Constipation, unspecified: Secondary | ICD-10-CM

## 2020-09-05 DIAGNOSIS — Z86010 Personal history of colonic polyps: Secondary | ICD-10-CM

## 2020-09-05 MED ORDER — SUTAB 1479-225-188 MG PO TABS: 1.0000 | ORAL_TABLET | ORAL | 0 refills | Status: DC

## 2020-09-05 NOTE — Patient Instructions (Signed)
You have been scheduled for an endoscopy and colonoscopy. Please follow the written instructions given to you at your visit today. Please pick up your prep supplies at the pharmacy within the next 1-3 days. If you use inhalers (even only as needed), please bring them with you on the day of your procedure.  

## 2020-09-05 NOTE — Progress Notes (Signed)
Subjective:    Patient ID: Alicia Bentley, female    DOB: October 19, 1965, 54 y.o.   MRN: [id number]  HPI Alicia Bentley is a 54 year old female with a past medical history of adenomatous colon polyps (including 2.5 cm cecal adenoma in 2010), colonic diverticulosis, GERD with prior Nissen fundoplication 8295, remote gastric ulcers, obesity, who is here for follow-up.  She is here alone today and was last seen in June 2021 by Carl Best, NP.  At the time of her last visit she was having gnawing epigastric abdominal pain which reminded her of her prior ulcer disease.  EGD was considered and recommended though eventually delayed.  She had been taking Dexilant for some time and this was switched to pantoprazole 40 mg daily.  She continues this therapy and her epigastric pain has resolved.  She is not having issues with heartburn, dysphagia or odynophagia.  No nausea or vomiting.  Of note she has dramatically changed her diet and is now eating a keto diet and has lost 60 pounds since February of this year.  Her goal is to lose 100 additional pounds slowly through diet.  With the diet changes above she has noticed mild constipation and less frequent stooling.  She is using Metamucil and occasionally MiraLAX and Dulcolax.  Stools have been slightly less frequent than once per day and occasionally requires straining.  She has seen occasional red blood on the tissue with wiping only but not on the stool or in the toilet water.  No first-degree family relatives with colon cancer  She is married she has a 71-1/2-year-old daughter who is in eighth grade. No tobacco use. She lives in [place] colonoscopy with Dr. Sharlett Iles March 2014; 5 mm adenoma removed from the ascending colon.  Review of Systems As per HPI, otherwise negative  Current Medications, Allergies, Past Medical History, Past Surgical History, Family History and Social History were reviewed in [employer] record.     Objective:   Physical Exam BP 126/88   Pulse 86   Ht 5\' 5"  (1.651 m)   Wt 289 lb (131.1 kg)   SpO2 97%   BMI 48.09 kg/m  Gen: awake, alert, NAD HEENT: anicteric CV: RRR, no mrg Pulm: CTA b/l Abd: soft, obese, NT/ND, +BS throughout Ext: no c/c/e Neuro: nonfocal      Assessment & Plan:  54 year old female with a past medical history of adenomatous colon polyps (including 2.5 cm cecal adenoma in 2010), colonic diverticulosis, GERD with prior Nissen fundoplication 6213, remote gastric ulcers, obesity, who is here for follow-up.  1.  History of adenomatous colon polyps --she had a 2.5 cm adenoma removed from the cecum in 2010 and subsequent surveillance colonoscopy with 1 subcentimeter adenoma removed in 2014.  She is due for surveillance colonoscopy at this time.  We discussed the risk, benefits and alternatives and she is agreeable and wishes to proceed.  Her BMI is now below 50 with weight loss and so she meets criteria for procedures in the [place] --Colonoscopy  2.  GERD/history of peptic ulcer disease --she was having gnawing epigastric pain earlier this year but this improved when changing Dexilant to pantoprazole.  Given her history I recommend upper endoscopy.  Can be performed on the same day as colonoscopy --Upper endoscopy --Continue pantoprazole 40 mg daily  3.  Mild constipation --likely secondary to dietary change though diet is certainly now more healthy and she has had significant weight loss which is ongoing.  She can use Metamucil safely on a regular basis and as needed MiraLAX  30 minutes total spent today including patient facing time, coordination of care, reviewing medical history/procedures/pertinent radiology studies, and documentation of the encounter.

## 2020-11-07 ENCOUNTER — Other Ambulatory Visit: Payer: Self-pay

## 2020-11-07 ENCOUNTER — Ambulatory Visit (AMBULATORY_SURGERY_CENTER): Payer: 59 | Admitting: Internal Medicine

## 2020-11-07 ENCOUNTER — Encounter: Payer: Self-pay | Admitting: Internal Medicine

## 2020-11-07 VITALS — BP 113/74 | HR 60 | Temp 98.0°F | Resp 13 | Ht 65.0 in | Wt 289.0 lb

## 2020-11-07 DIAGNOSIS — K2950 Unspecified chronic gastritis without bleeding: Secondary | ICD-10-CM | POA: Diagnosis not present

## 2020-11-07 DIAGNOSIS — D123 Benign neoplasm of transverse colon: Secondary | ICD-10-CM

## 2020-11-07 DIAGNOSIS — Z86010 Personal history of colonic polyps: Secondary | ICD-10-CM | POA: Diagnosis not present

## 2020-11-07 DIAGNOSIS — K319 Disease of stomach and duodenum, unspecified: Secondary | ICD-10-CM

## 2020-11-07 DIAGNOSIS — K2970 Gastritis, unspecified, without bleeding: Secondary | ICD-10-CM

## 2020-11-07 DIAGNOSIS — K219 Gastro-esophageal reflux disease without esophagitis: Secondary | ICD-10-CM

## 2020-11-07 DIAGNOSIS — D122 Benign neoplasm of ascending colon: Secondary | ICD-10-CM

## 2020-11-07 DIAGNOSIS — R1013 Epigastric pain: Secondary | ICD-10-CM

## 2020-11-07 MED ORDER — SODIUM CHLORIDE 0.9 % IV SOLN: 500.0000 mL | Freq: Once | INTRAVENOUS | Status: DC

## 2020-11-07 NOTE — Progress Notes (Signed)
Called to room to assist during endoscopic procedure.  Patient ID and intended procedure confirmed with present staff. Received instructions for my participation in the procedure from the performing physician.  

## 2020-11-07 NOTE — Progress Notes (Signed)
Report given to PACU, vss 

## 2020-11-07 NOTE — Progress Notes (Signed)
905 Robinul 0.1 mg IV given due large amount of secretions upon assessment.  MD made aware, vss

## 2020-11-07 NOTE — Patient Instructions (Signed)
Handouts provided:  Gastritis, Polyps and diverticulosis  YOU HAD AN ENDOSCOPIC PROCEDURE TODAY AT [facility]:   Refer to the procedure report that was given to you for any specific questions about what was found during the examination.  If the procedure report does not answer your questions, please call your gastroenterologist to clarify.  If you requested that your care partner not be given the details of your procedure findings, then the procedure report has been included in a sealed envelope for you to review at your convenience later.  YOU SHOULD EXPECT: Some feelings of bloating in the abdomen. Passage of more gas than usual.  Walking can help get rid of the air that was put into your GI tract during the procedure and reduce the bloating. If you had a lower endoscopy (such as a colonoscopy or flexible sigmoidoscopy) you may notice spotting of blood in your stool or on the toilet paper. If you underwent a bowel prep for your procedure, you may not have a normal bowel movement for a few days.  Please Note:  You might notice some irritation and congestion in your nose or some drainage.  This is from the oxygen used during your procedure.  There is no need for concern and it should clear up in a day or so.  SYMPTOMS TO REPORT IMMEDIATELY:   Following lower endoscopy (colonoscopy or flexible sigmoidoscopy):  Excessive amounts of blood in the stool  Significant tenderness or worsening of abdominal pains  Swelling of the abdomen that is new, acute  Fever of 100F or higher   Following upper endoscopy (EGD)  Vomiting of blood or coffee ground material  New chest pain or pain under the shoulder blades  Painful or persistently difficult swallowing  New shortness of breath  Fever of 100F or higher  Black, tarry-looking stools  For urgent or emergent issues, a gastroenterologist can be reached at any hour by calling [phone]. Do not use MyChart messaging for urgent  concerns.    DIET:  We do recommend a small meal at first, but then you may proceed to your regular diet.  Drink plenty of fluids but you should avoid alcoholic beverages for 24 hours.  ACTIVITY:  You should plan to take it easy for the rest of today and you should NOT DRIVE or use heavy machinery until tomorrow (because of the sedation medicines used during the test).    FOLLOW UP: Our staff will call the number listed on your records 48-72 hours following your procedure to check on you and address any questions or concerns that you may have regarding the information given to you following your procedure. If we do not reach you, we will leave a message.  We will attempt to reach you two times.  During this call, we will ask if you have developed any symptoms of COVID 19. If you develop any symptoms (ie: fever, flu-like symptoms, shortness of breath, cough etc.) before then, please call [phone].  If you test positive for Covid 19 in the 2 weeks post procedure, please call and report this information to [place].    If any biopsies were taken you will be contacted by phone or by letter within the next 1-3 weeks.  Please call us at [phone] if you have not heard about the biopsies in 3 weeks.    SIGNATURES/CONFIDENTIALITY: You and/or your care partner have signed paperwork which will be entered into your electronic medical record.  These signatures attest to  the fact that that the information above on your After Visit Summary has been reviewed and is understood.  Full responsibility of the confidentiality of this discharge information lies with you and/or your care-partner.

## 2020-11-07 NOTE — Op Note (Signed)
[facility] Patient Name: Alicia Bentley Procedure Date: 11/07/2020 8:56 AM MRN: [id number] Endoscopist: Jerene Bears , MD Age: 55 Referring MD:  Date of Birth: May 17, 1966 Gender: Female Account #: [id number] Procedure:                Colonoscopy Indications:              High risk colon cancer surveillance: Personal                            history of adenoma (10 mm or greater in size), Last                            colonoscopy: March 2014 Medicines:                Monitored Anesthesia Care Procedure:                Pre-Anesthesia Assessment:                           - Prior to the procedure, a History and Physical                            was performed, and patient medications and                            allergies were reviewed. The patient's tolerance of                            previous anesthesia was also reviewed. The risks                            and benefits of the procedure and the sedation                            options and risks were discussed with the patient.                            All questions were answered, and informed consent                            was obtained. Prior Anticoagulants: The patient has                            taken no previous anticoagulant or antiplatelet                            agents. ASA Grade Assessment: III - A patient with                            severe systemic disease. After reviewing the risks                            and benefits, the patient was deemed in  satisfactory condition to undergo the procedure.                           After obtaining informed consent, the colonoscope                            was passed under direct vision. Throughout the                            procedure, the patient's blood pressure, pulse, and                            oxygen saturations were monitored continuously. The                            Olympus CF-HQ190L [id number])  Colonoscope was                            introduced through the anus and advanced to the                            cecum, identified by appendiceal orifice and                            ileocecal valve. The colonoscopy was performed                            without difficulty. The patient tolerated the                            procedure well. The quality of the bowel                            preparation was good. The ileocecal valve,                            appendiceal orifice, and rectum were photographed. Scope In: 9:16:34 AM Scope Out: 9:34:48 AM Scope Withdrawal Time: 0 hours 14 minutes 56 seconds  Total Procedure Duration: 0 hours 18 minutes 14 seconds  Findings:                 The digital rectal exam was normal.                           A 9 mm polyp was found in the ascending colon. The                            polyp was sessile. The polyp was removed with a                            cold snare. Resection and retrieval were complete.                           A 8 mm polyp was found in the proximal transverse  colon. The polyp was sessile. The polyp was removed                            with a cold snare. Resection and retrieval were                            complete.                           A 4 mm polyp was found in the mid transverse colon.                            The polyp was sessile. The polyp was removed with a                            cold snare. Resection and retrieval were complete.                           A few small-mouthed diverticula were found in the                            sigmoid colon and descending colon.                           Internal hemorrhoids were found during                            retroflexion. The hemorrhoids were small. Complications:            No immediate complications. Estimated Blood Loss:     Estimated blood loss was minimal. Impression:               - One 9 mm polyp in the ascending  colon, removed                            with a cold snare. Resected and retrieved.                           - One 8 mm polyp in the proximal transverse colon,                            removed with a cold snare. Resected and retrieved.                           - One 4 mm polyp in the mid transverse colon,                            removed with a cold snare. Resected and retrieved.                           - Diverticulosis in the sigmoid colon and in the  descending colon.                           - Small internal hemorrhoids. Recommendation:           - Patient has a contact number available for                            emergencies. The signs and symptoms of potential                            delayed complications were discussed with the                            patient. Return to normal activities tomorrow.                            Written discharge instructions were provided to the                            patient.                           - Resume previous diet.                           - Continue present medications.                           - Await pathology results.                           - Repeat colonoscopy is recommended for                            surveillance. The colonoscopy date will be                            determined after pathology results from today's                            exam become available for review. Jerene Bears, MD 11/07/2020 9:44:45 AM This report has been signed electronically.

## 2020-11-07 NOTE — Progress Notes (Signed)
VS by CW  I have reviewed the patient's medical history in detail and updated the computerized patient record.  

## 2020-11-07 NOTE — Op Note (Signed)
[place] Patient Name: Alicia Bentley Procedure Date: 11/07/2020 9:03 AM MRN: [id number] Endoscopist: Jerene Bears , MD Age: 55 Referring MD:  Date of Birth: 12/08/1965 Gender: Female Account #: [id number] Procedure:                Upper GI endoscopy Indications:              Epigastric abdominal pain Medicines:                Monitored Anesthesia Care Procedure:                Pre-Anesthesia Assessment:                           - Prior to the procedure, a History and Physical                            was performed, and patient medications and                            allergies were reviewed. The patient's tolerance of                            previous anesthesia was also reviewed. The risks                            and benefits of the procedure and the sedation                            options and risks were discussed with the patient.                            All questions were answered, and informed consent                            was obtained. Prior Anticoagulants: The patient has                            taken no previous anticoagulant or antiplatelet                            agents. ASA Grade Assessment: III - A patient with                            severe systemic disease. After reviewing the risks                            and benefits, the patient was deemed in                            satisfactory condition to undergo the procedure.                           After obtaining informed consent, the endoscope was  passed under direct vision. Throughout the                            procedure, the patient's blood pressure, pulse, and                            oxygen saturations were monitored continuously. The                            Endoscope was introduced through the mouth, and                            advanced to the second part of duodenum. The upper                            GI endoscopy was accomplished  without difficulty.                            The patient tolerated the procedure well. Scope In: Scope Out: Findings:                 The examined esophagus was normal.                           Evidence of a Nissen fundoplication was found in                            the cardia and in the gastric fundus. The wrap                            appeared intact.                           Mild inflammation characterized by erosions and                            erythema was found in the gastric antrum. Biopsies                            were taken with a cold forceps for histology and                            Helicobacter pylori testing.                           The examined duodenum was normal. Complications:            No immediate complications. Estimated Blood Loss:     Estimated blood loss was minimal. Impression:               - Normal esophagus.                           - A Nissen fundoplication was found. The wrap  appears intact.                           - Gastritis. Biopsied.                           - Normal examined duodenum. Recommendation:           - Patient has a contact number available for                            emergencies. The signs and symptoms of potential                            delayed complications were discussed with the                            patient. Return to normal activities tomorrow.                            Written discharge instructions were provided to the                            patient.                           - Resume previous diet.                           - Continue present medications.                           - Await pathology results. Jerene Bears, MD 11/07/2020 9:41:16 AM This report has been signed electronically.

## 2020-11-09 ENCOUNTER — Telehealth: Payer: Self-pay | Admitting: *Deleted

## 2020-11-09 NOTE — Telephone Encounter (Signed)
  Follow up Call-  Call back number 11/07/2020  Post procedure Call Back phone  # [phone]  Permission to leave phone message Yes  Some recent data might be hidden     1st attempt unsuccessful. VM left.

## 2020-11-09 NOTE — Telephone Encounter (Signed)
  Follow up Call-  Call back number 11/07/2020  Post procedure Call Back phone  # [phone]  Permission to leave phone message Yes  Some recent data might be hidden    LMOM to call back with any questions or concerns.  Also, call back if patient has developed fever, respiratory issues or been dx with COVID or had any family members or close contacts diagnosed since her procedure.

## 2020-11-14 ENCOUNTER — Encounter: Payer: Self-pay | Admitting: Internal Medicine

## 2021-08-03 ENCOUNTER — Other Ambulatory Visit: Payer: Self-pay | Admitting: Obstetrics and Gynecology

## 2021-08-03 DIAGNOSIS — Z1231 Encounter for screening mammogram for malignant neoplasm of breast: Secondary | ICD-10-CM

## 2021-08-11 ENCOUNTER — Other Ambulatory Visit: Payer: Self-pay | Admitting: Family Medicine

## 2021-08-11 DIAGNOSIS — E2839 Other primary ovarian failure: Secondary | ICD-10-CM

## 2021-09-13 ENCOUNTER — Ambulatory Visit
Admission: RE | Admit: 2021-09-13 | Discharge: 2021-09-13 | Disposition: A | Discharge status: Home / Self Care | Payer: 59 | Source: Ambulatory Visit | Attending: Obstetrics and Gynecology | Admitting: Obstetrics and Gynecology

## 2021-09-13 DIAGNOSIS — Z1231 Encounter for screening mammogram for malignant neoplasm of breast: Secondary | ICD-10-CM

## 2021-10-03 ENCOUNTER — Ambulatory Visit
Admission: RE | Admit: 2021-10-03 | Discharge: 2021-10-03 | Disposition: A | Discharge status: Home / Self Care | Payer: 59 | Source: Ambulatory Visit | Attending: Family Medicine | Admitting: Family Medicine

## 2021-10-03 DIAGNOSIS — E2839 Other primary ovarian failure: Secondary | ICD-10-CM

## 2021-10-06 ENCOUNTER — Other Ambulatory Visit: Payer: Self-pay | Admitting: Orthopedic Surgery

## 2021-10-06 DIAGNOSIS — M545 Low back pain, unspecified: Secondary | ICD-10-CM

## 2021-10-28 ENCOUNTER — Other Ambulatory Visit: Payer: Self-pay

## 2021-10-28 ENCOUNTER — Ambulatory Visit
Admission: RE | Admit: 2021-10-28 | Discharge: 2021-10-28 | Disposition: A | Discharge status: Home / Self Care | Payer: 59 | Source: Ambulatory Visit | Attending: Orthopedic Surgery | Admitting: Orthopedic Surgery

## 2021-10-28 DIAGNOSIS — M545 Low back pain, unspecified: Secondary | ICD-10-CM

## 2022-08-06 ENCOUNTER — Other Ambulatory Visit: Payer: Self-pay | Admitting: Obstetrics and Gynecology

## 2022-08-06 DIAGNOSIS — Z1231 Encounter for screening mammogram for malignant neoplasm of breast: Secondary | ICD-10-CM

## 2022-10-04 ENCOUNTER — Ambulatory Visit: Payer: 59

## 2022-12-13 ENCOUNTER — Encounter: Payer: Self-pay | Admitting: *Deleted

## 2022-12-26 ENCOUNTER — Ambulatory Visit: Payer: 59

## 2023-01-15 ENCOUNTER — Ambulatory Visit: Payer: 59 | Admitting: Internal Medicine

## 2023-01-21 ENCOUNTER — Inpatient Hospital Stay: Admission: RE | Admit: 2023-01-21 | Payer: 59 | Source: Ambulatory Visit

## 2023-02-27 ENCOUNTER — Ambulatory Visit
Admission: RE | Admit: 2023-02-27 | Discharge: 2023-02-27 | Disposition: A | Discharge status: Home / Self Care | Payer: 59 | Source: Ambulatory Visit | Attending: Obstetrics and Gynecology | Admitting: Obstetrics and Gynecology

## 2023-02-27 DIAGNOSIS — Z1231 Encounter for screening mammogram for malignant neoplasm of breast: Secondary | ICD-10-CM

## 2023-05-02 ENCOUNTER — Ambulatory Visit: Payer: 59 | Admitting: Internal Medicine

## 2023-05-02 ENCOUNTER — Encounter: Payer: Self-pay | Admitting: Internal Medicine

## 2023-05-02 VITALS — HR 82 | Ht 64.0 in | Wt 337.0 lb

## 2023-05-02 DIAGNOSIS — K219 Gastro-esophageal reflux disease without esophagitis: Secondary | ICD-10-CM | POA: Diagnosis not present

## 2023-05-02 DIAGNOSIS — K5903 Drug induced constipation: Secondary | ICD-10-CM

## 2023-05-02 DIAGNOSIS — Z6841 Body Mass Index (BMI) 40.0 and over, adult: Secondary | ICD-10-CM

## 2023-05-02 MED ORDER — LINACLOTIDE 145 MCG PO CAPS
145.0000 ug | ORAL_CAPSULE | Freq: Every day | ORAL | 11 refills | Status: DC
Start: 2023-05-02 — End: 2024-05-18

## 2023-05-02 NOTE — Progress Notes (Signed)
Subjective:    Patient ID: Alicia Bentley, female    DOB: 1966/06/15, 57 y.o.   MRN: [id number]  HPI Alicia Bentley is a 57 year old female with a past medical history of adenomatous colon polyps, GERD, fatty liver, IBS with diarrhea and constipation, obesity who is seen for follow-up.  She is here alone today.  She was seen last year in January 2022 for upper and lower endoscopy.  EGD performed on 11/07/2020 revealed a normal esophagus.  Intact Nissen wrap.  Mild gastric inflammation biopsies negative for H. pylori Colonoscopy on the same day revealed 3 polyps the largest being 9 mm and found to be adenomatous.  There was diverticulosis in the left colon and small internal hemorrhoids.  She reports that she is here predominantly to talk about constipation.  She was previously taking a daily GLP-1 medication and was successful at losing nearly 50 pounds however she had severe constipation that led her to stop taking this medication.  Even with the medication she was trying Dulcolax, stool softener and another over-the-counter laxative without much success.  Since stopping her GLP medication she has regained weight and is back to 337 here today.  Without GLP medication her bowel movements are regular.  She is not having frequent loose stools.  Her heartburn has been very well-controlled with pantoprazole 40 mg twice daily.  At once a day she has breakthrough heartburn.  No recent dysphagia or odynophagia.  She did have COVID-19 on 2 separate occasions including some long COVID type symptoms.  She is scared about bariatric surgery.   Review of Systems As per HPI, otherwise negative  Current Medications, Allergies, Past Medical History, Past Surgical History, Family History and Social History were reviewed in [employer] record.    Objective:   Physical Exam Pulse 82   Ht 5\' 4"  (1.626 m)   Wt (!) 337 lb (152.9 kg)   SpO2 98%   BMI 57.85 kg/m  Gen: awake,  alert, NAD HEENT: anicteric  Neuro: nonfocal      Assessment & Plan:  57 year old female with a past medical history of adenomatous colon polyps, GERD, fatty liver, IBS with diarrhea and constipation, obesity who is seen for follow-up.    Obesity --her BMI today is 57.9 and we discussed the importance for her general wellbeing, cardiovascular fitness, pulmonary fitness, liver health and joint health that it would be best for her to lose a significant amount of weight.  She was having success on GLP-1 medication and perhaps this is the best option for her.  If she restarts this we can certainly work with constipation. -- Patient [facility]; seems like GLP may be the best option for her but would defer to primary care -- See #2  2.  Medication induced constipation --the benefits of weight loss through GLP medication likely outweigh the risk of constipation and of adding a second drug to combat constipation.  We discussed this at length today -- Samples provided of Linzess 145 mcg daily; she is instructed to take this once a day if GLP-1 medication is restarted and constipation becomes a problem; we that Linzess may need to be dose titrated and in the end may not be the best medication for her but we certainly have other options including Amitiza, Trulance, Motegrity and Ibsrella  3.  Personal history of multiple adenomatous colon polyps --surveillance colonoscopy would be indicated in January 2025; she is aware of this recommendation and understands  that if her BMI remains above 50 we would need to perform this in the outpatient hospital setting  4.  GERD --without Barrett's esophagus.  Continue pantoprazole 40 mg twice daily AC.  If she is successful at significant weight reduction we may build to reduce to once daily PPI  30 minutes total spent today including patient facing time, coordination of care, reviewing medical history/procedures/pertinent radiology  studies, and documentation of the encounter.

## 2023-05-02 NOTE — Patient Instructions (Signed)
When you restart your GLP1 (weight loss medication) please start samples of Linzess taking one capsule by mouth daily.  We have sent the following medications to your pharmacy for you to pick up at your convenience: Linzess 145 mcg daily.   Linzess works best when taken once a day every day, on an empty stomach, at least 30 minutes before your first meal of the day.  When Linzess is taken daily as directed:  *Constipation relief is typically felt in about a week *IBS-C patients may begin to experience relief from belly pain and overall abdominal symptoms (pain, discomfort, and bloating) in about 1 week,   with symptoms typically improving over 12 weeks.  Diarrhea may occur in the first 2 weeks -keep taking it.  The diarrhea should go away and you should start having normal, complete, full bowel movements. It may be helpful to start treatment when you can be near the comfort of your own bathroom, such as a weekend.    We have referred you to weight loss management clinic (Dr. Dalbert Garnet). They will contact you with an appt date and time.   _______________________________________________________  If your blood pressure at your visit was 140/90 or greater, please contact your primary care physician to follow up on this.  _______________________________________________________  If you are age 59 or older, your body mass index should be between 23-30. Your Body mass index is 57.85 kg/m. If this is out of the aforementioned range listed, please consider follow up with your Primary Care Provider.  If you are age 39 or younger, your body mass index should be between 19-25. Your Body mass index is 57.85 kg/m. If this is out of the aformentioned range listed, please consider follow up with your Primary Care Provider.   ________________________________________________________  The [place] [facility] providers would like to encourage you to use [facility] to communicate with providers for non-urgent requests  or questions.  Due to long hold times on the telephone, sending your provider a message by [facility] may be a faster and more efficient way to get a response.  Please allow 48 business hours for a response.  Please remember that this is for non-urgent requests.  _______________________________________________________

## 2023-05-07 ENCOUNTER — Telehealth: Payer: Self-pay | Admitting: Internal Medicine

## 2023-05-07 MED ORDER — HYOSCYAMINE SULFATE 0.125 MG SL SUBL: 0.1250 mg | SUBLINGUAL_TABLET | Freq: Three times a day (TID) | SUBLINGUAL | 1 refills | Status: AC | PRN

## 2023-05-07 NOTE — Telephone Encounter (Signed)
Prescription refill sent to patient's pharmacy

## 2023-05-07 NOTE — Telephone Encounter (Deleted)
hyoscyamine

## 2023-05-07 NOTE — Telephone Encounter (Signed)
Inbound call from patient stating that she needs a refill for Hyoscyamine. Please advise.

## 2023-05-29 ENCOUNTER — Telehealth: Payer: Self-pay | Admitting: Internal Medicine

## 2023-05-29 NOTE — Telephone Encounter (Signed)
Pt states [facility] is not taking new patients as they are so backed up on referrals. Pt wants to know where else she can be referred. Please advise.

## 2023-05-29 NOTE — Telephone Encounter (Signed)
Patent called to follow up on a referral to a weight loss center because she spoke with [facility] center stated they are really booked out. She would like to be sent elsewhere. Asked for call back before 5pm today

## 2023-05-30 NOTE — Telephone Encounter (Signed)
[facility] [address] 6[address], [place] [address]    Appointments [phone]

## 2023-05-30 NOTE — Telephone Encounter (Signed)
Referral and records faxed and pt aware. Pt provided the phone number to follow-up with office regarding appt.

## 2024-05-18 ENCOUNTER — Other Ambulatory Visit: Payer: Self-pay | Admitting: Internal Medicine

## 2024-06-16 ENCOUNTER — Ambulatory Visit: Admitting: Physician Assistant

## 2024-06-24 ENCOUNTER — Telehealth: Payer: Self-pay | Admitting: Internal Medicine

## 2024-06-24 MED ORDER — LINACLOTIDE 145 MCG PO CAPS: ORAL_CAPSULE | ORAL | 0 refills | Status: DC

## 2024-06-24 NOTE — Telephone Encounter (Signed)
 Received call from patient stating her prescription for Linzess  (145 mcg capsule) needs refilling before her upcoming appointment, Request Refill sent to [facility] [place]. Requesting confirmation when sent. Please advise and review  Thank you

## 2024-06-24 NOTE — Telephone Encounter (Signed)
 Prescription for Linzess  sent to patient's pharmacy until scheduled appt.

## 2024-07-01 ENCOUNTER — Encounter: Payer: Self-pay | Admitting: Internal Medicine

## 2024-07-10 ENCOUNTER — Other Ambulatory Visit: Payer: Self-pay | Admitting: Family Medicine

## 2024-07-10 DIAGNOSIS — Z1231 Encounter for screening mammogram for malignant neoplasm of breast: Secondary | ICD-10-CM

## 2024-07-21 ENCOUNTER — Ambulatory Visit
Admission: RE | Admit: 2024-07-21 | Discharge: 2024-07-21 | Disposition: A | Source: Ambulatory Visit | Attending: Family Medicine | Admitting: Family Medicine

## 2024-07-21 ENCOUNTER — Other Ambulatory Visit: Payer: Self-pay | Admitting: Obstetrics & Gynecology

## 2024-07-21 DIAGNOSIS — Z1231 Encounter for screening mammogram for malignant neoplasm of breast: Secondary | ICD-10-CM

## 2024-07-23 ENCOUNTER — Other Ambulatory Visit: Payer: Self-pay | Admitting: Obstetrics & Gynecology

## 2024-07-23 ENCOUNTER — Other Ambulatory Visit: Payer: Self-pay | Admitting: Internal Medicine

## 2024-07-23 DIAGNOSIS — N644 Mastodynia: Secondary | ICD-10-CM

## 2024-08-04 NOTE — Progress Notes (Unsigned)
 Alicia Console, PA-C [address] [place], [place]  [address] Phone: [phone]   Primary Care Physician: Barbette Knock, MD  Primary Gastroenterologist:  Alicia Console, PA-C / Dr. Gordy Starch   Chief Complaint:  F/U Chronic GI symptoms; Discuss repeat EGD / Colon       HPI:   Discussed the use of AI scribe software for clinical note transcription with the patient, who gave verbal consent to proceed.  She has GI history of adenomatous colon polyps, GERD, fatty liver, IBS with diarrhea and constipation, obesity who is seen for annual follow-up.  She last saw Dr. Starch for follow-up 04/2023.   History of Present Illness She has a history of Nissen fundoplication surgery for hiatal hernia in 1998. In January 2022, she underwent an upper endoscopy and colonoscopy, during which three small tubular adenoma colon polyps were removed. EGD showed mild gastritis and biopsies for H. pylori were negative. She was advised to repeat Colonoscopy in three years.  She has been attending [facility] for a year and has lost almost 100 pounds, reducing her weight from 342 pounds. Initially, she was on Wegovy but experienced constipation and increased GI side effects, and then was switched to Zepbound 3 months ago. She was started on Linzess  145 last fall, which helped initially but now requires occasional Dulcolax for relief. She is currently on a 10 mg dose of Zepbound, with plans to increase to 12.5 mg. She experiences significant acid reflux, burning, and sore throat pain with dose increases.  Her current medications include Linzess  145 mcg daily, pantoprazole  40 mg twice daily, and famotidine 20 mg twice daily. She also uses Miralax in her coffee every morning. Despite these, she experiences bloating and a sensation of fullness after small meals, and her bowel movements are less frequent than before, sometimes requiring Dulcolax every 7-8 days.  She has a bowel movement every 2 or  3 days.  Denies rectal bleeding.  She follows a diet of high protein shakes and low-carb, high-protein snacks, avoiding heavy foods like potatoes, bread, and rice due to difficulty in digestion. She is concerned about the potential for increased constipation with the higher dose of Zepbound and the impact of her diet changes.  She reports a new concern of a right breast lump, which she describes as 'pretty big,' and has an upcoming diagnostic appointment next week for this issue.  10/2020 EGD: Intact Nissen wrap.  Mild gastritis.  Biopsies negative for H. pylori.  10/2020 colonoscopy: 3 adenomatous polyps removed (largest 9 mm).  Diverticulosis.  Small internal hemorrhoids.  3-year repeat (was due 10/2023).   Current Outpatient Medications  Medication Sig Dispense Refill   acetaminophen  (TYLENOL ) 650 MG CR tablet Take 2 tablets by mouth daily as needed.      aspirin 81 MG tablet Take 81 mg by mouth daily.     Azelastine-Fluticasone 137-50 MCG/ACT SUSP Place into the nose.     Calcium Carb-Cholecalciferol (CALCIUM + VITAMIN D3 PO) Take 600 mg by mouth daily.     cetirizine (ZYRTEC) 10 MG tablet Take 10 mg by mouth daily.     hyoscyamine  (LEVSIN  SL) 0.125 MG SL tablet Place 1 tablet (0.125 mg total) under the tongue every 8 (eight) hours as needed. 30 tablet 1   linaclotide  (LINZESS ) 290 MCG CAPS capsule Take 1 capsule (290 mcg total) by mouth daily before breakfast. 90 capsule 3   losartan (COZAAR) 50 MG tablet Take 50 mg by mouth  daily.     meloxicam (MOBIC) 15 MG tablet Take 15 mg by mouth daily.     metaxalone (SKELAXIN) 800 MG tablet Take 800 mg by mouth 3 (three) times daily.     Multiple Vitamins-Minerals (ONE-A-DAY WOMENS 50 PLUS PO) Take 1 capsule by mouth daily.     Omega-3 Fatty Acids (FISH OIL) 1200 MG CAPS Take 2 capsules by mouth daily.     pantoprazole  (PROTONIX ) 40 MG tablet Take 1 tablet (40 mg total) by mouth daily. (Patient taking differently: Take 40 mg by mouth 2 (two) times  daily.) 30 tablet 1   polyethylene glycol powder (GLYCOLAX/MIRALAX) 17 GM/SCOOP powder Take 17 g by mouth daily. Dissolve 1 capful (17g) in 4-8 ounces of liquid and take by mouth daily.     PROBIOTIC PRODUCT PO Take 1 capsule by mouth daily.     Sodium Sulfate-Mag Sulfate-KCl (SUTAB ) 1479-225-188 MG TABS Use as directed for colonoscopy. MANUFACTURER CODES!! BIN: [id number] PCN: CN GROUP: [id number] MEMBER ID: [id number];MLW AS SECONDARY INSURANCE ;NO PRIOR AUTHORIZATION 24 tablet 0   triamterene-hydrochlorothiazide (MAXZIDE) 75-50 MG per tablet Take 1 tablet by mouth daily.     ZEPBOUND 10 MG/0.5ML Pen SMARTSIG:0.5 Milliliter(s) SUB-Q Once a Week     No current facility-administered medications for this visit.    Allergies as of 08/05/2024 - Review Complete 08/05/2024  Allergen Reaction Noted   Albuterol     Codeine Itching 02/16/2016   Tramadol  Itching 12/25/2012   Lexapro [escitalopram oxalate] Itching 11/28/2014    Past Medical History:  Diagnosis Date   Cystitis, interstitial    Diverticulosis of colon (without mention of hemorrhage)    DJD (degenerative joint disease)    Fatty liver    GERD (gastroesophageal reflux disease)    Headache(784.0)    Hiatal hernia    Internal hemorrhoids    Irritable bowel syndrome    Miscarriage    Panic disorder    Tubular adenoma of colon 2010   Unspecified essential hypertension    Unspecified urinary incontinence     Past Surgical History:  Procedure Laterality Date   ABDOMINAL HYSTERECTOMY     COLONOSCOPY N/A 12/29/2012   Procedure: COLONOSCOPY;  Surgeon: Alm JONELLE Gander, MD;  Location: [facility];  Service: Endoscopy;  Laterality: N/A;   deviated septrum     NASAL SINUS SURGERY     NISSEN FUNDOPLICATION     SKIN GRAFT     right ear   TYMPANOSTOMY TUBE PLACEMENT     x 10    Review of Systems:    All systems reviewed and negative except where noted in HPI.    Physical Exam:  BP 122/74   Pulse 74   Ht 5' 4 (1.626 m)   Wt  248 lb 8 oz (112.7 kg)   SpO2 97%   BMI 42.65 kg/m  No LMP recorded. Patient has had a hysterectomy.  General: Well-nourished, well-developed in no acute distress.  Lungs: Clear to auscultation bilaterally. Non-labored. Heart: Regular rate and rhythm, no murmurs rubs or gallops.  Abdomen: Bowel sounds are normal; Abdomen is Soft; No hepatosplenomegaly, masses or hernias; mild generalized abdominal Tenderness; No guarding or rebound tenderness. Neuro: Alert and oriented x 3.  Grossly intact.  Psych: Alert and cooperative, normal mood and affect.  Imaging Studies: No results found.  Labs: CBC    Component Value Date/Time   WBC 6.0 01/29/2017 1128   RBC 4.70 01/29/2017 1128   HGB 13.2 01/29/2017 1128   HCT 39.8 01/29/2017 1128  PLT 293.0 01/29/2017 1128   MCV 84.7 01/29/2017 1128   MCHC 33.2 01/29/2017 1128   RDW 14.3 01/29/2017 1128   LYMPHSABS 1.9 01/29/2017 1128   MONOABS 0.4 01/29/2017 1128   EOSABS 0.1 01/29/2017 1128   BASOSABS 0.1 01/29/2017 1128    CMP     Component Value Date/Time   NA 138 01/29/2017 1128   K 4.2 01/29/2017 1128   CL 102 01/29/2017 1128   CO2 29 01/29/2017 1128   GLUCOSE 94 01/29/2017 1128   BUN 14 01/29/2017 1128   CREATININE 0.69 01/29/2017 1128   CALCIUM 9.5 01/29/2017 1128   PROT 7.2 01/29/2017 1128   ALBUMIN 4.4 01/29/2017 1128   AST 19 01/29/2017 1128   ALT 3 01/29/2017 1128   ALKPHOS 72 01/29/2017 1128   BILITOT 0.5 01/29/2017 1128   GFRNONAA 82.72 02/25/2009 1131   GFRAA 141 12/19/2006 0959       Assessment and Plan:   Koda Defrank is a 58 y.o. y/o female presents for follow-up of chronic GI issues:  1.  Chronic constipation: Due to irritable bowel syndrome and GLP-1 medication. - Per patient request, will increase dose of Linzess  to 290 mcg once daily, #90, 3 RF.  2.  GERD: Fair control on high-dose PPI and H2 RB.  Likely increased GERD symptoms due to GLP-1 medication. - We discussed switching to a different  PPI.  Patient has previously tried and failed omeprazole, Nexium, Prevacid, and Dexilant.  Pantoprazole  has worked the best. - Patient wants to continue pantoprazole  40 mg twice daily and famotidine 20 mg twice daily for now. -  She may Add an antacids like Tums, Mylanta, or Maalox as needed for breakthrough symptoms. - GERD diet. - If she has worsening acid reflux in the future, consider trial of Voquezna. - Scheduling repeat EGD to evaluate for esophagitis. I discussed risks of EGD with patient to include risk of bleeding, perforation, and risk of sedation.  Patient expressed understanding and agrees to proceed with EGD.   3.  Adenomatous colon polyps: Due for 3-year repeat colonoscopy - Scheduling Colonoscopy I discussed risks of colonoscopy with patient to include risk of bleeding, colon perforation, and risk of sedation.  Patient expressed understanding and agrees to proceed with colonoscopy.   4. Obesity, currently managed with GLP-1 agonists  Obesity managed with GLP-1 agonist Zepbound, currently at 10 mg. Plan to increase to 12.5 mg. Discussed potential GI symptom exacerbation with dosage increase. - She will continue to follow-up at [facility]. - Monitor for exacerbation of GI symptoms and revert to lower dose GLP-1 if necessary.     Alicia Console, PA-C  Follow up as needed based on EGD and colonoscopy results and GI symptoms.

## 2024-08-05 ENCOUNTER — Encounter: Payer: Self-pay | Admitting: Physician Assistant

## 2024-08-05 ENCOUNTER — Ambulatory Visit: Admitting: Physician Assistant

## 2024-08-05 VITALS — BP 122/74 | HR 74 | Ht 64.0 in | Wt 248.5 lb

## 2024-08-05 DIAGNOSIS — K581 Irritable bowel syndrome with constipation: Secondary | ICD-10-CM

## 2024-08-05 DIAGNOSIS — Z860100 Personal history of colon polyps, unspecified: Secondary | ICD-10-CM

## 2024-08-05 DIAGNOSIS — K219 Gastro-esophageal reflux disease without esophagitis: Secondary | ICD-10-CM

## 2024-08-05 MED ORDER — SUTAB 1479-225-188 MG PO TABS: ORAL_TABLET | ORAL | 0 refills | Status: AC

## 2024-08-05 MED ORDER — LINACLOTIDE 290 MCG PO CAPS: 290.0000 ug | ORAL_CAPSULE | Freq: Every day | ORAL | 3 refills | Status: AC

## 2024-08-05 NOTE — Patient Instructions (Addendum)
 We have sent the following medications to your pharmacy for you to pick up at your convenience: Linzess  290 mcg once daily before breakfast  You have been scheduled for an Endoscopy and Colonoscopy. Please follow the written instructions given to you at your visit today.  If you use inhalers (even only as needed), please bring them with you on the day of your procedure.  DO NOT TAKE 7 DAYS PRIOR TO TEST- Trulicity (dulaglutide) Ozempic, Wegovy (semaglutide) Mounjaro (tirzepatide) Bydureon Bcise (exanatide extended release)  DO NOT TAKE 1 DAY PRIOR TO YOUR TEST Rybelsus (semaglutide) Adlyxin (lixisenatide) Victoza (liraglutide) Byetta (exanatide) ___________________________________________________________________________  Please follow up sooner if symptoms increase or worsen __________________________________________________________________________  Due to recent changes in healthcare laws, you may see the results of your imaging and laboratory studies on MyChart before your provider has had a chance to review them.  We understand that in some cases there may be results that are confusing or concerning to you. Not all laboratory results come back in the same time frame and the provider may be waiting for multiple results in order to interpret others.  Please give us  48 hours in order for your provider to thoroughly review all the results before contacting the office for clarification of your results.   Thank you for trusting me with your gastrointestinal care!   Ellouise Console, PA-C _______________________________________________________  If your blood pressure at your visit was 140/90 or greater, please contact your primary care physician to follow up on this.  _______________________________________________________  If you are age 33 or older, your body mass index should be between 23-30. Your Body mass index is 42.65 kg/m. If this is out of the aforementioned range listed, please  consider follow up with your Primary Care Provider.  If you are age 29 or younger, your body mass index should be between 19-25. Your Body mass index is 42.65 kg/m. If this is out of the aformentioned range listed, please consider follow up with your Primary Care Provider.   ________________________________________________________  The [facility] providers would like to encourage you to use MYCHART to communicate with providers for non-urgent requests or questions.  Due to long hold times on the telephone, sending your provider a message by [facility] may be a faster and more efficient way to get a response.  Please allow 48 business hours for a response.  Please remember that this is for non-urgent requests.  _______________________________________________________

## 2024-08-12 ENCOUNTER — Other Ambulatory Visit: Payer: Self-pay | Admitting: Obstetrics & Gynecology

## 2024-08-12 ENCOUNTER — Ambulatory Visit
Admission: RE | Admit: 2024-08-12 | Discharge: 2024-08-12 | Disposition: A | Discharge status: Home / Self Care | Source: Ambulatory Visit | Attending: Obstetrics & Gynecology | Admitting: Obstetrics & Gynecology

## 2024-08-12 ENCOUNTER — Ambulatory Visit
Admission: RE | Admit: 2024-08-12 | Discharge: 2024-08-12 | Disposition: A | Source: Ambulatory Visit | Attending: Obstetrics & Gynecology | Admitting: Obstetrics & Gynecology

## 2024-08-12 DIAGNOSIS — N644 Mastodynia: Secondary | ICD-10-CM

## 2024-08-12 DIAGNOSIS — N6321 Unspecified lump in the left breast, upper outer quadrant: Secondary | ICD-10-CM

## 2024-08-12 DIAGNOSIS — N6313 Unspecified lump in the right breast, lower outer quadrant: Secondary | ICD-10-CM

## 2024-08-12 DIAGNOSIS — N6322 Unspecified lump in the left breast, upper inner quadrant: Secondary | ICD-10-CM

## 2024-08-12 DIAGNOSIS — R599 Enlarged lymph nodes, unspecified: Secondary | ICD-10-CM

## 2024-08-17 ENCOUNTER — Ambulatory Visit
Admission: RE | Admit: 2024-08-17 | Discharge: 2024-08-17 | Disposition: A | Discharge status: Home / Self Care | Source: Ambulatory Visit | Attending: Obstetrics & Gynecology | Admitting: Obstetrics & Gynecology

## 2024-08-17 DIAGNOSIS — R599 Enlarged lymph nodes, unspecified: Secondary | ICD-10-CM

## 2024-08-17 DIAGNOSIS — N644 Mastodynia: Secondary | ICD-10-CM

## 2024-08-17 DIAGNOSIS — N6313 Unspecified lump in the right breast, lower outer quadrant: Secondary | ICD-10-CM

## 2024-08-17 HISTORY — PX: BREAST BIOPSY: SHX20

## 2024-08-18 LAB — SURGICAL PATHOLOGY

## 2024-08-19 ENCOUNTER — Telehealth: Payer: Self-pay | Admitting: *Deleted

## 2024-08-19 NOTE — Telephone Encounter (Signed)
 Patient mail box is full, sent email with paperwork with all the appointment information to ([email or web address])

## 2024-08-21 ENCOUNTER — Encounter: Payer: Self-pay | Admitting: *Deleted

## 2024-08-21 DIAGNOSIS — C50511 Malignant neoplasm of lower-outer quadrant of right female breast: Secondary | ICD-10-CM | POA: Insufficient documentation

## 2024-08-24 NOTE — Progress Notes (Signed)
 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 female seen in consultation for radiation therapy.  Alicia Bentley presented with breast pain/palpable lumps in the bilateral breast. She first noticed them around 1 month prior. She underwent a diagnostic bilateral mammogram and US  on 08/12/24 revealing a highly suspicious 1.8 cm mass in the R breast at 7 oclock and a suspicious R axillary LN with a cortical thickening of 9 mm as well as two benign lipomas in the L breast.   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  She presents today to the breast multidisciplinary clinic to discuss radiation pre-operatively.  Estrogen exposure history:  Childbearing/breastfeeding:   Family history of cancer:  PREVIOUS RADIATION THERAPY: {EXAM; YES/NO:19492::No}  AUTOIMMUNE DISEASE: {EXAM; YES/NO:19492::No}  MEDICAL DEVICES: {EXAM; YES/NO:19492::No}  PREGNANCY: {EXAM; YES/NO:19492::No}   PAST MEDICAL HISTORY:  Past Medical History:  Diagnosis Date   Cystitis, interstitial    Diverticulosis of colon (without mention of hemorrhage)    DJD (degenerative joint disease)    Fatty liver    GERD (gastroesophageal reflux disease)    Headache(784.0)    Hiatal hernia    Internal hemorrhoids    Irritable bowel syndrome    Miscarriage    Panic disorder    Tubular adenoma of colon 2010   Unspecified essential hypertension    Unspecified urinary incontinence        PAST  SURGICAL HISTORY: Past Surgical History:  Procedure Laterality Date   ABDOMINAL HYSTERECTOMY     BREAST BIOPSY Right 08/17/2024   US  RT BREAST BX W LOC DEV 1ST LESION IMG BX SPEC US  GUIDE 08/17/2024 [facility] MAMMOGRAPHY   COLONOSCOPY N/A 12/29/2012   Procedure: COLONOSCOPY;  Surgeon: Alm JONELLE Gander, MD;  Location: [facility];  Service: Endoscopy;  Laterality: N/A;   deviated septrum     NASAL SINUS SURGERY     NISSEN FUNDOPLICATION     SKIN GRAFT     right ear   TYMPANOSTOMY TUBE PLACEMENT     x 10     FAMILY HISTORY:  Family History  Problem Relation Age of Onset   Heart disease Mother    Non-Hodgkin's lymphoma Mother    Stroke Mother    Hypertension Mother    Deep vein thrombosis Mother    Diabetes Father    Heart disease Father    Dementia Father    Stroke Father    Melanoma Father        mets to lymph nodes   Hypertension Father    Pancreatic cancer Paternal Uncle    Stomach cancer Paternal Uncle        mets from pancreas   Pneumonia Maternal Grandmother    Other Maternal Grandfather        esophageal problems   Stroke Paternal Grandmother    Colon cancer Neg Hx    Esophageal cancer Neg Hx      SOCIAL HISTORY:  reports that  she has quit smoking. Her smoking use included cigarettes. She has never used smokeless tobacco. She reports that she does not drink alcohol and does not use drugs.   ALLERGIES: Albuterol, Codeine, Tramadol , and Lexapro [escitalopram oxalate]   MEDICATIONS:  Current Outpatient Medications  Medication Sig Dispense Refill   acetaminophen  (TYLENOL ) 650 MG CR tablet Take 2 tablets by mouth daily as needed.      aspirin 81 MG tablet Take 81 mg by mouth daily.     Azelastine-Fluticasone 137-50 MCG/ACT SUSP Place into the nose.     Calcium Carb-Cholecalciferol (CALCIUM + VITAMIN D3 PO) Take 600 mg by mouth daily.     cetirizine (ZYRTEC) 10 MG tablet Take 10 mg by mouth daily.     hyoscyamine  (LEVSIN  SL) 0.125 MG SL tablet Place 1 tablet  (0.125 mg total) under the tongue every 8 (eight) hours as needed. 30 tablet 1   linaclotide  (LINZESS ) 290 MCG CAPS capsule Take 1 capsule (290 mcg total) by mouth daily before breakfast. 90 capsule 3   losartan (COZAAR) 50 MG tablet Take 50 mg by mouth daily.     meloxicam (MOBIC) 15 MG tablet Take 15 mg by mouth daily.     metaxalone (SKELAXIN) 800 MG tablet Take 800 mg by mouth 3 (three) times daily.     Multiple Vitamins-Minerals (ONE-A-DAY WOMENS 50 PLUS PO) Take 1 capsule by mouth daily.     Omega-3 Fatty Acids (FISH OIL) 1200 MG CAPS Take 2 capsules by mouth daily.     pantoprazole  (PROTONIX ) 40 MG tablet Take 1 tablet (40 mg total) by mouth daily. (Patient taking differently: Take 40 mg by mouth 2 (two) times daily.) 30 tablet 1   polyethylene glycol powder (GLYCOLAX/MIRALAX) 17 GM/SCOOP powder Take 17 g by mouth daily. Dissolve 1 capful (17g) in 4-8 ounces of liquid and take by mouth daily.     PROBIOTIC PRODUCT PO Take 1 capsule by mouth daily.     Sodium Sulfate-Mag Sulfate-KCl (SUTAB ) 1479-225-188 MG TABS Use as directed for colonoscopy. MANUFACTURER CODES!! BIN: [id number] PCN: CN GROUP: [id number] MEMBER ID: [id number];[employer] AS SECONDARY INSURANCE ;NO PRIOR AUTHORIZATION 24 tablet 0   triamterene-hydrochlorothiazide (MAXZIDE) 75-50 MG per tablet Take 1 tablet by mouth daily.     ZEPBOUND 10 MG/0.5ML Pen SMARTSIG:0.5 Milliliter(s) SUB-Q Once a Week     No current facility-administered medications for this visit.     REVIEW OF SYSTEMS: The patient reports that s***/he is doing well overall and a review of symptoms is otherwise negative.      PHYSICAL EXAM:  Wt Readings from Last 3 Encounters:  08/05/24 248 lb 8 oz (112.7 kg)  05/02/23 (!) 337 lb (152.9 kg)  11/07/20 289 lb (131.1 kg)   Temp Readings from Last 3 Encounters:  11/07/20 98 F (36.7 C)  12/29/12 97.9 F (36.6 C) (Oral)   BP Readings from Last 3 Encounters:  08/05/24 122/74  11/07/20 113/74  09/05/20 126/88    Pulse Readings from Last 3 Encounters:  08/05/24 74  05/02/23 82  11/07/20 60    /10   Physical Exam   Breast Exam:  Well-healed lumpectomy/re-excision scar in the *** quadrant. No erythema, induration, or drainage. No palpable masses or nodularity at the surgical site. No skin changes, nipple inversion, or discharge.   ECOG = ***   LABORATORY DATA:  Lab Results  Component Value Date   WBC 6.0 01/29/2017   HGB 13.2 01/29/2017   HCT 39.8 01/29/2017   MCV 84.7 01/29/2017  PLT 293.0 01/29/2017   Lab Results  Component Value Date   NA 138 01/29/2017   K 4.2 01/29/2017   CL 102 01/29/2017   CO2 29 01/29/2017   Lab Results  Component Value Date   ALT 3 01/29/2017   AST 19 01/29/2017   ALKPHOS 72 01/29/2017   BILITOT 0.5 01/29/2017      RADIOGRAPHY: US  RT BREAST BX W LOC DEV 1ST LESION IMG BX SPEC US  GUIDE Addendum Date: 08/18/2024 ADDENDUM REPORT: 08/18/2024 13:27 ADDENDUM: PATHOLOGY revealed: Site 1. Breast, right, needle core biopsy, 7:00 6 cmfn (coil clip)- INVASIVE DUCTAL CARCINOMA- OVERALL GRADE: III/III- LYMPHOVASCULAR INVASION: NOT IDENTIFIED- CANCER LENGTH: 18 MM IN GREATEST LINEAR DIMENSION- CALCIFICATIONS: NOT IDENTIFIED Pathology results are CONCORDANT with imaging findings, per Dr. Aliene Mir. PATHOLOGY revealed: Site 2. Lymph node, needle/core biopsy, right axillary lymph node (clip hydromark)- METASTATIC CARCINOMA CONSISTENT WITH THE PATIENT'S KNOWN BREAST CARCINOMA (SEE PART 1)- 13 MM IN GREATEST LINEAR DIMENSION WITH FOCAL EVIDENCE OF EXTRACAPSULAR EXTENSION Pathology results are CONCORDANT with imaging findings, per Dr. Aliene Mir. Pathology results and recommendations below were discussed with patient by telephone on 08/18/2024 by Mliss Molt RN. Patient reported biopsy site within normal limits with slight tenderness at the site. Post biopsy care instructions were reviewed, questions were answered and my direct phone number was provided to patient.  Patient was instructed to call [facility] if any concerns or questions arise related to the biopsy. RECOMMENDATION: Surgical and oncological consultation. The patient was referred to [facility] with appointment on 08/26/24. Pathology results reported by Mliss Molt RN 08/18/2024. Electronically Signed   By: Aliene Lloyd M.D.   On: 08/18/2024 13:27   Result Date: 08/18/2024 CLINICAL DATA:  58 year old woman with highly suspicious 1.8 cm RIGHT breast mass and enlarged RIGHT axillary lymph node presents for ultrasound-guided core needle biopsies. EXAM: ULTRASOUND GUIDED RIGHT BREAST CORE NEEDLE BIOPSY ULTRASOUND-GUIDED RIGHT AXILLARY LYMPH NODE CORE NEEDLE BIOPSY COMPARISON:  Previous exam(s). PROCEDURE: I met with the patient and we discussed the procedure of ultrasound-guided biopsy, including benefits and alternatives. We discussed the high likelihood of a successful procedure. We discussed the risks of the procedure, including infection, bleeding, tissue injury, clip migration, and inadequate sampling. Informed written consent was given. The usual time-out protocol was performed immediately prior to the procedure. Site 1: RIGHT breast mass (7 o'clock 6 CMFN) Lesion quadrant: Lower outer quadrant Using sterile technique and 1% Lidocaine as local anesthetic, under direct ultrasound visualization, a 14 gauge spring-loaded device was used to perform biopsy of RIGHT breast mass using a lateral approach. At the conclusion of the procedure coil shaped tissue marker clip was deployed into the biopsy cavity. Follow up 2 view mammogram was performed and dictated separately. Site 2: RIGHT axillary lymph node Using sterile technique and 1% Lidocaine as local anesthetic, under direct ultrasound visualization, a 14 gauge spring-loaded device was used to perform biopsy of RIGHT axillary lymph node using a lateral approach. At  the conclusion of the procedure [facility] tissue marker clip was deployed into the biopsy cavity. Follow up 2 view mammogram was performed and dictated separately. IMPRESSION: Ultrasound guided biopsy of 1.8 cm RIGHT breast mass (7 o'clock 6 CMFN) and enlarged RIGHT axillary lymph node. No apparent complications. Electronically Signed: By: Aliene Lloyd M.D. On: 08/17/2024 09:29   US  AXILLARY NODE CORE BIOPSY RIGHT Addendum Date: 08/18/2024 ADDENDUM REPORT: 08/18/2024 13:27 ADDENDUM: PATHOLOGY revealed: Site 1. Breast,  right, needle core biopsy, 7:00 6 cmfn (coil clip)- INVASIVE DUCTAL CARCINOMA- OVERALL GRADE: III/III- LYMPHOVASCULAR INVASION: NOT IDENTIFIED- CANCER LENGTH: 18 MM IN GREATEST LINEAR DIMENSION- CALCIFICATIONS: NOT IDENTIFIED Pathology results are CONCORDANT with imaging findings, per Dr. Aliene Mir. PATHOLOGY revealed: Site 2. Lymph node, needle/core biopsy, right axillary lymph node (clip hydromark)- METASTATIC CARCINOMA CONSISTENT WITH THE PATIENT'S KNOWN BREAST CARCINOMA (SEE PART 1)- 13 MM IN GREATEST LINEAR DIMENSION WITH FOCAL EVIDENCE OF EXTRACAPSULAR EXTENSION Pathology results are CONCORDANT with imaging findings, per Dr. Aliene Mir. Pathology results and recommendations below were discussed with patient by telephone on 08/18/2024 by Mliss Molt RN. Patient reported biopsy site within normal limits with slight tenderness at the site. Post biopsy care instructions were reviewed, questions were answered and my direct phone number was provided to patient. Patient was instructed to call [facility] if any concerns or questions arise related to the biopsy. RECOMMENDATION: Surgical and oncological consultation. The patient was referred to [facility] at [facility] with appointment on 08/26/24. Pathology results reported by Mliss Molt RN 08/18/2024. Electronically Signed   By: Aliene Lloyd M.D.   On:  08/18/2024 13:27   Result Date: 08/18/2024 CLINICAL DATA:  58 year old woman with highly suspicious 1.8 cm RIGHT breast mass and enlarged RIGHT axillary lymph node presents for ultrasound-guided core needle biopsies. EXAM: ULTRASOUND GUIDED RIGHT BREAST CORE NEEDLE BIOPSY ULTRASOUND-GUIDED RIGHT AXILLARY LYMPH NODE CORE NEEDLE BIOPSY COMPARISON:  Previous exam(s). PROCEDURE: I met with the patient and we discussed the procedure of ultrasound-guided biopsy, including benefits and alternatives. We discussed the high likelihood of a successful procedure. We discussed the risks of the procedure, including infection, bleeding, tissue injury, clip migration, and inadequate sampling. Informed written consent was given. The usual time-out protocol was performed immediately prior to the procedure. Site 1: RIGHT breast mass (7 o'clock 6 CMFN) Lesion quadrant: Lower outer quadrant Using sterile technique and 1% Lidocaine as local anesthetic, under direct ultrasound visualization, a 14 gauge spring-loaded device was used to perform biopsy of RIGHT breast mass using a lateral approach. At the conclusion of the procedure coil shaped tissue marker clip was deployed into the biopsy cavity. Follow up 2 view mammogram was performed and dictated separately. Site 2: RIGHT axillary lymph node Using sterile technique and 1% Lidocaine as local anesthetic, under direct ultrasound visualization, a 14 gauge spring-loaded device was used to perform biopsy of RIGHT axillary lymph node using a lateral approach. At the conclusion of the procedure [facility] tissue marker clip was deployed into the biopsy cavity. Follow up 2 view mammogram was performed and dictated separately. IMPRESSION: Ultrasound guided biopsy of 1.8 cm RIGHT breast mass (7 o'clock 6 CMFN) and enlarged RIGHT axillary lymph node. No apparent complications. Electronically Signed: By: Aliene Lloyd M.D. On: 08/17/2024 09:29   MM CLIP PLACEMENT RIGHT Result Date:  08/17/2024 CLINICAL DATA:  Status post ultrasound-guided core needle biopsy of RIGHT breast mass and enlarged RIGHT axillary lymph node. EXAM: 3D DIAGNOSTIC RIGHT MAMMOGRAM POST ULTRASOUND BIOPSY (x2) COMPARISON:  Previous exam(s). ACR Breast Density Category a: The breasts are almost entirely fatty. FINDINGS: 3D Mammographic images were obtained following ultrasound guided biopsy of RIGHT breast mass and enlarged RIGHT axillary lymph node. The biopsy marking clips are in expected position at the sites of biopsy. IMPRESSION: Appropriate positioning of the coil and HydroMARK spiral shaped biopsy marking clips at the sites of biopsy in the lower outer RIGHT breast and RIGHT axilla. Final  Assessment: Post Procedure Mammograms for Marker Placement Electronically Signed   By: Aliene Lloyd M.D.   On: 08/17/2024 09:32   MM 3D DIAGNOSTIC MAMMOGRAM BILATERAL BREAST Result Date: 08/12/2024 CLINICAL DATA:  Patient presents with bilateral breast lumps, 1 on the right and 2 on the left. Lump on the right which is associated with some soreness was noted on self exam approximately 1 month ago. EXAM: DIGITAL DIAGNOSTIC BILATERAL MAMMOGRAM WITH TOMOSYNTHESIS AND CAD; ULTRASOUND RIGHT BREAST LIMITED; ULTRASOUND LEFT BREAST LIMITED TECHNIQUE: Bilateral digital diagnostic mammography and breast tomosynthesis was performed. The images were evaluated with computer-aided detection. ; Targeted ultrasound examination of the right breast was performed; Targeted ultrasound examination of the left breast was performed. COMPARISON:  Previous exam(s). ACR Breast Density Category a: The breasts are almost entirely fatty. FINDINGS: Right breast: There is a dense mass with lobulated and irregular margins in the inferior right breast corresponding to the palpable mass. The mass measures 1.9 cm in longest dimension. There are no other right breast masses, no areas of architectural distortion and no suspicious calcifications. Left breast: There  are no suspicious masses, areas of significant asymmetry, areas of architectural distortion or suspicious calcifications. There is an oval fat density the mass in the lateral left breast that corresponds to the palpable mass in this location. Fat density is also noted in the location of the medial breast in the location of the reported palpable lump. On physical exam, there are soft masses in the left lateral and medial breast. On the right, there is a firm mass just above the inframammary fold near 6 o'clock. Targeted right breast ultrasound is performed, showing a hypoechoic mass with partly microlobulated a mostly indistinct margins at 7 o'clock, 6 cm the nipple, measuring 1.8 x 1.5 x 1.6 cm. There is internal blood flow on color Doppler analysis. This corresponds to the palpable mass. In the right axilla there is a single abnormal lymph node with a maximum cortical thickness of 9 mm. Targeted left breast ultrasound performed, demonstrating a homogeneous, fat echogenicity lipoma at 10 o'clock, 8 cm the nipple, just below the skin, measuring 3.3 x 0.8 x 2.5 cm. At 3 o'clock, 7 cm the nipple, there is a similar appearing oval homogeneous fat echogenicity mass measuring 2.8 x 0.8 x 2.5 cm. These masses correspond to the palpable lumps. No suspicious left breast masses. IMPRESSION: 1. Highly suspicious, 1.8 cm mass in the right breast at 7 o'clock. Suspicious right axillary lymph node with a cortical thickening of 9 mm. Tissue sampling is indicated. 2. 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 RECOMMENDATION: 1. Ultrasound-guided core needle biopsies the 1.8 cm right breast mass and the single abnormal right axillary lymph node. These procedures were scheduled for 08/17/2024. I have discussed the findings and recommendations with the patient. If applicable, a reminder letter will be sent to the patient regarding the next appointment. BI-RADS CATEGORY  5: Highly suggestive of malignancy. Electronically Signed   By: Alm Parkins M.D.   On: 08/12/2024 15:26   US  LIMITED ULTRASOUND INCLUDING AXILLA RIGHT BREAST Result Date: 08/12/2024 CLINICAL DATA:  Patient presents with bilateral breast lumps, 1 on the right and 2 on the left. Lump on the right which is associated with some soreness was noted on self exam approximately 1 month ago. EXAM: DIGITAL DIAGNOSTIC BILATERAL MAMMOGRAM WITH TOMOSYNTHESIS AND CAD; ULTRASOUND RIGHT BREAST LIMITED; ULTRASOUND LEFT BREAST LIMITED TECHNIQUE: Bilateral digital diagnostic mammography and breast tomosynthesis was performed. The images were evaluated with computer-aided detection. ;  Targeted ultrasound examination of the right breast was performed; Targeted ultrasound examination of the left breast was performed. COMPARISON:  Previous exam(s). ACR Breast Density Category a: The breasts are almost entirely fatty. FINDINGS: Right breast: There is a dense mass with lobulated and irregular margins in the inferior right breast corresponding to the palpable mass. The mass measures 1.9 cm in longest dimension. There are no other right breast masses, no areas of architectural distortion and no suspicious calcifications. Left breast: There are no suspicious masses, areas of significant asymmetry, areas of architectural distortion or suspicious calcifications. There is an oval fat density the mass in the lateral left breast that corresponds to the palpable mass in this location. Fat density is also noted in the location of the medial breast in the location of the reported palpable lump. On physical exam, there are soft masses in the left lateral and medial breast. On the right, there is a firm mass just above the inframammary fold near 6 o'clock. Targeted right breast ultrasound is performed, showing a hypoechoic mass with partly microlobulated a mostly indistinct margins at 7 o'clock, 6 cm the nipple, measuring 1.8 x 1.5 x 1.6 cm. There is internal blood flow on color Doppler analysis. This corresponds to  the palpable mass. In the right axilla there is a single abnormal lymph node with a maximum cortical thickness of 9 mm. Targeted left breast ultrasound performed, demonstrating a homogeneous, fat echogenicity lipoma at 10 o'clock, 8 cm the nipple, just below the skin, measuring 3.3 x 0.8 x 2.5 cm. At 3 o'clock, 7 cm the nipple, there is a similar appearing oval homogeneous fat echogenicity mass measuring 2.8 x 0.8 x 2.5 cm. These masses correspond to the palpable lumps. No suspicious left breast masses. IMPRESSION: 1. Highly suspicious, 1.8 cm mass in the right breast at 7 o'clock. Suspicious right axillary lymph node with a cortical thickening of 9 mm. Tissue sampling is indicated. 2. 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 RECOMMENDATION: 1. Ultrasound-guided core needle biopsies the 1.8 cm right breast mass and the single abnormal right axillary lymph node. These procedures were scheduled for 08/17/2024. I have discussed the findings and recommendations with the patient. If applicable, a reminder letter will be sent to the patient regarding the next appointment. BI-RADS CATEGORY  5: Highly suggestive of malignancy. Electronically Signed   By: Alm Parkins M.D.   On: 08/12/2024 15:26   US  LIMITED ULTRASOUND INCLUDING AXILLA LEFT BREAST  Result Date: 08/12/2024 CLINICAL DATA:  Patient presents with bilateral breast lumps, 1 on the right and 2 on the left. Lump on the right which is associated with some soreness was noted on self exam approximately 1 month ago. EXAM: DIGITAL DIAGNOSTIC BILATERAL MAMMOGRAM WITH TOMOSYNTHESIS AND CAD; ULTRASOUND RIGHT BREAST LIMITED; ULTRASOUND LEFT BREAST LIMITED TECHNIQUE: Bilateral digital diagnostic mammography and breast tomosynthesis was performed. The images were evaluated with computer-aided detection. ; Targeted ultrasound examination of the right breast was performed; Targeted ultrasound examination of the left breast was performed. COMPARISON:  Previous exam(s). ACR  Breast Density Category a: The breasts are almost entirely fatty. FINDINGS: Right breast: There is a dense mass with lobulated and irregular margins in the inferior right breast corresponding to the palpable mass. The mass measures 1.9 cm in longest dimension. There are no other right breast masses, no areas of architectural distortion and no suspicious calcifications. Left breast: There are no suspicious masses, areas of significant asymmetry, areas of architectural distortion or suspicious calcifications. There is an oval  fat density the mass in the lateral left breast that corresponds to the palpable mass in this location. Fat density is also noted in the location of the medial breast in the location of the reported palpable lump. On physical exam, there are soft masses in the left lateral and medial breast. On the right, there is a firm mass just above the inframammary fold near 6 o'clock. Targeted right breast ultrasound is performed, showing a hypoechoic mass with partly microlobulated a mostly indistinct margins at 7 o'clock, 6 cm the nipple, measuring 1.8 x 1.5 x 1.6 cm. There is internal blood flow on color Doppler analysis. This corresponds to the palpable mass. In the right axilla there is a single abnormal lymph node with a maximum cortical thickness of 9 mm. Targeted left breast ultrasound performed, demonstrating a homogeneous, fat echogenicity lipoma at 10 o'clock, 8 cm the nipple, just below the skin, measuring 3.3 x 0.8 x 2.5 cm. At 3 o'clock, 7 cm the nipple, there is a similar appearing oval homogeneous fat echogenicity mass measuring 2.8 x 0.8 x 2.5 cm. These masses correspond to the palpable lumps. No suspicious left breast masses. IMPRESSION: 1. Highly suspicious, 1.8 cm mass in the right breast at 7 o'clock. Suspicious right axillary lymph node with a cortical thickening of 9 mm. Tissue sampling is indicated. 2. 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 RECOMMENDATION: 1. Ultrasound-guided core  needle biopsies the 1.8 cm right breast mass and the single abnormal right axillary lymph node. These procedures were scheduled for 08/17/2024. I have discussed the findings and recommendations with the patient. If applicable, a reminder letter will be sent to the patient regarding the next appointment. BI-RADS CATEGORY  5: Highly suggestive of malignancy. Electronically Signed   By: Alm Parkins M.D.   On: 08/12/2024 15:26     PATHOLOGY:  R Breast and R Axillary LN Biopsies 08/17/24:  Accession #: [id number] Patient Name: WILLY, VORCE Visit # : [id number]  MRN: [id number] Physician: Katha Callas DOB/Age August 11, 1966 (Age: 45) Gender: F Collected Date: 08/17/2024 Received Date: 08/17/2024  FINAL DIAGNOSIS       1. Breast, right, needle core biopsy, 7:00 6cmfn (coil clip) :      - INVASIVE DUCTAL CARCINOMA, SEE NOTE      - TUBULE FORMATION: SCORE 3/3      - NUCLEAR PLEOMORPHISM: SCORE 3/3      - MITOTIC COUNT: SCORE 2/3      - TOTAL SCORE: 8/9      - OVERALL GRADE: III/III      - LYMPHOVASCULAR INVASION: NOT IDENTIFIED      - CANCER LENGTH: 18 MM IN GREATEST LINEAR DIMENSION      - CALCIFICATIONS: NOT IDENTIFIED      - OTHER FINDINGS: N/A      NOTE:      DR. REBBECCA REVIEWED THE CASE AND CONCURS WITH THE INTERPRETATION.  A BREAST      PROGNOSTIC PROFILE (ER, PR, KI-67 AND HER2) IS PENDING AND WILL BE REPORTED IN      AN ADDENDUM.  THE [facility] [place] WAS NOTIFIED ON 08/18/2024.       2. 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 We discussed the role of breast irradiation in reducing the risk of local recurrence and improving long-term disease control.  We reviewed the logistics of treatment in detail, including the need for a CT simulation for treatment planning, followed by several days for contouring, dosimetry, and quality assurance prior to starting therapy.   The planned course of radiation therapy will consist of daily treatments, Monday through Friday, over approximately 4 weeks. Exact treatment regimen will depend on surgery and resultant pathology.  Each treatment session typically lasts only a few minutes, though positioning and setup may take additional time. The patient should plan to be in the department for approximately 45 minutes per visit. She will be evaluated by me at least once weekly during treatment to monitor for side effects, assess tolerance, and ensure it is safe to continue therapy.  We discussed acute and subacute side effects which are generally gradual in onset and typically include fatigue skin erythema, tanning or hyperpigmentation, edema or firmness, mild tenderness.  Less common but possible side effects include desquamation of the skin, decreased range of motion of this shoulder, and transient changes in texture.  Long-term risk such as cosmetic changes, rare risk of rib fracture, cardiopulmonary effects and very rare secondary malignancies were also reviewed.  Reassured patient that most acute side effects improve over weeks to months after completing therapy.    Patient verbalized understanding of these risks and benefits and she is in agreement with the plan. I will see her in follow-up after surgery. Final treatment planning will depend on her postoperative pathology report.   --  Total time spent today in  preparation for this visit was *** minutes. This included patient care, imaging and path review, documentation, multidisciplinary discussion and coordination of care and follow up.    Estefana HERO. Maritza, M.D.

## 2024-08-26 ENCOUNTER — Inpatient Hospital Stay

## 2024-08-26 ENCOUNTER — Encounter: Payer: Self-pay | Admitting: Physical Therapy

## 2024-08-26 ENCOUNTER — Inpatient Hospital Stay: Admitting: Licensed Clinical Social Worker

## 2024-08-26 ENCOUNTER — Ambulatory Visit
Admission: RE | Admit: 2024-08-26 | Discharge: 2024-08-26 | Disposition: A | Discharge status: Home / Self Care | Source: Ambulatory Visit | Attending: Radiation Oncology | Admitting: Radiation Oncology

## 2024-08-26 ENCOUNTER — Inpatient Hospital Stay: Attending: Hematology and Oncology | Admitting: Hematology and Oncology

## 2024-08-26 ENCOUNTER — Other Ambulatory Visit: Payer: Self-pay | Admitting: *Deleted

## 2024-08-26 ENCOUNTER — Encounter: Payer: Self-pay | Admitting: *Deleted

## 2024-08-26 ENCOUNTER — Ambulatory Visit: Payer: Self-pay | Admitting: General Surgery

## 2024-08-26 ENCOUNTER — Ambulatory Visit: Attending: General Surgery | Admitting: Physical Therapy

## 2024-08-26 ENCOUNTER — Other Ambulatory Visit: Payer: Self-pay

## 2024-08-26 VITALS — BP 140/62 | HR 64 | Temp 98.2°F | Resp 18 | Ht 64.02 in | Wt 253.0 lb

## 2024-08-26 DIAGNOSIS — Z1379 Encounter for other screening for genetic and chromosomal anomalies: Secondary | ICD-10-CM | POA: Diagnosis not present

## 2024-08-26 DIAGNOSIS — C50511 Malignant neoplasm of lower-outer quadrant of right female breast: Secondary | ICD-10-CM | POA: Insufficient documentation

## 2024-08-26 DIAGNOSIS — Z808 Family history of malignant neoplasm of other organs or systems: Secondary | ICD-10-CM | POA: Diagnosis not present

## 2024-08-26 DIAGNOSIS — Z170 Estrogen receptor positive status [ER+]: Secondary | ICD-10-CM | POA: Insufficient documentation

## 2024-08-26 DIAGNOSIS — Z803 Family history of malignant neoplasm of breast: Secondary | ICD-10-CM

## 2024-08-26 DIAGNOSIS — R293 Abnormal posture: Secondary | ICD-10-CM | POA: Insufficient documentation

## 2024-08-26 DIAGNOSIS — Z807 Family history of other malignant neoplasms of lymphoid, hematopoietic and related tissues: Secondary | ICD-10-CM | POA: Diagnosis not present

## 2024-08-26 DIAGNOSIS — Z800 Family history of malignant neoplasm of digestive organs: Secondary | ICD-10-CM

## 2024-08-26 DIAGNOSIS — Z8489 Family history of other specified conditions: Secondary | ICD-10-CM

## 2024-08-26 LAB — CMP (CANCER CENTER ONLY)
ALT: <5 U/L (ref 0–44)
AST: 16 U/L (ref 15–41)
Albumin: 4.5 g/dL (ref 3.5–5.0)
Alkaline Phosphatase: 91 U/L (ref 38–126)
Anion gap: 11 (ref 5–15)
BUN: 18 mg/dL (ref 6–20)
CO2: 28 mmol/L (ref 22–32)
Calcium: 10.3 mg/dL (ref 8.9–10.3)
Chloride: 99 mmol/L (ref 98–111)
Creatinine: 0.93 mg/dL (ref 0.44–1.00)
GFR, Estimated: >60 mL/min (ref >60)
Glucose, Bld: 92 mg/dL (ref 70–99)
Potassium: 3.9 mmol/L (ref 3.5–5.1)
Sodium: 137 mmol/L (ref 135–145)
Total Bilirubin: 0.5 mg/dL (ref 0.0–1.2)
Total Protein: 7.3 g/dL (ref 6.5–8.1)

## 2024-08-26 LAB — CBC WITH DIFFERENTIAL (CANCER CENTER ONLY)
Abs Immature Granulocytes: 0.02 K/uL (ref 0.00–0.07)
Basophils Absolute: 0 K/uL (ref 0.0–0.1)
Basophils Relative: 1 %
Eosinophils Absolute: 0.1 K/uL (ref 0.0–0.5)
Eosinophils Relative: 1 %
HCT: 39.8 % (ref 36.0–46.0)
Hemoglobin: 13.3 g/dL (ref 12.0–15.0)
Immature Granulocytes: 0 %
Lymphocytes Relative: 35 %
Lymphs Abs: 2.2 K/uL (ref 0.7–4.0)
MCH: 28 pg (ref 26.0–34.0)
MCHC: 33.4 g/dL (ref 30.0–36.0)
MCV: 83.8 fL (ref 80.0–100.0)
Monocytes Absolute: 0.4 K/uL (ref 0.1–1.0)
Monocytes Relative: 6 %
Neutro Abs: 3.7 K/uL (ref 1.7–7.7)
Neutrophils Relative %: 57 %
Platelet Count: 324 K/uL (ref 150–400)
RBC: 4.75 MIL/uL (ref 3.87–5.11)
RDW: 13.2 % (ref 11.5–15.5)
WBC Count: 6.4 K/uL (ref 4.0–10.5)
nRBC: 0 % (ref 0.0–0.2)

## 2024-08-26 LAB — GENETIC SCREENING ORDER

## 2024-08-26 NOTE — Progress Notes (Signed)
 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 Functionally triple negative due to minimal ER positivity (2%). High-grade, High Ki 67 40%.  Discussed chemotherapy and immunotherapy options, including Keynote 522 regimen vs carbo/docetaxel with immunotherapy based on NEOPACT. Given borderline PS, I recommended she consider less intensive regimen, but she is extremely worries its not as effective as KEYNOTE 522. Discussed Starlet trial participation. She is not interested. - We will proceed with MR breast neoadj, she also complained of new onset bone pain, so we will do a PET imaging. - Place port for chemotherapy. - Schedule chemotherapy class. - Coordinate with surgeon for potential lumpectomy post-treatment. - Plan radiation therapy post-surgery. - Consider maintenance immunotherapy post-surgery followed by anti estrogen therapy.  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 Chemotherapy may exacerbate constipation. - Continue Miralax and Linzess . - Monitor bowel movements during chemotherapy.  Essential hypertension Managed with triamterene and losartan. - Continue current antihypertensive regimen.  Obesity, currently managed with anti-obesity medication Obesity managed with Zepbound. Significant  weight loss achieved. No chemotherapy contraindications identified. - Continue Zepbound. - Monitor for drug interactions with chemotherapy.   HISTORY OF PRESENTING ILLNESS:  Alicia Bentley 58 y.o. female is here because of new diagnosis of right breast cancer  Discussed the use of AI scribe software for clinical note transcription with the patient, who gave verbal consent to proceed.  History of Present Illness Alicia Bentley is a 58 year old female with invasive ductal carcinoma who presents for oncology consultation. She is accompanied by her sister-in-law, Andres, and her husband, Abby.  She was diagnosed with invasive ductal carcinoma after discovering a lump in her right breast in September. The lump, which causes discomfort especially when lying on her stomach, was found on imaging to be located at the seven o'clock position and measured approximately 1.8 centimeters. It was confirmed through a mammogram and ultrasound, which also revealed an abnormal lymph node in the right axilla. Biopsies of both the breast mass and lymph node confirmed breast cancer.  The cancer is estrogen receptor-positive, with only 2% of the tumor staining for estrogen, PR neg and her 2 neg. She has a history of a total hysterectomy in 2011 due to a benign ovarian tumor and briefly took hormone replacement therapy post-surgery.  Her past medical history includes irritable bowel syndrome with constipation, managed with Miralax and Linzess , and hypertension, managed with triamterene and losartan. She is also on bupropion and has been using Zepbound for weight loss since January, losing a total of 96 pounds, with 45 pounds lost independently before starting the medication.  In terms of family history, there is no known history of breast cancer in her immediate family. However, a second cousin and a great aunt had breast cancer, and an uncle had liver cancer.  Socially, she is a stay-at-home mother,  having stopped working to care for her daughter, who is now 89 years old. Her husband works long  shifts, leaving early in the morning and returning late at night. She describes her activity level as low, with limited physical exertion, and acknowledges feeling tired and bogged down by her current situation.  In the review of symptoms, she reports night sweats, attributed to menopause Apparently she also reported some rib pain, sternal pain and back pain for the past 2 weeks.. She is concerned about the potential side effects of chemotherapy, including nausea, vomiting, and hair loss.  She is very anxious about treatment, prognosis and had lots of questions today written for us .  All other systems were reviewed with the patient and are negative.  MEDICAL HISTORY:  Past Medical History:  Diagnosis Date   Cystitis, interstitial    Diverticulosis of colon (without mention of hemorrhage)    DJD (degenerative joint disease)    Fatty liver    GERD (gastroesophageal reflux disease)    Headache(784.0)    Hiatal hernia    Internal hemorrhoids    Irritable bowel syndrome    Miscarriage    Panic disorder    Tubular adenoma of colon 2010   Unspecified essential hypertension    Unspecified urinary incontinence     SURGICAL HISTORY: Past Surgical History:  Procedure Laterality Date   ABDOMINAL HYSTERECTOMY     BREAST BIOPSY Right 08/17/2024   US  RT BREAST BX W LOC DEV 1ST LESION IMG BX SPEC US  GUIDE 08/17/2024 [facility] MAMMOGRAPHY   COLONOSCOPY N/A 12/29/2012   Procedure: COLONOSCOPY;  Surgeon: Alm JONELLE Gander, MD;  Location: [facility];  Service: Endoscopy;  Laterality: N/A;   deviated septrum     NASAL SINUS SURGERY     NISSEN FUNDOPLICATION     SKIN GRAFT     right ear   TYMPANOSTOMY TUBE PLACEMENT     x 10    SOCIAL HISTORY: Social History   Socioeconomic History   Marital status: Married    Spouse name: Not on file   Number of children: 0   Years of education: Not on file    Highest education level: Not on file  Occupational History   Occupation: housewife  Tobacco Use   Smoking status: Former    Types: Cigarettes   Smokeless tobacco: Never   Tobacco comments:    college years  Advertising Account Planner   Vaping status: Never Used  Substance and Sexual Activity   Alcohol use: No   Drug use: No   Sexual activity: Not on file  Other Topics Concern   Not on file  Social History Narrative   Adopted daughter   Social Drivers of Corporate Investment Banker Strain: Not on file  Food Insecurity: Not on file  Transportation Needs: Not on file  Physical Activity: Not on file  Stress: Not on file  Social Connections: Not on file  Intimate Partner Violence: Not on file    FAMILY HISTORY: Family History  Problem Relation Age of Onset   Heart disease Mother    Non-Hodgkin's lymphoma Mother    Stroke Mother    Hypertension Mother    Deep vein thrombosis Mother    Diabetes Father    Heart disease Father    Dementia Father    Stroke Father    Melanoma Father        mets to lymph nodes   Hypertension Father    Pancreatic cancer Paternal Uncle    Stomach cancer Paternal Uncle        mets from pancreas   Pneumonia Maternal Grandmother  Other Maternal Grandfather        esophageal problems   Stroke Paternal Grandmother    Colon cancer Neg Hx    Esophageal cancer Neg Hx     ALLERGIES:  is allergic to albuterol, codeine, tramadol , and lexapro [escitalopram oxalate].  MEDICATIONS:  Current Outpatient Medications  Medication Sig Dispense Refill   acetaminophen  (TYLENOL ) 650 MG CR tablet Take 2 tablets by mouth daily as needed.      aspirin 81 MG tablet Take 81 mg by mouth daily.     Azelastine-Fluticasone 137-50 MCG/ACT SUSP Place into the nose.     Calcium Carb-Cholecalciferol (CALCIUM + VITAMIN D3 PO) Take 600 mg by mouth daily.     cetirizine (ZYRTEC) 10 MG tablet Take 10 mg by mouth daily.     hyoscyamine  (LEVSIN  SL) 0.125 MG SL tablet Place 1 tablet  (0.125 mg total) under the tongue every 8 (eight) hours as needed. 30 tablet 1   linaclotide  (LINZESS ) 290 MCG CAPS capsule Take 1 capsule (290 mcg total) by mouth daily before breakfast. 90 capsule 3   losartan (COZAAR) 50 MG tablet Take 50 mg by mouth daily.     meloxicam (MOBIC) 15 MG tablet Take 15 mg by mouth daily.     metaxalone (SKELAXIN) 800 MG tablet Take 800 mg by mouth 3 (three) times daily.     Multiple Vitamins-Minerals (ONE-A-DAY WOMENS 50 PLUS PO) Take 1 capsule by mouth daily.     Omega-3 Fatty Acids (FISH OIL) 1200 MG CAPS Take 2 capsules by mouth daily.     pantoprazole  (PROTONIX ) 40 MG tablet Take 1 tablet (40 mg total) by mouth daily. (Patient taking differently: Take 40 mg by mouth 2 (two) times daily.) 30 tablet 1   polyethylene glycol powder (GLYCOLAX/MIRALAX) 17 GM/SCOOP powder Take 17 g by mouth daily. Dissolve 1 capful (17g) in 4-8 ounces of liquid and take by mouth daily.     PROBIOTIC PRODUCT PO Take 1 capsule by mouth daily.     Sodium Sulfate-Mag Sulfate-KCl (SUTAB ) 1479-225-188 MG TABS Use as directed for colonoscopy. MANUFACTURER CODES!! BIN: [id number] PCN: CN GROUP: [id number] MEMBER ID: [id number];MLW AS SECONDARY INSURANCE ;NO PRIOR AUTHORIZATION 24 tablet 0   triamterene-hydrochlorothiazide (MAXZIDE) 75-50 MG per tablet Take 1 tablet by mouth daily.     ZEPBOUND 10 MG/0.5ML Pen SMARTSIG:0.5 Milliliter(s) SUB-Q Once a Week     No current facility-administered medications for this visit.     PHYSICAL EXAMINATION: ECOG PERFORMANCE STATUS: 1 - Symptomatic but completely ambulatory  Vitals:   08/26/24 0840  BP: (!) 140/62  Pulse: 64  Resp: 18  Temp: 98.2 F (36.8 C)  SpO2: 99%   Filed Weights   08/26/24 0840  Weight: 253 lb (114.8 kg)    GENERAL:alert, no distress and comfortable, obese. LYMPH:  no palpable lymphadenopathy in the cervical, axillary  Breast: Right breast mass at 7 0 clock palpable measuring about 2 cms. LUNGS: clear to auscultation  and percussion with normal breathing effort HEART: regular rate & rhythm and no murmurs and no lower extremity edema ABDOMEN:abdomen soft, non-tender and normal bowel sounds Musculoskeletal:no cyanosis of digits and no clubbing  PSYCH: alert & oriented x 3 with fluent speech NEURO: no focal motor/sensory deficits  LABORATORY DATA:  I have reviewed the data as listed Lab Results  Component Value Date   WBC 6.0 01/29/2017   HGB 13.2 01/29/2017   HCT 39.8 01/29/2017   MCV 84.7 01/29/2017   PLT 293.0 01/29/2017  Chemistry      Component Value Date/Time   NA 138 01/29/2017 1128   K 4.2 01/29/2017 1128   CL 102 01/29/2017 1128   CO2 29 01/29/2017 1128   BUN 14 01/29/2017 1128   CREATININE 0.69 01/29/2017 1128      Component Value Date/Time   CALCIUM 9.5 01/29/2017 1128   ALKPHOS 72 01/29/2017 1128   AST 19 01/29/2017 1128   ALT 3 01/29/2017 1128   BILITOT 0.5 01/29/2017 1128       RADIOGRAPHIC STUDIES: I have personally reviewed the radiological images as listed and agreed with the findings in the report. US  RT BREAST BX W LOC DEV 1ST LESION IMG BX SPEC US  GUIDE Addendum Date: 08/18/2024 ADDENDUM REPORT: 08/18/2024 13:27 ADDENDUM: PATHOLOGY revealed: Site 1. Breast, right, needle core biopsy, 7:00 6 cmfn (coil clip)- INVASIVE DUCTAL CARCINOMA- OVERALL GRADE: III/III- LYMPHOVASCULAR INVASION: NOT IDENTIFIED- CANCER LENGTH: 18 MM IN GREATEST LINEAR DIMENSION- CALCIFICATIONS: NOT IDENTIFIED Pathology results are CONCORDANT with imaging findings, per Dr. Aliene Mir. PATHOLOGY revealed: Site 2. Lymph node, needle/core biopsy, right axillary lymph node (clip hydromark)- METASTATIC CARCINOMA CONSISTENT WITH THE PATIENT'S KNOWN BREAST CARCINOMA (SEE PART 1)- 13 MM IN GREATEST LINEAR DIMENSION WITH FOCAL EVIDENCE OF EXTRACAPSULAR EXTENSION Pathology results are CONCORDANT with imaging findings, per Dr. Aliene Mir. Pathology results and recommendations below were discussed with patient  by telephone on 08/18/2024 by Mliss Molt RN. Patient reported biopsy site within normal limits with slight tenderness at the site. Post biopsy care instructions were reviewed, questions were answered and my direct phone number was provided to patient. Patient was instructed to call [facility] if any concerns or questions arise related to the biopsy. RECOMMENDATION: Surgical and oncological consultation. The patient was referred to [facility] with appointment on 08/26/24. Pathology results reported by Mliss Molt RN 08/18/2024. Electronically Signed   By: Aliene Lloyd M.D.   On: 08/18/2024 13:27   Result Date: 08/18/2024 CLINICAL DATA:  58 year old woman with highly suspicious 1.8 cm RIGHT breast mass and enlarged RIGHT axillary lymph node presents for ultrasound-guided core needle biopsies. EXAM: ULTRASOUND GUIDED RIGHT BREAST CORE NEEDLE BIOPSY ULTRASOUND-GUIDED RIGHT AXILLARY LYMPH NODE CORE NEEDLE BIOPSY COMPARISON:  Previous exam(s). PROCEDURE: I met with the patient and we discussed the procedure of ultrasound-guided biopsy, including benefits and alternatives. We discussed the high likelihood of a successful procedure. We discussed the risks of the procedure, including infection, bleeding, tissue injury, clip migration, and inadequate sampling. Informed written consent was given. The usual time-out protocol was performed immediately prior to the procedure. Site 1: RIGHT breast mass (7 o'clock 6 CMFN) Lesion quadrant: Lower outer quadrant Using sterile technique and 1% Lidocaine  as local anesthetic, under direct ultrasound visualization, a 14 gauge spring-loaded device was used to perform biopsy of RIGHT breast mass using a lateral approach. At the conclusion of the procedure coil shaped tissue marker clip was deployed into the biopsy cavity. Follow up 2 view mammogram was performed and dictated separately.  Site 2: RIGHT axillary lymph node Using sterile technique and 1% Lidocaine  as local anesthetic, under direct ultrasound visualization, a 14 gauge spring-loaded device was used to perform biopsy of RIGHT axillary lymph node using a lateral approach. At the conclusion of the procedure [facility] tissue marker clip was deployed into the biopsy cavity. Follow up 2 view mammogram was performed and dictated separately. IMPRESSION: Ultrasound guided biopsy of 1.8 cm RIGHT breast mass (7  o'clock 6 CMFN) and enlarged RIGHT axillary lymph node. No apparent complications. Electronically Signed: By: Aliene Lloyd M.D. On: 08/17/2024 09:29   US  AXILLARY NODE CORE BIOPSY RIGHT Addendum Date: 08/18/2024 ADDENDUM REPORT: 08/18/2024 13:27 ADDENDUM: PATHOLOGY revealed: Site 1. Breast, right, needle core biopsy, 7:00 6 cmfn (coil clip)- INVASIVE DUCTAL CARCINOMA- OVERALL GRADE: III/III- LYMPHOVASCULAR INVASION: NOT IDENTIFIED- CANCER LENGTH: 18 MM IN GREATEST LINEAR DIMENSION- CALCIFICATIONS: NOT IDENTIFIED Pathology results are CONCORDANT with imaging findings, per Dr. Aliene Mir. PATHOLOGY revealed: Site 2. Lymph node, needle/core biopsy, right axillary lymph node (clip hydromark)- METASTATIC CARCINOMA CONSISTENT WITH THE PATIENT'S KNOWN BREAST CARCINOMA (SEE PART 1)- 13 MM IN GREATEST LINEAR DIMENSION WITH FOCAL EVIDENCE OF EXTRACAPSULAR EXTENSION Pathology results are CONCORDANT with imaging findings, per Dr. Aliene Mir. Pathology results and recommendations below were discussed with patient by telephone on 08/18/2024 by Mliss Molt RN. Patient reported biopsy site within normal limits with slight tenderness at the site. Post biopsy care instructions were reviewed, questions were answered and my direct phone number was provided to patient. Patient was instructed to call [facility] if any concerns or questions arise related to the biopsy. RECOMMENDATION: Surgical and oncological consultation. The  patient was referred to [facility] with appointment on 08/26/24. Pathology results reported by Mliss Molt RN 08/18/2024. Electronically Signed   By: Aliene Lloyd M.D.   On: 08/18/2024 13:27   Result Date: 08/18/2024 CLINICAL DATA:  58 year old woman with highly suspicious 1.8 cm RIGHT breast mass and enlarged RIGHT axillary lymph node presents for ultrasound-guided core needle biopsies. EXAM: ULTRASOUND GUIDED RIGHT BREAST CORE NEEDLE BIOPSY ULTRASOUND-GUIDED RIGHT AXILLARY LYMPH NODE CORE NEEDLE BIOPSY COMPARISON:  Previous exam(s). PROCEDURE: I met with the patient and we discussed the procedure of ultrasound-guided biopsy, including benefits and alternatives. We discussed the high likelihood of a successful procedure. We discussed the risks of the procedure, including infection, bleeding, tissue injury, clip migration, and inadequate sampling. Informed written consent was given. The usual time-out protocol was performed immediately prior to the procedure. Site 1: RIGHT breast mass (7 o'clock 6 CMFN) Lesion quadrant: Lower outer quadrant Using sterile technique and 1% Lidocaine as local anesthetic, under direct ultrasound visualization, a 14 gauge spring-loaded device was used to perform biopsy of RIGHT breast mass using a lateral approach. At the conclusion of the procedure coil shaped tissue marker clip was deployed into the biopsy cavity. Follow up 2 view mammogram was performed and dictated separately. Site 2: RIGHT axillary lymph node Using sterile technique and 1% Lidocaine as local anesthetic, under direct ultrasound visualization, a 14 gauge spring-loaded device was used to perform biopsy of RIGHT axillary lymph node using a lateral approach. At the conclusion of the procedure [facility] tissue marker clip was deployed into the biopsy cavity. Follow up 2 view mammogram was performed and dictated separately. IMPRESSION:  Ultrasound guided biopsy of 1.8 cm RIGHT breast mass (7 o'clock 6 CMFN) and enlarged RIGHT axillary lymph node. No apparent complications. Electronically Signed: By: Aliene Lloyd M.D. On: 08/17/2024 09:29   MM CLIP PLACEMENT RIGHT Result Date: 08/17/2024 CLINICAL DATA:  Status post ultrasound-guided core needle biopsy of RIGHT breast mass and enlarged RIGHT axillary lymph node. EXAM: 3D DIAGNOSTIC RIGHT MAMMOGRAM POST ULTRASOUND BIOPSY (x2) COMPARISON:  Previous exam(s). ACR Breast Density Category a: The breasts are almost entirely fatty. FINDINGS: 3D Mammographic images were obtained following ultrasound guided biopsy of RIGHT breast mass and enlarged RIGHT axillary  lymph node. The biopsy marking clips are in expected position at the sites of biopsy. IMPRESSION: Appropriate positioning of the coil and HydroMARK spiral shaped biopsy marking clips at the sites of biopsy in the lower outer RIGHT breast and RIGHT axilla. Final Assessment: Post Procedure Mammograms for Marker Placement Electronically Signed   By: Aliene Lloyd M.D.   On: 08/17/2024 09:32   MM 3D DIAGNOSTIC MAMMOGRAM BILATERAL BREAST Result Date: 08/12/2024 CLINICAL DATA:  Patient presents with bilateral breast lumps, 1 on the right and 2 on the left. Lump on the right which is associated with some soreness was noted on self exam approximately 1 month ago. EXAM: DIGITAL DIAGNOSTIC BILATERAL MAMMOGRAM WITH TOMOSYNTHESIS AND CAD; ULTRASOUND RIGHT BREAST LIMITED; ULTRASOUND LEFT BREAST LIMITED TECHNIQUE: Bilateral digital diagnostic mammography and breast tomosynthesis was performed. The images were evaluated with computer-aided detection. ; Targeted ultrasound examination of the right breast was performed; Targeted ultrasound examination of the left breast was performed. COMPARISON:  Previous exam(s). ACR Breast Density Category a: The breasts are almost entirely fatty. FINDINGS: Right breast: There is a dense mass with lobulated and irregular  margins in the inferior right breast corresponding to the palpable mass. The mass measures 1.9 cm in longest dimension. There are no other right breast masses, no areas of architectural distortion and no suspicious calcifications. Left breast: There are no suspicious masses, areas of significant asymmetry, areas of architectural distortion or suspicious calcifications. There is an oval fat density the mass in the lateral left breast that corresponds to the palpable mass in this location. Fat density is also noted in the location of the medial breast in the location of the reported palpable lump. On physical exam, there are soft masses in the left lateral and medial breast. On the right, there is a firm mass just above the inframammary fold near 6 o'clock. Targeted right breast ultrasound is performed, showing a hypoechoic mass with partly microlobulated a mostly indistinct margins at 7 o'clock, 6 cm the nipple, measuring 1.8 x 1.5 x 1.6 cm. There is internal blood flow on color Doppler analysis. This corresponds to the palpable mass. In the right axilla there is a single abnormal lymph node with a maximum cortical thickness of 9 mm. Targeted left breast ultrasound performed, demonstrating a homogeneous, fat echogenicity lipoma at 10 o'clock, 8 cm the nipple, just below the skin, measuring 3.3 x 0.8 x 2.5 cm. At 3 o'clock, 7 cm the nipple, there is a similar appearing oval homogeneous fat echogenicity mass measuring 2.8 x 0.8 x 2.5 cm. These masses correspond to the palpable lumps. No suspicious left breast masses. IMPRESSION: 1. Highly suspicious, 1.8 cm mass in the right breast at 7 o'clock. Suspicious right axillary lymph node with a cortical thickening of 9 mm. Tissue sampling is indicated. 2. 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 RECOMMENDATION: 1. Ultrasound-guided core needle biopsies the 1.8 cm right breast mass and the single abnormal right axillary lymph node. These procedures were scheduled for  08/17/2024. I have discussed the findings and recommendations with the patient. If applicable, a reminder letter will be sent to the patient regarding the next appointment. BI-RADS CATEGORY  5: Highly suggestive of malignancy. Electronically Signed   By: Alm Parkins M.D.   On: 08/12/2024 15:26   US  LIMITED ULTRASOUND INCLUDING AXILLA RIGHT BREAST Result Date: 08/12/2024 CLINICAL DATA:  Patient presents with bilateral breast lumps, 1 on the right and 2 on the left. Lump on the right which is associated with some soreness  was noted on self exam approximately 1 month ago. EXAM: DIGITAL DIAGNOSTIC BILATERAL MAMMOGRAM WITH TOMOSYNTHESIS AND CAD; ULTRASOUND RIGHT BREAST LIMITED; ULTRASOUND LEFT BREAST LIMITED TECHNIQUE: Bilateral digital diagnostic mammography and breast tomosynthesis was performed. The images were evaluated with computer-aided detection. ; Targeted ultrasound examination of the right breast was performed; Targeted ultrasound examination of the left breast was performed. COMPARISON:  Previous exam(s). ACR Breast Density Category a: The breasts are almost entirely fatty. FINDINGS: Right breast: There is a dense mass with lobulated and irregular margins in the inferior right breast corresponding to the palpable mass. The mass measures 1.9 cm in longest dimension. There are no other right breast masses, no areas of architectural distortion and no suspicious calcifications. Left breast: There are no suspicious masses, areas of significant asymmetry, areas of architectural distortion or suspicious calcifications. There is an oval fat density the mass in the lateral left breast that corresponds to the palpable mass in this location. Fat density is also noted in the location of the medial breast in the location of the reported palpable lump. On physical exam, there are soft masses in the left lateral and medial breast. On the right, there is a firm mass just above the inframammary fold near 6 o'clock.  Targeted right breast ultrasound is performed, showing a hypoechoic mass with partly microlobulated a mostly indistinct margins at 7 o'clock, 6 cm the nipple, measuring 1.8 x 1.5 x 1.6 cm. There is internal blood flow on color Doppler analysis. This corresponds to the palpable mass. In the right axilla there is a single abnormal lymph node with a maximum cortical thickness of 9 mm. Targeted left breast ultrasound performed, demonstrating a homogeneous, fat echogenicity lipoma at 10 o'clock, 8 cm the nipple, just below the skin, measuring 3.3 x 0.8 x 2.5 cm. At 3 o'clock, 7 cm the nipple, there is a similar appearing oval homogeneous fat echogenicity mass measuring 2.8 x 0.8 x 2.5 cm. These masses correspond to the palpable lumps. No suspicious left breast masses. IMPRESSION: 1. Highly suspicious, 1.8 cm mass in the right breast at 7 o'clock. Suspicious right axillary lymph node with a cortical thickening of 9 mm. Tissue sampling is indicated. 2. 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 RECOMMENDATION: 1. Ultrasound-guided core needle biopsies the 1.8 cm right breast mass and the single abnormal right axillary lymph node. These procedures were scheduled for 08/17/2024. I have discussed the findings and recommendations with the patient. If applicable, a reminder letter will be sent to the patient regarding the next appointment. BI-RADS CATEGORY  5: Highly suggestive of malignancy. Electronically Signed   By: Alm Parkins M.D.   On: 08/12/2024 15:26   US  LIMITED ULTRASOUND INCLUDING AXILLA LEFT BREAST  Result Date: 08/12/2024 CLINICAL DATA:  Patient presents with bilateral breast lumps, 1 on the right and 2 on the left. Lump on the right which is associated with some soreness was noted on self exam approximately 1 month ago. EXAM: DIGITAL DIAGNOSTIC BILATERAL MAMMOGRAM WITH TOMOSYNTHESIS AND CAD; ULTRASOUND RIGHT BREAST LIMITED; ULTRASOUND LEFT BREAST LIMITED TECHNIQUE: Bilateral digital diagnostic mammography and  breast tomosynthesis was performed. The images were evaluated with computer-aided detection. ; Targeted ultrasound examination of the right breast was performed; Targeted ultrasound examination of the left breast was performed. COMPARISON:  Previous exam(s). ACR Breast Density Category a: The breasts are almost entirely fatty. FINDINGS: Right breast: There is a dense mass with lobulated and irregular margins in the inferior right breast corresponding to the palpable mass. The mass  measures 1.9 cm in longest dimension. There are no other right breast masses, no areas of architectural distortion and no suspicious calcifications. Left breast: There are no suspicious masses, areas of significant asymmetry, areas of architectural distortion or suspicious calcifications. There is an oval fat density the mass in the lateral left breast that corresponds to the palpable mass in this location. Fat density is also noted in the location of the medial breast in the location of the reported palpable lump. On physical exam, there are soft masses in the left lateral and medial breast. On the right, there is a firm mass just above the inframammary fold near 6 o'clock. Targeted right breast ultrasound is performed, showing a hypoechoic mass with partly microlobulated a mostly indistinct margins at 7 o'clock, 6 cm the nipple, measuring 1.8 x 1.5 x 1.6 cm. There is internal blood flow on color Doppler analysis. This corresponds to the palpable mass. In the right axilla there is a single abnormal lymph node with a maximum cortical thickness of 9 mm. Targeted left breast ultrasound performed, demonstrating a homogeneous, fat echogenicity lipoma at 10 o'clock, 8 cm the nipple, just below the skin, measuring 3.3 x 0.8 x 2.5 cm. At 3 o'clock, 7 cm the nipple, there is a similar appearing oval homogeneous fat echogenicity mass measuring 2.8 x 0.8 x 2.5 cm. These masses correspond to the palpable lumps. No suspicious left breast masses.  IMPRESSION: 1. Highly suspicious, 1.8 cm mass in the right breast at 7 o'clock. Suspicious right axillary lymph node with a cortical thickening of 9 mm. Tissue sampling is indicated. 2. 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 RECOMMENDATION: 1. Ultrasound-guided core needle biopsies the 1.8 cm right breast mass and the single abnormal right axillary lymph node. These procedures were scheduled for 08/17/2024. I have discussed the findings and recommendations with the patient. If applicable, a reminder letter will be sent to the patient regarding the next appointment. BI-RADS CATEGORY  5: Highly suggestive of malignancy. Electronically Signed   By: Alm Parkins M.D.   On: 08/12/2024 15:26    All questions were answered. The patient knows to call the clinic with any problems, questions or concerns. I spent 75 minutes in the care of this patient including H and P, review of records, counseling and coordination of care. 60 minutes were spent face to face.     Amber Stalls, MD 08/26/2024 10:04 AM

## 2024-08-26 NOTE — Progress Notes (Signed)
 [facility] Clinical Social Work  Initial Assessment   Alicia Bentley is a 58 y.o. year old female accompanied by sister and husband. Clinical Social Work was referred by [facility] for assessment of psychosocial needs.   SDOH (Social Determinants of Health) assessments performed: Yes SDOH Interventions    Flowsheet Row Clinical Support from 08/26/2024 in [facility] [place]  SDOH Interventions   Food Insecurity Interventions Intervention Not Indicated  Housing Interventions Intervention Not Indicated  Transportation Interventions Intervention Not Indicated  Utilities Interventions Intervention Not Indicated    SDOH Screenings   Food Insecurity: No Food Insecurity (08/26/2024)  Housing: Low Risk  (08/26/2024)  Transportation Needs: No Transportation Needs (08/26/2024)  Utilities: Not At Risk (08/26/2024)  Depression (PHQ2-9): Low Risk  (08/26/2024)  Tobacco Use: Medium Risk (08/26/2024)  Health Literacy: Low Risk (03/28/2022)   Received from [facility]    PHQ 2/9:    08/26/2024   11:28 AM  Depression screen PHQ 2/9  Decreased Interest 0  Down, Depressed, Hopeless 0  PHQ - 2 Score 0     Distress Screen completed: No     No data to display            Family/Social Information:  Housing Arrangement: patient lives with husband and 17yo daughter Ronnald (HS senior) Family members/support persons in your life? Family Transportation concerns: no  Employment: Futures Trader. Previously worked in photographer Income source: Recruitment Consultant concerns: No Type of concern: None Food access concerns: no Religious or spiritual practice: Yes-strong faith Advanced directives: No Services Currently in place:  [employer]  Coping/ Adjustment to diagnosis: Patient understands treatment plan and what happens next? Pt is overwhelmed and still processing information Concerns about diagnosis and/or treatment: Overwhelmed by information  and worried about impact on daughter Patient reported stressors: Children, Anxiety/ nervousness, and Adjusting to my illness Current coping skills/ strengths: Motivation for treatment/growth  and Supportive family/friends     SUMMARY: Current SDOH Barriers:  Emotional adjustment to cancer  Clinical Social Work Clinical Goal(s):  No clinical social work goals at this time  Interventions: Discussed common feeling and emotions when being diagnosed with cancer, and the importance of support during treatment Informed patient of the support team roles and support services at [facility] Provided CSW contact information and encouraged patient to call with any questions or concerns   Follow Up Plan: Patient will contact CSW with any support or resource needs; CSW will follow pt periodically during treatment Patient verbalizes understanding of plan: Yes    Vernel Langenderfer E Sharry Beining, LCSW Clinical Social Worker [facility]

## 2024-08-26 NOTE — Addendum Note (Signed)
 Addended by: CLAUDENE FACTOR C on: 08/26/2024 12:20 PM   Modules accepted: Orders

## 2024-08-26 NOTE — Therapy (Addendum)
 OUTPATIENT PHYSICAL THERAPY BREAST CANCER BASELINE EVALUATION   Patient Name: Alicia Bentley MRN: [id number] DOB:1966/09/26, 58 y.o., female Today's Date: 08/26/2024  END OF SESSION:  PT End of Session - 08/26/24 1110     Visit Number 1    Number of Visits 2    Date for Recertification  02/23/25    PT Start Time 0902    PT Stop Time 0935   Also saw pt from 1145-1210 for a total of 58 min   PT Time Calculation (min) 33 min    Activity Tolerance Patient tolerated treatment well    Behavior During Therapy WFL for tasks assessed/performed          Past Medical History:  Diagnosis Date   Cystitis, interstitial    Diverticulosis of colon (without mention of hemorrhage)    DJD (degenerative joint disease)    Fatty liver    GERD (gastroesophageal reflux disease)    Headache(784.0)    Hiatal hernia    Internal hemorrhoids    Irritable bowel syndrome    Miscarriage    Panic disorder    Tubular adenoma of colon 2010   Unspecified essential hypertension    Unspecified urinary incontinence    Past Surgical History:  Procedure Laterality Date   ABDOMINAL HYSTERECTOMY     BREAST BIOPSY Right 08/17/2024   US  RT BREAST BX W LOC DEV 1ST LESION IMG BX SPEC US  GUIDE 08/17/2024 [facility] MAMMOGRAPHY   COLONOSCOPY N/A 12/29/2012   Procedure: COLONOSCOPY;  Surgeon: Alm JONELLE Gander, MD;  Location: [facility];  Service: Endoscopy;  Laterality: N/A;   deviated septrum     NASAL SINUS SURGERY     NISSEN FUNDOPLICATION     SKIN GRAFT     right ear   TYMPANOSTOMY TUBE PLACEMENT     x 10   Patient Active Problem List   Diagnosis Date Noted   Malignant neoplasm of lower-outer quadrant of right breast of female, estrogen receptor positive (HCC) 08/21/2024   Abdominal pain, epigastric 03/18/2020   Neuralgia, postherpetic 05/31/2016   Super obesity 05/31/2016   Cough 08/10/2013   DIARRHEA 02/25/2009   PANIC DISORDER 02/24/2009   HYPERTENSION 02/24/2009   GERD 02/24/2009    HIATAL HERNIA 02/24/2009   DIVERTICULOSIS, COLON 02/24/2009   IRRITABLE BOWEL SYNDROME 02/24/2009   DEGENERATIVE JOINT DISEASE 02/24/2009   HEADACHE, CHRONIC 02/24/2009   URINARY INCONTINENCE 02/24/2009   ALLERGY 02/24/2009    REFERRING PROVIDER: Dr. Deward Null  REFERRING DIAG: Right breast cancer  THERAPY DIAG:  Malignant neoplasm of lower-outer quadrant of right breast of female, estrogen receptor positive (HCC) - Plan: PT plan of care cert/re-cert  Abnormal posture - Plan: PT plan of care cert/re-cert  Rationale for Evaluation and Treatment: Rehabilitation  ONSET DATE: 10/13/2023  SUBJECTIVE:  SUBJECTIVE STATEMENT: Patient reports she is here today to be seen by her medical team for her newly diagnosed right breast cancer.   PERTINENT HISTORY:  Patient was diagnosed on 08/12/2024 with right grade 3 invasive ductal carcinoma breast cancer. It measures 1.8 cm and is located in the lower outer quadrant. It is weakly (2%) ER positive, PR and HER2 negative with a Ki67 of 40%.   PATIENT GOALS:   reduce lymphedema risk and learn post op HEP.   PAIN:  Are you having pain? Yes: NPRS scale: varies but was initially severe and now is less Pain location: right scapular area, sternum, and right lateral ribs; also has chronic left knee pain from OA Pain description: deep Aggravating factors: taking a deep breath Relieving factors: nothing  PRECAUTIONS: Active CA   RED FLAGS: None   HAND DOMINANCE: right  WEIGHT BEARING RESTRICTIONS: No  FALLS:  Has patient fallen in last 6 months? No  LIVING ENVIRONMENT: Patient lives with: her husband and 49 y.o. daughter Lives in: House/apartment Has following equipment at home: None  OCCUPATION: homemaker  LEISURE: She does not exercise  PRIOR LEVEL OF  FUNCTION: Independent   OBJECTIVE: Note: Objective measures were completed at Evaluation unless otherwise noted.  COGNITION: Overall cognitive status: Within functional limits for tasks assessed    POSTURE:  Forward head and rounded shoulders posture  UPPER EXTREMITY AROM/PROM:  A/PROM RIGHT   eval   Shoulder extension 55  Shoulder flexion 148  Shoulder abduction 140  Shoulder internal rotation 70  Shoulder external rotation 80    (Blank rows = not tested)  A/PROM LEFT   eval  Shoulder extension 47  Shoulder flexion 140  Shoulder abduction 140  Shoulder internal rotation 73  Shoulder external rotation 80    (Blank rows = not tested)  CERVICAL AROM: All within normal limits  UPPER EXTREMITY STRENGTH: WNL  LYMPHEDEMA ASSESSMENTS (in cm):   LANDMARK RIGHT   eval  10 cm proximal to olecranon process from proximal aspect of olecranon 39.2  Olecranon process 29.3  10 cm proximal to ulnar styloid process from proximal aspect of styloid process 28.1  Just distal to ulnar styloid process 17.8  Across hand at thumb web space 18.5  At base of 2nd digit 5.8  (Blank rows = not tested)  LANDMARK LEFT   eval  10 cm proximal to olecranon process from proximal aspect of olecranon 38.3   Olecranon process 28.8  10 cm proximal to ulnar styloid process from proximal aspect of styloid process 25.9  Just distal to ulnar styloid process 18  Across hand at thumb web space 17.5  At base of 2nd digit 5.8  (Blank rows = not tested)  L-DEX LYMPHEDEMA SCREENING:  The patient was assessed using the L-Dex machine today to produce a lymphedema index baseline score. The patient will be reassessed on a regular basis (typically every 3 months) to obtain new L-Dex scores. If the score is > 6.5 points away from his/her baseline score indicating onset of subclinical lymphedema, it will be recommended to wear a compression garment for 4 weeks, 12 hours per day and then be reassessed. If the  score continues to be > 6.5 points from baseline at reassessment, we will initiate lymphedema treatment. Assessing in this manner has a 95% rate of preventing clinically significant lymphedema.   L-DEX FLOWSHEETS - 08/26/24 1100       L-DEX LYMPHEDEMA SCREENING   Measurement Type Unilateral    L-DEX MEASUREMENT EXTREMITY Upper  Extremity    POSITION  Standing    DOMINANT SIDE Right    At Risk Side Right    BASELINE SCORE (UNILATERAL) -1.1          QUICK DASH SURVEY:  Alicia Bentley - 08/26/24 0001     Open a tight or new jar Mild difficulty    Do heavy household chores (wash walls, wash floors) Moderate difficulty    Carry a shopping bag or briefcase No difficulty    Wash your back No difficulty    Use a knife to cut food No difficulty    Recreational activities in which you take some force or impact through your arm, shoulder, or hand (golf, hammering, tennis) No difficulty    During the past week, to what extent has your arm, shoulder or hand problem interfered with your normal social activities with family, friends, neighbors, or groups? Not at all    During the past week, to what extent has your arm, shoulder or hand problem limited your work or other regular daily activities Slightly    Arm, shoulder, or hand pain. None    Tingling (pins and needles) in your arm, shoulder, or hand Mild    Difficulty Sleeping Moderate difficulty    DASH Score 15.91 %           PATIENT EDUCATION:  Education details: Time spent educating patient on aspects of self-care to maximize post op recovery. Patient was educated on where and how to get a post op compression bra to use to reduce post op edema. Patient was also educated on the use of SOZO screenings and surveillance principles for early identification of lymphedema onset. She was instructed to use the post op pillow in the axilla for pressure and pain relief. Patient educated on lymphedema risk reduction and post op shoulder/posture  HEP. Person educated: Patient Education method: Explanation, Demonstration, Handout Education comprehension: Patient verbalized understanding and returned demonstration  HOME EXERCISE PROGRAM: Patient was instructed today in a home exercise program today for post op shoulder range of motion. These included active assist shoulder flexion in sitting, scapular retraction, wall walking with shoulder abduction, and hands behind head external rotation.  She was encouraged to do these twice a day, holding 3 seconds and repeating 5 times when permitted by her physician.   ASSESSMENT:  CLINICAL IMPRESSION: Patient was diagnosed on 08/12/2024 with right grade 3 invasive ductal carcinoma breast cancer. It measures 1.8 cm and is located in the lower outer quadrant. It is weakly (2%) ER positive, PR and HER2 negative with a Ki67 of 40%. Her multidisciplinary medical team met prior to her assessments to determine a recommended treatment plan. She is planning to have neoadjuvant chemotherapy followed by a right lumpectomy and targeted axillary lymph node dissection, radiation, and possibly anti-estrogen therapy depending on final pathology. She will benefit from a post op PT reassessment to determine needs and from L-Dex screens every 3 months for 2 years to detect subclinical lymphedema.  Pt will benefit from skilled therapeutic intervention to improve on the following deficits: Decreased knowledge of precautions, impaired UE functional use, pain, decreased ROM, postural dysfunction.   PT treatment/interventions: ADL/self-care home management, pt/family education, therapeutic exercise  REHAB POTENTIAL: Excellent  CLINICAL DECISION MAKING: Stable/uncomplicated  EVALUATION COMPLEXITY: Low   GOALS: Goals reviewed with patient? YES  LONG TERM GOALS: (STG=LTG)    Name Target Date Goal status  1 Pt will be able to verbalize understanding of pertinent lymphedema risk reduction practices relevant to  her dx  specifically related to skin care.  Baseline:  No knowledge 08/26/2024 Achieved at eval  2 Pt will be able to return demo and/or verbalize understanding of the post op HEP related to regaining shoulder ROM. Baseline:  No knowledge 08/26/2024 Achieved at eval  3 Pt will be able to verbalize understanding of the importance of viewing the post op After Breast CA Class video for further lymphedema risk reduction education and therapeutic exercise.  Baseline:  No knowledge 08/26/2024 Achieved at eval  4 Pt will demo she has regained full shoulder ROM and function post operatively compared to baselines.  Baseline: See objective measurements taken today. 02/23/2025     PLAN:  PT FREQUENCY/DURATION: EVAL and 1 follow up appointment.   PLAN FOR NEXT SESSION: will reassess 3-4 weeks post op to determine needs.   Patient will follow up at outpatient cancer rehab 3-4 weeks following surgery.  If the patient requires physical therapy at that time, a specific plan will be dictated and sent to the referring physician for approval. The patient was educated today on appropriate basic range of motion exercises to begin post operatively and the importance of viewing the After Breast Cancer class video following surgery.  Patient was educated today on lymphedema risk reduction practices as it pertains to recommendations that will benefit the patient immediately following surgery.  She verbalized good understanding.    Physical Therapy Information for After Breast Cancer Surgery/Treatment:  Lymphedema is a swelling condition that you may be at risk for in your arm if you have lymph nodes removed from the armpit area.  After a sentinel node biopsy, the risk is approximately 5-9% and is higher after an axillary node dissection.  There is treatment available for this condition and it is not life-threatening.  Contact your physician or physical therapist with concerns. You may begin the 4 shoulder/posture exercises (see  additional sheet) when permitted by your physician (typically a week after surgery).  If you have drains, you may need to wait until those are removed before beginning range of motion exercises.  A general recommendation is to not lift your arms above shoulder height until drains are removed.  These exercises should be done to your tolerance and gently.  This is not a no pain/no gain type of recovery so listen to your body and stretch into the range of motion that you can tolerate, stopping if you have pain.  If you are having immediate reconstruction, ask your plastic surgeon about doing exercises as he or she may want you to wait. We encourage you to view the After Breast Cancer class video following surgery.  You will learn information related to lymphedema risk, prevention and treatment and additional exercises to regain mobility following surgery.   While undergoing any medical procedure or treatment, try to avoid blood pressure being taken or needle sticks from occurring on the arm on the side of cancer.   This recommendation begins after surgery and continues for the rest of your life.  This may help reduce your risk of getting lymphedema (swelling in your arm). An excellent resource for those seeking information on lymphedema is the National Lymphedema Network's web site. It can be accessed at www.lymphnet.org If you notice swelling in your hand, arm or breast at any time following surgery (even if it is many years from now), please contact your doctor or physical therapist to discuss this.  Lymphedema can be treated at any time but it is easier for you if  it is treated early on.  If you feel like your shoulder motion is not returning to normal in a reasonable amount of time, please contact your surgeon or physical therapist.  [facility] [phone]. [address], [address], [place] [address]  [facility] CLASS After Breast Cancer Class  After Breast Cancer Class  is a specially designed exercise class video to assist you in a safe recover after having breast cancer surgery.  In this video you will learn how to get back to full function whether your drains were just removed or if you had surgery a month ago. The video can be viewed on this page: [email or web address].boyd-meyer.org/ or on YouTube here: [email or web address].  Class Goals  Understand specific stretches to improve the flexibility of you chest and shoulder. Learn ways to safely strengthen your upper body and improve your posture. Understand the warning signs of infection and why you may be at risk for an arm infection. Learn about Lymphedema and prevention.  ** You do not need to view this video until after surgery.  Drains should be removed to participate in the recommended exercises on the video.  Patient was instructed today in a home exercise program today for post op shoulder range of motion. These included active assist shoulder flexion in sitting, scapular retraction, wall walking with shoulder abduction, and hands behind head external rotation.  She was encouraged to do these twice a day, holding 3 seconds and repeating 5 times when permitted by her physician.  Eward Wonda Sharps, [place] 08/26/24 12:19 PM

## 2024-08-26 NOTE — Progress Notes (Signed)
 START ON PATHWAY REGIMEN - Breast     Cycles 1 through 4: A cycle is every 21 days:     Pembrolizumab      Paclitaxel      Carboplatin      Filgrastim-xxxx    Cycles 5 through 8: A cycle is every 21 days:     Pembrolizumab      Doxorubicin      Cyclophosphamide      Pegfilgrastim-xxxx   **Always confirm dose/schedule in your pharmacy ordering system**  Patient Characteristics: Preoperative or Nonsurgical Candidate, M0 (Clinical Staging), Up to cT4c, Any N, M0, Neoadjuvant Therapy followed by Surgery, Invasive Disease, Chemotherapy, HER2 Negative, ER Negative, Platinum Therapy Indicated and Candidate for Checkpoint Inhibitor Therapeutic Status: Preoperative or Nonsurgical Candidate, M0 (Clinical Staging) AJCC M Category: cM0 AJCC Grade: G3 ER Status: Negative (-) AJCC 8 Stage Grouping: IIB HER2 Status: Negative (-) AJCC T Category: cT1c AJCC N Category: cN1 PR Status: Negative (-) Breast Surgical Plan: Neoadjuvant Therapy followed by Surgery Intent of Therapy: Curative Intent, Discussed with Patient

## 2024-08-27 ENCOUNTER — Other Ambulatory Visit: Payer: Self-pay

## 2024-08-27 ENCOUNTER — Telehealth: Payer: Self-pay | Admitting: Physician Assistant

## 2024-08-27 ENCOUNTER — Encounter: Payer: Self-pay | Admitting: Hematology and Oncology

## 2024-08-27 NOTE — Telephone Encounter (Signed)
 Inbound call from patient requesting a call back from Ellouise Console. 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 She states she was advised she would need to hold off the procedures probably until sometime during the summer due to chemo and radiation along with surgery. Patient is wanting to discuss further with Ellouise. Please advise.

## 2024-08-27 NOTE — Telephone Encounter (Signed)
 Patient's endoscopy and colonoscopy are cancelled at this time.

## 2024-08-28 ENCOUNTER — Other Ambulatory Visit: Payer: Self-pay | Admitting: Hematology and Oncology

## 2024-08-28 ENCOUNTER — Encounter: Payer: Self-pay | Admitting: Hematology and Oncology

## 2024-08-28 ENCOUNTER — Encounter (HOSPITAL_COMMUNITY)
Admission: RE | Admit: 2024-08-28 | Discharge: 2024-08-28 | Disposition: A | Discharge status: Home / Self Care | Source: Ambulatory Visit | Attending: Hematology and Oncology | Admitting: Hematology and Oncology

## 2024-08-28 ENCOUNTER — Telehealth: Payer: Self-pay | Admitting: *Deleted

## 2024-08-28 ENCOUNTER — Inpatient Hospital Stay

## 2024-08-28 DIAGNOSIS — Z807 Family history of other malignant neoplasms of lymphoid, hematopoietic and related tissues: Secondary | ICD-10-CM | POA: Insufficient documentation

## 2024-08-28 DIAGNOSIS — Z803 Family history of malignant neoplasm of breast: Secondary | ICD-10-CM | POA: Insufficient documentation

## 2024-08-28 DIAGNOSIS — C50511 Malignant neoplasm of lower-outer quadrant of right female breast: Secondary | ICD-10-CM | POA: Insufficient documentation

## 2024-08-28 DIAGNOSIS — Z800 Family history of malignant neoplasm of digestive organs: Secondary | ICD-10-CM | POA: Insufficient documentation

## 2024-08-28 DIAGNOSIS — Z170 Estrogen receptor positive status [ER+]: Secondary | ICD-10-CM | POA: Diagnosis present

## 2024-08-28 DIAGNOSIS — Z808 Family history of malignant neoplasm of other organs or systems: Secondary | ICD-10-CM | POA: Insufficient documentation

## 2024-08-28 DIAGNOSIS — Z8489 Family history of other specified conditions: Secondary | ICD-10-CM | POA: Insufficient documentation

## 2024-08-28 LAB — GLUCOSE, CAPILLARY: Glucose-Capillary: 90 mg/dL (ref 70–99)

## 2024-08-28 MED ORDER — FLUDEOXYGLUCOSE F - 18 (FDG) INJECTION
8.0000 | Freq: Once | INTRAVENOUS | Status: AC
  Administered 2024-08-28: 12.61 via INTRAVENOUS

## 2024-08-28 NOTE — Progress Notes (Signed)
 REFERRING PROVIDER: Curvin Deward MOULD, MD [address] [place],  [place] [address]  PRIMARY PROVIDER:  Barbette Knock, MD  PRIMARY REASON FOR VISIT:  1. Malignant neoplasm of lower-outer quadrant of right breast of female, estrogen receptor positive (HCC)   2. Family history of non-Hodgkin lymphoma   3. Family history of melanoma   4. Family history of liver cancer   5. Family history of colon cancer   6. Family history of breast cancer   7. Family history of brain tumor      HISTORY OF PRESENT ILLNESS:   Alicia Bentley, a 58 y.o. female, was seen on 08/26/2024 for a [place] cancer genetics consultation in the [facility] at the request of Dr. Curvin due to a personal history of breast cancer.  Alicia Bentley presents to clinic today to discuss the possibility of a hereditary predisposition to cancer, genetic testing, and to further clarify her future cancer risks, as well as potential cancer risks for family members.   CANCER HISTORY:  Oncology History  Malignant neoplasm of lower-outer quadrant of right breast of female, estrogen receptor positive (HCC)  08/21/2024 Initial Diagnosis   Malignant neoplasm of lower-outer quadrant of right breast of female, estrogen receptor positive (HCC)   08/26/2024 Cancer Staging   Staging form: Breast, AJCC 8th Edition - Clinical: Stage IIB (cT1c, cN1, cM0, G3, ER-, PR-, HER2-) - Signed by Loretha Ash, MD on 08/26/2024 Stage prefix: Initial diagnosis Histologic grading system: 3 grade system   09/15/2024 -  Chemotherapy   Patient is on Treatment Plan : BREAST Pembrolizumab (200) D1 + Carboplatin (1.5) D1,8,15 + Paclitaxel (80) D1,8,15 q21d X 4 cycles / Pembrolizumab (200) D1 + AC D1 q21d x 4 cycles      In 2025, at the age of 46, Alicia Bentley was diagnosed with invasive ductal carcinoma of the right breast, functionally triple negative due to minimal ER positivity (2%). The treatment plan is neoadjuvant chemotherapy,  potential lumpectomy radiation, and consider maintenance immunotherapy followed by anti-estrogen therapy.   RELEVANT MEDICAL HISTORY AND RISK FACTORS:  Menarche was at age 48.  First live birth at age n/a, no biological children.  OCP use for approximately about 11 years, used from age 75 to early 32s.  Ovaries intact: no. Removed due to a benign ovarian mass Hysterectomy: yes.  Menopausal status: postmenopausal.  HRT use: 1 years. Reports using for 1 year after hysterectomy Mammogram within the last year: yes. Breast density: A Number of breast biopsies: 1. Up to date with pelvic exams: yes. Colonoscopy: yes; had one 3 years ago and is due for another in December of this year. Colonoscopy 3 years ago showed 3 tubular adenomas. Any excessive radiation exposure in the past: no Other cancer screenings: no.  Exposures: no.   Past Medical History:  Diagnosis Date   Cystitis, interstitial    Diverticulosis of colon (without mention of hemorrhage)    DJD (degenerative joint disease)    Family history of brain tumor    Family history of breast cancer    Family history of colon cancer    Family history of liver cancer    Family history of melanoma    Family history of non-Hodgkin lymphoma    Fatty liver    GERD (gastroesophageal reflux disease)    Headache(784.0)    Hiatal hernia    Internal hemorrhoids    Irritable bowel syndrome    Miscarriage    Panic disorder    Tubular  adenoma of colon 2010   Unspecified essential hypertension    Unspecified urinary incontinence     Past Surgical History:  Procedure Laterality Date   ABDOMINAL HYSTERECTOMY     BREAST BIOPSY Right 08/17/2024   US  RT BREAST BX W LOC DEV 1ST LESION IMG BX SPEC US  GUIDE 08/17/2024 [facility] MAMMOGRAPHY   COLONOSCOPY N/A 12/29/2012   Procedure: COLONOSCOPY;  Surgeon: Alm JONELLE Gander, MD;  Location: [facility];  Service: Endoscopy;  Laterality: N/A;   deviated septrum     NASAL SINUS SURGERY     NISSEN  FUNDOPLICATION     SKIN GRAFT     right ear   TYMPANOSTOMY TUBE PLACEMENT     x 10    Social History   Socioeconomic History   Marital status: Married    Spouse name: Not on file   Number of children: 0   Years of education: Not on file   Highest education level: Not on file  Occupational History   Occupation: housewife  Tobacco Use   Smoking status: Former    Types: Cigarettes   Smokeless tobacco: Never   Tobacco comments:    college years  Vaping Use   Vaping status: Never Used  Substance and Sexual Activity   Alcohol use: No   Drug use: No   Sexual activity: Not on file  Other Topics Concern   Not on file  Social History Narrative   Adopted daughter   Social Drivers of Health   Financial Resource Strain: Not on file  Food Insecurity: No Food Insecurity (08/26/2024)   Hunger Vital Sign    Worried About Running Out of Food in the Last Year: Never true    Ran Out of Food in the Last Year: Never true  Transportation Needs: No Transportation Needs (08/26/2024)   PRAPARE - Administrator, Civil Service (Medical): No    Lack of Transportation (Non-Medical): No  Physical Activity: Not on file  Stress: Not on file  Social Connections: Not on file     FAMILY HISTORY:  We obtained a detailed, 4-generation family history.  Significant diagnoses are listed below: Family History  Problem Relation Age of Onset   Heart disease Mother    Non-Hodgkin's lymphoma Mother    Stroke Mother    Hypertension Mother    Deep vein thrombosis Mother    Diabetes Father    Heart disease Father    Dementia Father    Stroke Father    Melanoma Father        mets to lymph nodes   Hypertension Father    Pancreatic cancer Paternal Uncle    Stomach cancer Paternal Uncle        mets from pancreas   Pneumonia Maternal Grandmother    Other Maternal Grandfather        esophageal problems   Stroke Paternal Grandmother    Colon cancer Neg Hx    Esophageal cancer Neg Hx      [name] reports the following family history: Her mother was diagnosed with non-hodgkins lymphoma at age 75. She reports her mother gardened a lot and used a lot of Round-Up weed killer Her maternal first cousin once removed was diagnosed with breast cancer at an unknown age and had a mastectomy. She is living and is 59 years old Her maternal second cousin Slater was diagnosed with colon cancer in her 37s and died in her 27s Her other maternal second cousin Lauraine has a brain tumor  Her father was diagnosed with melanoma of his left arm with mets to his lymph nodes. He was a visual merchandiser and worked outside a lot Her paternal uncle was diagnosed with liver cancer. She reports he was in the [employer] and might have had harmful environmental exposures Her paternal great aunt was diagnosed with breast cancer in her 20s  Ms. North is unaware of previous family history of genetic testing for hereditary cancer risks.  There is no reported Ashkenazi Jewish ancestry. There is known consanguinity. Ms. Birchmeier reports her mother and father are either 4th or 5th cousins.   GENETIC COUNSELING ASSESSMENT:  Alicia Bentley is a 58 y.o. female with a personal history of triple negative breast cancer which is somewhat suggestive of a hereditary cancer syndrome and predisposition to cancer given this history. We, therefore, discussed and recommended the following at today's visit.   DISCUSSION: We discussed that, in general, most cancer is not inherited in families, but instead is sporadic or familial. Sporadic cancers occur by chance and typically happen at older ages (>50 years) as this type of cancer is caused by genetic changes acquired during an individual's lifetime. Some families have more cancers than would be expected by chance; however, the ages or types of cancer are not consistent with a known genetic mutation or known genetic mutations have been ruled out. This type of familial cancer is thought to be due to a  combination of multiple genetic, environmental, hormonal, and lifestyle factors. While this combination of factors likely increases the risk of cancer, the exact source of this risk is not currently identifiable or testable.  We discussed that 5 - 10% of breast cancer is hereditary. Most cases of hereditary breast cancer are associated with BRCA1 and BRCA2 genes, although there are other genes associated with hereditary breast cancer as well. Cancer risks and management strategies are gene specific. We discussed that genetic testing can beneficial for several reasons, including clarifying specific cancer risks, identifying potential screening and risk-reduction options that may be appropriate, and to understand if other family members could be at risk for cancer and allow them to undergo genetic testing to clarify their cancer risks.   We reviewed the characteristics, features and inheritance patterns of hereditary cancer syndromes. We also discussed genetic testing, including the appropriate family members to test, the process of testing, insurance coverage and turn-around-time for results. We discussed the implications of a negative, positive, carrier and/or variant of uncertain significant result.   Alicia Bentley  was offered the [facility]lus gene panel which includes sequencing and rearrangement analysis for the following 13 genes: ATM, BARD1, BRCA1, BRCA2, CDH1, CHEK2, NF1, PALB2, PTEN, RAD51C, RAD51D, STK11 and TP53 (sequencing and deletion/duplication). This is a stat panel  Alicia Bentley was offered the [facility] + RNAinsight gene panel which includes sequencing, rearrangement analysis, and RNA analysis for the following 40 genes: APC, ATM, BAP1, BARD1, BMPR1A, BRCA1, BRCA2, BRIP1, CDH1, CDKN2A, CHEK2, FH, FLCN, MET, MLH1, MSH2, MSH6, MUTYH, NF1, NTHL1, PALB2, PMS2, PTEN, RAD51C, RAD51D, RPS20, SMAD4, STK11, TP53, TSC1, TSC2, and VHL (sequencing and deletion/duplication); AXIN2, HOXB13, MBD4,  MSH3, POLD1 and POLE (sequencing only); EPCAM and GREM1 (deletion/duplication only).  Alicia Bentley was also offered the [facility]-Expanded + RNAinsight gene panel which includes sequencing, rearrangement, and RNA analysis for the following 77 genes: AIP, ALK, APC, ATM, AXIN2, BAP1, BARD1, BMPR1A, BRCA1, BRCA2, BRIP1, CDC73, CDH1, CDK4, CDKN1B, CDKN2A, CEBPA, CHEK2, CTNNA1, DDX41, DICER1, ETV6, FH, FLCN, GATA2, LZTR1, MAX, MBD4, MEN1, MET, MLH1, MSH2,  MSH3, MSH6, MUTYH, NF1, NF2, NTHL1, PALB2, PHOX2B, PMS2, POT1, PRKAR1A, PTCH1, PTEN, RAD51C, RAD51D, RB1, RET, RPS20, RUNX1, SDHA, SDHAF2, SDHB, SDHC, SDHD, SMAD4, SMARCA4, SMARCB1, SMARCE1, STK11, SUFU, TMEM127, TP53, TSC1, TSC2, VHL, and WT1 (sequencing and deletion/duplication); EGFR, HOXB13, KIT, MITF, PDGFRA, POLD1, and POLE (sequencing only); EPCAM and GREM1 (deletion/duplication only).    Alicia Bentley was informed of the benefits and limitations of each panel, including that expanded pan-cancer panels contain genes may not have clear management guidelines at this point in time. We also discussed that as the number of genes included on a panel increases, the chances of variants of uncertain significance increases. After considering the risks, benefits, and limitations, Alicia Bentley provided informed consent to pursue genetic testing. In order to get genetic test results in a timely manner so that Alicia Bentley can use these genetic test results for surgical decisions, we recommended Alicia Bentley pursue genetic testing for the [facility] 13 gene panel, which is a stat panel. This test will run concurrently with the [facility]- Expanded + RNA 77 gene panel.   Based on Ms. Sardinas's personal history of cancer, she meets medical criteria for genetic testing. she meets criteria due to her breast cancer being triple negative. Despite that she meets criteria, she may still have an out of pocket cost. We discussed that if her out of pocket cost for testing is  over $100, the laboratory will call and confirm whether she wants to proceed with testing.  If the out of pocket cost of testing is less than $100 she will be billed by the genetic testing laboratory.   We discussed that some people do not want to undergo genetic testing due to fear of genetic discrimination.  The Genetic Information Nondiscrimination Act (GINA) was signed into federal law in 2008. GINA prohibits health insurers and most employers from discriminating against individuals based on genetic information (including the results of genetic tests and family history information). According to GINA, health insurance companies cannot consider genetic information to be a preexisting condition, nor can they use it to make decisions regarding coverage or rates. GINA also makes it illegal for most employers to use genetic information in making decisions about hiring, firing, promotion, or terms of employment. It is important to note that GINA does not offer protections for life insurance, disability insurance, or long-term care insurance. GINA does not apply to those in the [employer], those who work for companies with less than 15 employees, and new life insurance or long-term disability insurance policies.  Health status due to a cancer diagnosis is not protected under GINA. More information about GINA can be found by visiting eliteclients.be.   PLAN: After considering the risks, benefits, and limitations, Alicia Bentley provided informed consent to pursue genetic testing and the blood sample was sent to [facility] for analysis of the [facility] 13 gene panel, results of which should be available in 7-10 days. This test will run concurrently with the [facility] CancerNext- Expanded + RNA 77 gene panel, results of which should be available in 2-3 weeks. Results will be disclosed by telephone to Alicia Bentley, as will any additional recommendations warranted by these results. Alicia Bentley will receive a  summary of her genetic counseling visit and a copy of her results once available. This information will also be available in Epic.   RESOURCES PROVIDED:  Alicia Bentley was provided with the following:  [facility] Billing information  [facility] Contact card   Lastly,  we encouraged Alicia Bentley to remain in contact with cancer genetics annually so that we can continuously update the family history and inform her of any changes in cancer genetics and testing that may be of benefit for this family.   Alicia Bentley questions were answered to her satisfaction today. Our contact information was provided should additional questions or concerns arise. Thank you for the referral and allowing us  to share in the care of your patient.   Alicia Bentley Laffey R. Bluford, MS, [facility] Certified [email or web address] phone: [phone]  I personally spent a total of 30 minutes in the care of the patient today including preparing to see the patient, getting/reviewing separately obtained history, counseling and educating, placing orders, referring and communicating with other health care professionals, and documenting clinical information in the EHR. The patient brought her husband and her friend. Drs. Lanny Stalls, and/or Gudena were available for questions, if needed.   _______________________________________________________________________ For Office Staff:  Number of people involved in session: 3 Was an Intern/ student involved with case: no

## 2024-08-28 NOTE — Progress Notes (Signed)
 START ON PATHWAY REGIMEN - Breast     Cycles 1 through 4: A cycle is every 21 days:     Pembrolizumab      Paclitaxel      Carboplatin      Filgrastim-xxxx    Cycles 5 through 8: A cycle is every 21 days:     Pembrolizumab      Doxorubicin      Cyclophosphamide      Pegfilgrastim-xxxx   **Always confirm dose/schedule in your pharmacy ordering system**  Patient Characteristics: Preoperative or Nonsurgical Candidate, M0 (Clinical Staging), Up to cT4c, Any N, M0, Neoadjuvant Therapy followed by Surgery, Invasive Disease, Chemotherapy, HER2 Negative, ER Negative, Platinum Therapy Indicated and Candidate for Checkpoint Inhibitor Therapeutic Status: Preoperative or Nonsurgical Candidate, M0 (Clinical Staging) AJCC M Category: cM0 AJCC Grade: G3 ER Status: Negative (-) AJCC 8 Stage Grouping: IIB HER2 Status: Negative (-) AJCC T Category: cT1c AJCC N Category: cN1 PR Status: Negative (-) Breast Surgical Plan: Neoadjuvant Therapy followed by Surgery Intent of Therapy: Curative Intent, Discussed with Patient

## 2024-08-28 NOTE — Telephone Encounter (Signed)
 Spoke with pt this morning to introduce some clinical research studies to pt. Pt stated she is not interested in participating in any clinical trials at all. Pt was Thanked for her time and consideration.   Mazie Larsen, RN, BSN Clinical Research Nurse [phone] 08/28/2024

## 2024-08-31 ENCOUNTER — Inpatient Hospital Stay: Admitting: Pharmacist

## 2024-08-31 ENCOUNTER — Telehealth: Payer: Self-pay | Admitting: *Deleted

## 2024-08-31 NOTE — Telephone Encounter (Signed)
 Called reading room for stat read on patient's PET scan.

## 2024-08-31 NOTE — Progress Notes (Unsigned)
  Cancer Center        Telephone: [phone]?Fax: [phone]   Oncology Clinical Pharmacist Practitioner Initial Assessment   Alicia Bentley is a 58 y.o. female with a diagnosis of breast cancer. They were contacted today via in-person visit.  Indication/Regimen Pembrolizumab (Keytruda), paclitaxel (Taxol), carboplatin (Paraplatin ) followed by pembrolizumab Sigmund), doxorubicin (Adriamycin), cyclophosphamide (Cytoxan) is being used appropriately for treatment of breast cancer by Dr. Amber Stalls.      Wt Readings from Last 1 Encounters:  09/01/24 253 lb 3.2 oz (114.9 kg)    Estimated body surface area is 2.28 meters squared as calculated from the following:   Height as of 08/26/24: 5' 4.02 (1.626 m).   Weight as of this encounter: 253 lb 3.2 oz (114.9 kg).  The dosing regimen cycle is every 21 days x 4 cycles  Pembrolizumab (200 mg) on Day 1 Paclitaxel (80 mg/m2) on Days 1, 8, 15 Carboplatin (AUC 1.5) on Day 1, 8, 15 Filgrastim (300 mcg or 480 mcg based on weight)  on Days 16, 17, 18  Followed by the below regimen cycle every 21 days x 4 cycles  Pembrolizumab (200 mg) Day 1 Doxorubicin (60 mg/m2) Day 1 Cyclophosphamide (600 mg/m2) Day 1 Pegfilgrastim (6 mg) on Day 3  It is planned to continue until treatment plan completion or unacceptable toxicity. The tentative start date is: 09/15/24  Dose Modifications Dr. Stalls reducing carboplatin from AUC 1.5 to AUC 1   Allergies Allergies  Allergen Reactions   Albuterol     REACTION: makes heart beat faster   Codeine Itching   Tramadol  Itching   Lexapro [Escitalopram Oxalate] Itching    Vitals    09/01/2024    9:21 AM 08/26/2024    8:40 AM 08/05/2024   11:08 AM  Oncology Vitals  Height  163 cm 163 cm  Weight 114.851 kg 114.76 kg 112.719 kg  Weight (lbs) 253 lbs 3 oz 253 lbs 248 lbs 8 oz  BMI 43.44 kg/m2 43.41 kg/m2 42.65 kg/m2  Temp 98.4 F (36.9 C) 98.2 F (36.8 C)   Pulse  Rate 73 64 74  BP 136/81 140/62 122/74  Resp 20 18   SpO2 99 % 99 % 97 %  BSA (m2) 2.28 m2 2.28 m2 2.26 m2     Laboratory Data    Latest Ref Rng & Units 08/26/2024   12:59 PM 01/29/2017   11:28 AM 04/14/2013    3:57 PM  CBC EXTENDED  WBC 4.0 - 10.5 K/uL 6.4  6.0  8.8   RBC 3.87 - 5.11 MIL/uL 4.75  4.70  4.79   Hemoglobin 12.0 - 15.0 g/dL 86.6  86.7  86.6   HCT 36.0 - 46.0 % 39.8  39.8  40.2   Platelets 150 - 400 K/uL 324  293.0  331.0   NEUT# 1.7 - 7.7 K/uL 3.7  3.6  5.8   Lymph# 0.7 - 4.0 K/uL 2.2  1.9  2.5        Latest Ref Rng & Units 08/26/2024   12:59 PM 01/29/2017   11:28 AM 04/14/2013    3:57 PM  CMP  Glucose 70 - 99 mg/dL 92  94  93   BUN 6 - 20 mg/dL 18  14  13    Creatinine 0.44 - 1.00 mg/dL 9.06  9.30  0.7   Sodium 135 - 145 mmol/L 137  138  139   Potassium 3.5 - 5.1 mmol/L 3.9  4.2  4.6  Chloride 98 - 111 mmol/L 99  102  103   CO2 22 - 32 mmol/L 28  29  31    Calcium 8.9 - 10.3 mg/dL 89.6  9.5  9.2   Total Protein 6.5 - 8.1 g/dL 7.3  7.2  7.7   Total Bilirubin 0.0 - 1.2 mg/dL 0.5  0.5  0.4   Alkaline Phos 38 - 126 U/L 91  72  76   AST 15 - 41 U/L 16  19  22    ALT 0 - 44 U/L 5  3  4     Contraindications Contraindications were reviewed? Yes Contraindications to therapy were identified? No   Safety Precautions The following safety precautions were reviewed:  Fever: reviewed the importance of having a thermometer and the Centers for Disease Control and Prevention (CDC) definition of fever which is 100.14F (38C) or higher. Patient should call 24/7 triage at [phone] if experiencing a fever or any other symptoms Decreased white blood cells (WBCs) and increased risk for infection Decreased platelet count and increased risk of bleeding Decreased hemoglobin, part of the red blood cells that carry iron and oxygen Nausea or vomiting Diarrhea or constipation Hair Loss Fatigue Changes in liver function Rash Peripheral Neuropathy Hypersensitivity  reactions Pembrolizumab toxicities (skin, lung, liver, thyroid, kidneys, vision) Changes in color of urine Mouth sores MDS/AML Grapefruit products may interact with paclitaxel. Avoid use Handling body fluids and waste Intimacy, sexual activity, contraception, fertility  Medication Reconciliation Current Outpatient Medications  Medication Sig Dispense Refill   famotidine (PEPCID) 20 MG tablet Take 20 mg by mouth at bedtime.     acetaminophen  (TYLENOL ) 650 MG CR tablet Take 2 tablets by mouth daily as needed.      aspirin 81 MG tablet Take 81 mg by mouth daily.     Azelastine-Fluticasone 137-50 MCG/ACT SUSP Place into the nose.     Calcium Carb-Cholecalciferol (CALCIUM + VITAMIN D3 PO) Take 600 mg by mouth daily.     cetirizine (ZYRTEC) 10 MG tablet Take 10 mg by mouth daily.     dexamethasone  (DECADRON ) 4 MG tablet Take 2 tablets daily for 2 days, start the day after chemotherapy. Take with food. 30 tablet 1   hyoscyamine  (LEVSIN  SL) 0.125 MG SL tablet Place 1 tablet (0.125 mg total) under the tongue every 8 (eight) hours as needed. 30 tablet 1   lidocaine -prilocaine  (EMLA ) cream Apply to affected area once 30 g 3   linaclotide  (LINZESS ) 290 MCG CAPS capsule Take 1 capsule (290 mcg total) by mouth daily before breakfast. 90 capsule 3   losartan (COZAAR) 50 MG tablet Take 50 mg by mouth daily.     meloxicam (MOBIC) 15 MG tablet Take 15 mg by mouth daily.     metaxalone (SKELAXIN) 800 MG tablet Take 800 mg by mouth 3 (three) times daily.     Multiple Vitamins-Minerals (ONE-A-DAY WOMENS 50 PLUS PO) Take 1 capsule by mouth daily.     Omega-3 Fatty Acids (FISH OIL) 1200 MG CAPS Take 2 capsules by mouth daily.     ondansetron  (ZOFRAN ) 8 MG tablet Take 1 tablet (8 mg total) by mouth every 8 (eight) hours as needed for nausea or vomiting. Start on the third day after chemotherapy. 30 tablet 1   pantoprazole  (PROTONIX ) 40 MG tablet Take 1 tablet (40 mg total) by mouth daily. (Patient taking  differently: Take 40 mg by mouth 2 (two) times daily.) 30 tablet 1   polyethylene glycol powder (GLYCOLAX/MIRALAX) 17 GM/SCOOP powder Take 17  g by mouth daily. Dissolve 1 capful (17g) in 4-8 ounces of liquid and take by mouth daily.     PROBIOTIC PRODUCT PO Take 1 capsule by mouth daily.     prochlorperazine  (COMPAZINE ) 10 MG tablet Take 1 tablet (10 mg total) by mouth every 6 (six) hours as needed for nausea or vomiting. 30 tablet 1   Sodium Sulfate-Mag Sulfate-KCl (SUTAB ) 1479-225-188 MG TABS Use as directed for colonoscopy. MANUFACTURER CODES!! BIN: [id number] PCN: CN GROUP: [id number] MEMBER ID: [id number];MLW AS SECONDARY INSURANCE ;NO PRIOR AUTHORIZATION 24 tablet 0   triamterene-hydrochlorothiazide (MAXZIDE) 75-50 MG per tablet Take 1 tablet by mouth daily.     No current facility-administered medications for this visit.   Medication reconciliation is based on the patient's most recent medication list in the electronic medical record (EMR) including herbal products and OTC medications.   The patient's medication list was reviewed today with the patient? Yes   Drug-drug interactions (DDIs) DDIs were evaluated? Yes Significant DDIs identified? No , reviewed potential interaction with PPIs and pembrolizumab and acetaminophen  and pembrolizumab  Drug-Food Interactions Drug-food interactions were evaluated? Yes Drug-food interactions identified? Avoid grapefruit products  Follow-up Plan  Treatment start date: 09/15/24 Port placement date: 09/14/24 ECHO date: 09/04/24 Prescriptions released to local pharmacy of choice We reviewed the prescriptions, premedications, and treatment regimen with the patient. Possible side effects of the treatment regimen were reviewed and management strategies were discussed.  Can use over-the-counter (OTC) options of loperamide (Imodium) as needed for diarrhea, loratadine (Claritin) as needed for G-CSF bone pain, and docusate + senna (Senna-S) as needed for  constipation.  Sent scheduling message that weekly chemotherapy regimens need to be rescheduled accordingly and not too soon. Clinical pharmacy will assist Dr. Praveena Iruku and Tish Johnson Zuccaro on an as needed basis going forward  Meade Louder Aung participated in the discussion, expressed understanding, and voiced agreement with the above plan. All questions were answered to her satisfaction. The patient was advised to contact the clinic at [phone] with any questions or concerns prior to her return visit.   I spent 60 minutes assessing the patient.  Heyden Jaber A. Lucila, PharmD, BCOP, CPP  Norleen DELENA Lucila, RPH-CPP, 09/01/2024 10:19 AM  **Disclaimer: This note was dictated with voice recognition software. Similar sounding words can inadvertently be transcribed and this note may contain transcription errors which may not have been corrected upon publication of note.**

## 2024-09-01 ENCOUNTER — Inpatient Hospital Stay: Admitting: Pharmacist

## 2024-09-01 ENCOUNTER — Telehealth: Payer: Self-pay | Admitting: *Deleted

## 2024-09-01 ENCOUNTER — Other Ambulatory Visit: Payer: Self-pay | Admitting: Hematology and Oncology

## 2024-09-01 ENCOUNTER — Encounter: Payer: Self-pay | Admitting: Hematology and Oncology

## 2024-09-01 ENCOUNTER — Ambulatory Visit
Admission: RE | Admit: 2024-09-01 | Discharge: 2024-09-01 | Disposition: A | Discharge status: Home / Self Care | Source: Ambulatory Visit | Attending: Hematology and Oncology | Admitting: Hematology and Oncology

## 2024-09-01 ENCOUNTER — Encounter: Payer: Self-pay | Admitting: *Deleted

## 2024-09-01 ENCOUNTER — Inpatient Hospital Stay

## 2024-09-01 VITALS — BP 136/81 | HR 73 | Temp 98.4°F | Resp 20 | Wt 253.2 lb

## 2024-09-01 DIAGNOSIS — C50511 Malignant neoplasm of lower-outer quadrant of right female breast: Secondary | ICD-10-CM

## 2024-09-01 MED ORDER — DEXAMETHASONE 4 MG PO TABS: ORAL_TABLET | ORAL | 1 refills | Status: AC

## 2024-09-01 MED ORDER — GADOPICLENOL 0.5 MMOL/ML IV SOLN
9.0000 mL | Freq: Once | INTRAVENOUS | Status: AC | PRN
  Administered 2024-09-01: 9 mL via INTRAVENOUS

## 2024-09-01 MED ORDER — ONDANSETRON HCL 8 MG PO TABS
8.0000 mg | ORAL_TABLET | Freq: Three times a day (TID) | ORAL | 1 refills | Status: AC | PRN
Start: 2024-09-01 — End: ?

## 2024-09-01 MED ORDER — PROCHLORPERAZINE MALEATE 10 MG PO TABS
10.0000 mg | ORAL_TABLET | Freq: Four times a day (QID) | ORAL | 1 refills | Status: AC | PRN
Start: 2024-09-01 — End: ?

## 2024-09-01 MED ORDER — LIDOCAINE-PRILOCAINE 2.5-2.5 % EX CREA: TOPICAL_CREAM | CUTANEOUS | 3 refills | Status: AC

## 2024-09-01 NOTE — Telephone Encounter (Signed)
 Spoke with patient to follow up from [facility] 11/19 and assess navigation needs. Patient was asking about her PET scan in which I informed her that it looked as though there was no evidence of distant metastatic disease although she is worried about the atherosclerosis that she saw. Informed her that she will be getting an echo and if there was anything concerning Dr. Loretha would let her know. Patient verbalized understanding. Encouraged her to call with any other questions or concerns.

## 2024-09-02 ENCOUNTER — Other Ambulatory Visit: Payer: Self-pay | Admitting: Pharmacist

## 2024-09-02 ENCOUNTER — Other Ambulatory Visit: Payer: Self-pay

## 2024-09-04 ENCOUNTER — Ambulatory Visit (HOSPITAL_COMMUNITY)
Admission: RE | Admit: 2024-09-04 | Discharge: 2024-09-04 | Disposition: A | Discharge status: Home / Self Care | Source: Ambulatory Visit | Attending: Hematology and Oncology | Admitting: Hematology and Oncology

## 2024-09-04 DIAGNOSIS — C50511 Malignant neoplasm of lower-outer quadrant of right female breast: Secondary | ICD-10-CM | POA: Diagnosis present

## 2024-09-04 DIAGNOSIS — Z170 Estrogen receptor positive status [ER+]: Secondary | ICD-10-CM

## 2024-09-04 DIAGNOSIS — Z0189 Encounter for other specified special examinations: Secondary | ICD-10-CM

## 2024-09-04 DIAGNOSIS — I10 Essential (primary) hypertension: Secondary | ICD-10-CM | POA: Insufficient documentation

## 2024-09-04 DIAGNOSIS — Z79899 Other long term (current) drug therapy: Secondary | ICD-10-CM | POA: Insufficient documentation

## 2024-09-04 DIAGNOSIS — Z87891 Personal history of nicotine dependence: Secondary | ICD-10-CM | POA: Diagnosis not present

## 2024-09-04 LAB — ECHOCARDIOGRAM COMPLETE
Area-P 1/2: 3.6 cm2
MV M vel: 4.17 m/s
MV Peak grad: 69.4 mmHg
S' Lateral: 2.7 cm

## 2024-09-04 NOTE — Progress Notes (Signed)
  Echocardiogram 2D Echocardiogram has been performed.  Koleen KANDICE Popper, RDCS 09/04/2024, 8:41 AM

## 2024-09-07 ENCOUNTER — Telehealth: Payer: Self-pay

## 2024-09-07 ENCOUNTER — Ambulatory Visit: Payer: Self-pay

## 2024-09-07 DIAGNOSIS — Z1379 Encounter for other screening for genetic and chromosomal anomalies: Secondary | ICD-10-CM

## 2024-09-07 DIAGNOSIS — C50511 Malignant neoplasm of lower-outer quadrant of right female breast: Secondary | ICD-10-CM

## 2024-09-07 DIAGNOSIS — Z808 Family history of malignant neoplasm of other organs or systems: Secondary | ICD-10-CM

## 2024-09-07 DIAGNOSIS — Z8489 Family history of other specified conditions: Secondary | ICD-10-CM

## 2024-09-07 DIAGNOSIS — Z800 Family history of malignant neoplasm of digestive organs: Secondary | ICD-10-CM

## 2024-09-07 DIAGNOSIS — Z807 Family history of other malignant neoplasms of lymphoid, hematopoietic and related tissues: Secondary | ICD-10-CM

## 2024-09-07 DIAGNOSIS — Z803 Family history of malignant neoplasm of breast: Secondary | ICD-10-CM

## 2024-09-07 NOTE — Progress Notes (Signed)
 HPI:  Ms. Hosie was previously seen in the [facility] Breast Multidisciplinary clinic due to a personal history of breast cancer and concerns regarding a hereditary predisposition to cancer. Please refer to our prior cancer genetics clinic note for more information regarding our discussion, assessment and recommendations, at the time. Ms. Haisley recent genetic test results were disclosed to her, as were recommendations warranted by these results. These results and recommendations are discussed in more detail below.  CANCER HISTORY:  Oncology History  Malignant neoplasm of lower-outer quadrant of right breast of female, estrogen receptor positive (HCC)  08/21/2024 Initial Diagnosis   Malignant neoplasm of lower-outer quadrant of right breast of female, estrogen receptor positive (HCC)   08/26/2024 Cancer Staging   Staging form: Breast, AJCC 8th Edition - Clinical: Stage IIB (cT1c, cN1, cM0, G3, ER-, PR-, HER2-) - Signed by Loretha Ash, MD on 08/26/2024 Stage prefix: Initial diagnosis Histologic grading system: 3 grade system   09/05/2024 Genetic Testing   Negative [facility] Cancer[facility]- Expanded + RNA insight. Report date 09/05/2024.  [facility] CancerNext-Expanded + RNAinsight gene panel includes sequencing, rearrangement, and RNA analysis for the following 77 genes: AIP, ALK, APC, ATM, AXIN2, BAP1, BARD1, BMPR1A, BRCA1, BRCA2, BRIP1, CDC73, CDH1, CDK4, CDKN1B, CDKN2A, CEBPA, CHEK2, CTNNA1, DDX41, DICER1, ETV6, FH, FLCN, GATA2, LZTR1, MAX, MBD4, MEN1, MET, MLH1, MSH2, MSH3, MSH6, MUTYH, NF1, NF2, NTHL1, PALB2, PHOX2B, PMS2, POT1, PRKAR1A, PTCH1, PTEN, RAD51C, RAD51D, RB1, RET, RPS20, RUNX1, SDHA, SDHAF2, SDHB, SDHC, SDHD, SMAD4, SMARCA4, SMARCB1, SMARCE1, STK11, SUFU, TMEM127, TP53, TSC1, TSC2, VHL, and WT1 (sequencing and deletion/duplication); EGFR, HOXB13, KIT, MITF, PDGFRA, POLD1, and POLE (sequencing only); EPCAM and GREM1 (deletion/duplication only).     09/15/2024 -  Chemotherapy   Patient is  on Treatment Plan : BREAST Pembrolizumab (200) D1 + Carboplatin (1.5) D1,8,15 + Paclitaxel (80) D1,8,15 q21d X 4 cycles / Pembrolizumab (200) D1 + AC D1 q21d x 4 cycles       FAMILY HISTORY:  We obtained a detailed, 4-generation family history.  Significant diagnoses are listed below: Family History  Problem Relation Age of Onset   Heart disease Mother    Non-Hodgkin's lymphoma Mother    Stroke Mother    Hypertension Mother    Deep vein thrombosis Mother    Diabetes Father    Heart disease Father    Dementia Father    Stroke Father    Melanoma Father        mets to lymph nodes   Hypertension Father    Pancreatic cancer Paternal Uncle    Stomach cancer Paternal Uncle        mets from pancreas   Pneumonia Maternal Grandmother    Other Maternal Grandfather        esophageal problems   Stroke Paternal Grandmother    Colon cancer Neg Hx    Esophageal cancer Neg Hx       GENETIC TEST RESULTS: Genetic testing reported out on 09/05/2024 through the [facility] CancerNext-Expanded + RNAinsight gene panel which includes sequencing, rearrangement, and RNA analysis for the following 77 genes: AIP, ALK, APC, ATM, AXIN2, BAP1, BARD1, BMPR1A, BRCA1, BRCA2, BRIP1, CDC73, CDH1, CDK4, CDKN1B, CDKN2A, CEBPA, CHEK2, CTNNA1, DDX41, DICER1, ETV6, FH, FLCN, GATA2, LZTR1, MAX, MBD4, MEN1, MET, MLH1, MSH2, MSH3, MSH6, MUTYH, NF1, NF2, NTHL1, PALB2, PHOX2B, PMS2, POT1, PRKAR1A, PTCH1, PTEN, RAD51C, RAD51D, RB1, RET, RPS20, RUNX1, SDHA, SDHAF2, SDHB, SDHC, SDHD, SMAD4, SMARCA4, SMARCB1, SMARCE1, STK11, SUFU, TMEM127, TP53, TSC1, TSC2, VHL, and WT1 (sequencing and deletion/duplication); EGFR, HOXB13, KIT, MITF,  PDGFRA, POLD1, and POLE (sequencing only); EPCAM and GREM1 (deletion/duplication only).    This cancer panel found no pathogenic mutations in any of the genes listed above. The test report has been scanned into EPIC and is located under the Molecular Pathology section of the Results Review tab.  A portion of  the result report is included below for reference.     We discussed with Ms. Kings that because current genetic testing is not perfect, it is possible there may be a gene mutation in one of these genes that current testing cannot detect, but that chance is small.  We also discussed, that there could be another gene that has not yet been discovered, or that we have not yet tested, that is responsible for the cancer diagnoses in the family. It is also possible there is a hereditary cause for the cancer in the family that Ms. Gingerich did not inherit and therefore was not identified in her testing.  Therefore, it is important to remain in touch with cancer genetics in the future so that we can continue to offer Ms. Melgarejo the most up to date genetic testing.   ADDITIONAL GENETIC TESTING: We discussed with Ms. Poth that her genetic testing was fairly extensive.  If there are genes identified to increase cancer risk that can be analyzed in the future, we would be happy to discuss and coordinate this testing at that time.    CANCER SCREENING RECOMMENDATIONS: Ms. Top test result is considered negative (normal).  This means that we have not identified a hereditary cause for her personal history of breast cancer at this time. Most cancers happen by chance and this negative test suggests that her personal history of breast cancer may fall into this category.    Possible reasons for Ms. Bong's negative genetic test include:  1. There may be a gene mutation in one of these genes that current testing methods cannot detect but that chance is small.  2. There could be another gene that has not yet been discovered, or that we have not yet tested, that is responsible for the cancer diagnoses in the family.  3.  There may be no hereditary risk for cancer in the family. The cancers in Ms. Lau and/or her family may be sporadic/familial or due to other genetic and environmental factors. 4. It is also possible  there is a hereditary cause for the cancer in the family that Ms. Ashenfelter did not inherit.  Therefore, it is recommended she continue to follow the cancer management and screening guidelines provided by her oncology and primary healthcare provider. An individual's cancer risk and medical management are not determined by genetic test results alone. Overall cancer risk assessment incorporates additional factors, including personal medical history, family history, and any available genetic information that may result in a personalized plan for cancer prevention and surveillance  An individual's cancer risk and medical management are not determined by genetic test results alone. Overall cancer risk assessment incorporates additional factors, including personal medical history, family history, and any available genetic information that may result in a personalized plan for cancer prevention and surveillance.  RECOMMENDATIONS FOR FAMILY MEMBERS:   Since she did not inherit a identifiable mutation in a cancer predisposition gene included on this panel, her children could not have inherited a known mutation from her in one of these genes. Individuals in this family might be at some increased risk of developing cancer, over the general population risk, simply due to the family history  of cancer.  We recommended women in this family have a yearly mammogram beginning at age 62, or 79 years younger than the earliest onset of cancer, an annual clinical breast exam, and perform monthly breast self-exams. Women in this family should also have a gynecological exam as recommended by their primary provider. All family members should be referred for colonoscopy starting at age 62, or 40 years younger than the earliest onset of cancer.  FOLLOW-UP: Lastly, we discussed with Ms. Marolf that cancer genetics is a rapidly advancing field and it is possible that new genetic tests will be appropriate for her and/or her family members  in the future. We encouraged her to remain in contact with cancer genetics on an annual basis so we can update her personal and family histories and let her know of advances in cancer genetics that may benefit this family.   Our contact number was provided. Ms. Scarbrough questions were answered to her satisfaction, and she knows she is welcome to call us  at anytime with additional questions or concerns.   Warren Ahle, MS, [facility] Cancer Genetic Counselor [email or web address] [phone]

## 2024-09-07 NOTE — Telephone Encounter (Signed)
 I contacted Ms. Spindler to discuss her genetic testing results. The test that was ordered was the [facility] Cancer[facility]-Expanded + RNAinsight gene panel which includes sequencing, rearrangement, and RNA analysis for the following 77 genes: AIP, ALK, APC, ATM, AXIN2, BAP1, BARD1, BMPR1A, BRCA1, BRCA2, BRIP1, CDC73, CDH1, CDK4, CDKN1B, CDKN2A, CEBPA, CHEK2, CTNNA1, DDX41, DICER1, ETV6, FH, FLCN, GATA2, LZTR1, MAX, MBD4, MEN1, MET, MLH1, MSH2, MSH3, MSH6, MUTYH, NF1, NF2, NTHL1, PALB2, PHOX2B, PMS2, POT1, PRKAR1A, PTCH1, PTEN, RAD51C, RAD51D, RB1, RET, RPS20, RUNX1, SDHA, SDHAF2, SDHB, SDHC, SDHD, SMAD4, SMARCA4, SMARCB1, SMARCE1, STK11, SUFU, TMEM127, TP53, TSC1, TSC2, VHL, and WT1 (sequencing and deletion/duplication); EGFR, HOXB13, KIT, MITF, PDGFRA, POLD1, and POLE (sequencing only); EPCAM and GREM1 (deletion/duplication only). This was ordered on 08/26/2024. No pathogenic variants were identified in the 77 genes analyzed. Detailed clinic note to follow.   The test report has been scanned into EPIC and is located under the Molecular Pathology section of the Results Review tab.  A portion of the result report is included below for reference.      Warren Ahle, MS, [facility] Cancer Genetic Counselor [email or web address] [phone]

## 2024-09-09 NOTE — Pre-Procedure Instructions (Signed)
 Surgical Instructions   Your procedure is scheduled on September 14, 2024. Report to [facility] Main Entrance A at 1:00 P.M., then check in with the Admitting office. Any questions or running late day of surgery: call [phone]  Questions prior to your surgery date: call [phone], Monday-Friday, 8am-4pm. If you experience any cold or flu symptoms such as cough, fever, chills, shortness of breath, etc. between now and your scheduled surgery, please notify us  at the above number.     Remember:  Do not eat after midnight the night before your surgery   You may drink clear liquids until 12:00 PM the afternoon of your surgery.   Clear liquids allowed are: Water, Non-Citrus Juices (without pulp), Carbonated Beverages, Clear Tea (no milk, honey, etc.), Black Coffee Only (NO MILK, CREAM OR POWDERED CREAMER of any kind), and Gatorade.    Take these medicines the morning of surgery with A SIP OF WATER: Azelastine-Fluticasone nasal spray cetirizine (ZYRTEC) linaclotide  (LINZESS )   metaxalone (SKELAXIN)  pantoprazole  (PROTONIX )    May take these medicines IF NEEDED: acetaminophen  (TYLENOL )  hyoscyamine  (LEVSIN  SL)    Follow your surgeon's instructions on when to stop Aspirin.  If no instructions were given by your surgeon then you will need to call the office to get those instructions.     One week prior to surgery, STOP taking any Aleve, Naproxen, Ibuprofen, Motrin, Advil, Goody's, BC's, all herbal medications, Omega-3 Fatty Acids (FISH OIL), and non-prescription vitamins. This includes your medication: meloxicam (MOBIC)                      Do NOT Smoke (Tobacco/Vaping) for 24 hours prior to your procedure.  If you use a CPAP at night, you may bring your mask/headgear for your overnight stay.   You will be asked to remove any contacts, glasses, piercing's, hearing aid's, dentures/partials prior to surgery. Please bring cases for these items if needed.    Patients discharged  the day of surgery will not be allowed to drive home, and someone needs to stay with them for 24 hours.  SURGICAL WAITING ROOM VISITATION Patients may have no more than 2 support people in the waiting area - these visitors may rotate.   Pre-op nurse will coordinate an appropriate time for 1 ADULT support person, who may not rotate, to accompany patient in pre-op.  Children under the age of 53 must have an adult with them who is not the patient and must remain in the main waiting area with an adult.  If the patient needs to stay at the hospital during part of their recovery, the visitor guidelines for inpatient rooms apply.  Please refer to the [facility] website for the visitor guidelines for any additional information.   If you received a COVID test during your pre-op visit  it is requested that you wear a mask when out in public, stay away from anyone that may not be feeling well and notify your surgeon if you develop symptoms. If you have been in contact with anyone that has tested positive in the last 10 days please notify you surgeon.      Pre-operative CHG Bathing Instructions   You can play a key role in reducing the risk of infection after surgery. Your skin needs to be as free of germs as possible. You can reduce the number of germs on your skin by washing with CHG (chlorhexidine gluconate) soap before surgery. CHG is an antiseptic soap that kills germs and  continues to kill germs even after washing.   DO NOT use if you have an allergy to chlorhexidine/CHG or antibacterial soaps. If your skin becomes reddened or irritated, stop using the CHG and notify one of our RNs at [phone].              TAKE A SHOWER THE NIGHT BEFORE SURGERY   Please keep in mind the following:  DO NOT shave, including legs and underarms, 48 hours prior to surgery.   You may shave your face before/day of surgery.  Place clean sheets on your bed the night before surgery Use a clean washcloth (not used  since being washed) for shower. DO NOT sleep with pet's night before surgery.  CHG Shower Instructions:  Wash your face and private area with normal soap. If you choose to wash your hair, wash first with your normal shampoo.  After you use shampoo/soap, rinse your hair and body thoroughly to remove shampoo/soap residue.  Turn the water OFF and apply half the bottle of CHG soap to a CLEAN washcloth.  Apply CHG soap ONLY FROM YOUR NECK DOWN TO YOUR TOES (washing for 3-5 minutes)  DO NOT use CHG soap on face, private areas, open wounds, or sores.  Pay special attention to the area where your surgery is being performed.  If you are having back surgery, having someone wash your back for you may be helpful. Wait 2 minutes after CHG soap is applied, then you may rinse off the CHG soap.  Pat dry with a clean towel  Put on clean pajamas    Additional instructions for the day of surgery: If you choose, you may shower the morning of surgery with an antibacterial soap.  DO NOT APPLY any lotions, deodorants, cologne, or perfumes.   Do not wear jewelry or makeup Do not wear nail polish, gel polish, artificial nails, or any other type of covering on natural nails (fingers and toes) Do not bring valuables to the hospital. [facility] is not responsible for valuables/personal belongings. Put on clean/comfortable clothes.  Please brush your teeth.  Ask your nurse before applying any prescription medications to the skin.

## 2024-09-10 ENCOUNTER — Other Ambulatory Visit: Payer: Self-pay

## 2024-09-10 ENCOUNTER — Inpatient Hospital Stay (HOSPITAL_COMMUNITY): Admission: RE | Admit: 2024-09-10 | Discharge: 2024-09-10 | Attending: General Surgery

## 2024-09-10 ENCOUNTER — Encounter (HOSPITAL_COMMUNITY): Payer: Self-pay

## 2024-09-10 HISTORY — DX: Nausea with vomiting, unspecified: R11.2

## 2024-09-10 HISTORY — DX: Other specified postprocedural states: R11.2

## 2024-09-10 NOTE — Progress Notes (Addendum)
 PCP -Candace White,MD  Cardiologist - saw Alicia Bentley 02/16/2016 for episode of chest pain. Stress test completed. Results normal. Follow up PRN  PPM/ICD - denies Device Orders -  Rep Notified -   Chest x-ray - na EKG - 09/10/24 Stress Test - 02/16/16 ECHO - 09/04/24 Cardiac Cath - denies  Sleep Study - moderate sleep apnea prior to weight loss CPAP - no  Fasting Blood Sugar - na Checks Blood Sugar _____ times a day  Last dose of GLP1 agonist-  pt has been holding Zepbound since 08/15/24.  GLP1 instructions: continue to hold Zepbound prior to surgery.  Blood Thinner Instructions:na Aspirin Instructions:Patient states she will call Dr. Yvonne office for instructions regarding holding aspirin.   ERAS Protcol -clear liquids until 12pm PRE-SURGERY Ensure or G2- no  COVID TEST- na   Anesthesia review: yes- at visit pt stated that a few weeks prior she had sternal/epigastric pain which radiated to her right side around to her back . She did not seek medical attention at the time but she was concerned and said she would go to the emergency room if it happened again. I told her she definitely should go immediately if it happened again. She denied having the pain at present and was not diaphoretic or short of breath or in any visible distress, but did describe a feeling of fullness in her chest. EKG obtained, anesthesia PA,Allison Zelenak,PA-C notified. VSS. [name] to PAT to see patient.  Patient denies shortness of breath, fever, cough and chest pain at PAT appointment   All instructions explained to the patient, with a verbal understanding of the material. Patient agrees to go over the instructions while at home for a better understanding. The opportunity to ask questions was provided.

## 2024-09-10 NOTE — Anesthesia Preprocedure Evaluation (Signed)
 Anesthesia Evaluation  Patient identified by MRN, date of birth, ID band Patient awake    Reviewed: Allergy & Precautions, NPO status , Patient's Chart, lab work & pertinent test results  History of Anesthesia Complications (+) PONV and history of anesthetic complications  Airway Mallampati: II  TM Distance: >3 FB Neck ROM: Full    Dental no notable dental hx. (+) Teeth Intact   Pulmonary neg pulmonary ROS, neg sleep apnea, neg COPD, Patient abstained from smoking.Not current smoker, former smoker   Pulmonary exam normal breath sounds clear to auscultation       Cardiovascular Exercise Tolerance: Good METShypertension, Pt. on medications (-) CAD and (-) Past MI (-) dysrhythmias  Rhythm:Regular Rate:Normal - Systolic murmurs    Neuro/Psych  Headaches PSYCHIATRIC DISORDERS Anxiety        GI/Hepatic hiatal hernia,GERD  Medicated and Poorly Controlled,,(+)     (-) substance abuse    Endo/Other  neg diabetes  Class 3 obesityGLP1 last taken a month ago. She does have some reflux symptoms today.  Renal/GU negative Renal ROS     Musculoskeletal   Abdominal  (+) + obese  Peds  Hematology   Anesthesia Other Findings Past Medical History: No date: Cystitis, interstitial No date: Diverticulosis of colon (without mention of hemorrhage) No date: DJD (degenerative joint disease) No date: Family history of brain tumor No date: Family history of breast cancer No date: Family history of colon cancer No date: Family history of liver cancer No date: Family history of melanoma No date: Family history of non-Hodgkin lymphoma No date: Fatty liver No date: GERD (gastroesophageal reflux disease) No date: Headache(784.0) No date: Hiatal hernia No date: Internal hemorrhoids No date: Irritable bowel syndrome No date: Miscarriage No date: Panic disorder No date: PONV (postoperative nausea and vomiting) 2010: Tubular adenoma of  colon No date: Unspecified essential hypertension No date: Unspecified urinary incontinence  Reproductive/Obstetrics                              Anesthesia Physical Anesthesia Plan  ASA: 3  Anesthesia Plan: General   Post-op Pain Management: Tylenol  PO (pre-op)* and Gabapentin  PO (pre-op)*   Induction: Intravenous and Rapid sequence  PONV Risk Score and Plan: 4 or greater and Ondansetron , Dexamethasone  and Midazolam   Airway Management Planned: Oral ETT  Additional Equipment: None  Intra-op Plan:   Post-operative Plan: Extubation in OR  Informed Consent: I have reviewed the patients History and Physical, chart, labs and discussed the procedure including the risks, benefits and alternatives for the proposed anesthesia with the patient or authorized representative who has indicated his/her understanding and acceptance.     Dental advisory given  Plan Discussed with: CRNA and Surgeon  Anesthesia Plan Comments: (Last had liquids at 1200, will wait until 1400 for NPO status. Has some rawness in throat today she attributes to reflux. She did take one of her refluex meds today. Discussed risks of anesthesia with patient, including PONV, sore throat, lip/dental/eye damage, aspiration. Rare risks discussed as well, such as cardiorespiratory and neurological sequelae, and allergic reactions. Discussed the role of CRNA in patient's perioperative care. Patient understands. Patient informed about increased incidence of above perioperative risk due to high BMI. Patient understands.  )         Anesthesia Quick Evaluation

## 2024-09-10 NOTE — Pre-Procedure Instructions (Signed)
 Surgical Instructions   Your procedure is scheduled on September 14, 2024. Report to [facility] Main Entrance A at 1:00 P.M., then check in with the Admitting office. Any questions or running late day of surgery: call [phone]  Questions prior to your surgery date: call [phone], Monday-Friday, 8am-4pm. If you experience any cold or flu symptoms such as cough, fever, chills, shortness of breath, etc. between now and your scheduled surgery, please notify us  at the above number.     Remember:  Do not eat after midnight the night before your surgery   You may drink clear liquids until 12:00 PM the afternoon of your surgery.   Clear liquids allowed are: Water, Non-Citrus Juices (without pulp), Carbonated Beverages, Clear Tea (no milk, honey, etc.), Black Coffee Only (NO MILK, CREAM OR POWDERED CREAMER of any kind), and Gatorade.    Take these medicines the morning of surgery with A SIP OF WATER: Azelastine-Fluticasone nasal spray cetirizine (ZYRTEC) linaclotide  (LINZESS )   metaxalone (SKELAXIN)  pantoprazole  (PROTONIX )  buPROPion (WELLBUTRIN XL)   May take these medicines IF NEEDED: acetaminophen  (TYLENOL )  hyoscyamine  (LEVSIN  SL)    Follow your surgeon's instructions on when to stop Aspirin.  If no instructions were given by your surgeon then you will need to call the office to get those instructions.    DO NOT TAKE metFORMIN (GLUCOPHAGE) THE MORNING OF SURGERY.  One week prior to surgery, STOP taking any Aleve, Naproxen, Ibuprofen, Motrin, Advil, Goody's, BC's, all herbal medications, Omega-3 Fatty Acids (FISH OIL), and non-prescription vitamins. This includes your medication: meloxicam (MOBIC)                      Do NOT Smoke (Tobacco/Vaping) for 24 hours prior to your procedure.  If you use a CPAP at night, you may bring your mask/headgear for your overnight stay.   You will be asked to remove any contacts, glasses, piercing's, hearing aid's, dentures/partials  prior to surgery. Please bring cases for these items if needed.    Patients discharged the day of surgery will not be allowed to drive home, and someone needs to stay with them for 24 hours.  SURGICAL WAITING ROOM VISITATION Patients may have no more than 2 support people in the waiting area - these visitors may rotate.   Pre-op nurse will coordinate an appropriate time for 1 ADULT support person, who may not rotate, to accompany patient in pre-op.  Children under the age of 61 must have an adult with them who is not the patient and must remain in the main waiting area with an adult.  If the patient needs to stay at the hospital during part of their recovery, the visitor guidelines for inpatient rooms apply.  Please refer to the [facility] website for the visitor guidelines for any additional information.   If you received a COVID test during your pre-op visit  it is requested that you wear a mask when out in public, stay away from anyone that may not be feeling well and notify your surgeon if you develop symptoms. If you have been in contact with anyone that has tested positive in the last 10 days please notify you surgeon.      Pre-operative CHG Bathing Instructions   You can play a key role in reducing the risk of infection after surgery. Your skin needs to be as free of germs as possible. You can reduce the number of germs on your skin by washing with CHG (chlorhexidine gluconate)  soap before surgery. CHG is an antiseptic soap that kills germs and continues to kill germs even after washing.   DO NOT use if you have an allergy to chlorhexidine/CHG or antibacterial soaps. If your skin becomes reddened or irritated, stop using the CHG and notify one of our RNs at [phone].              TAKE A SHOWER THE NIGHT BEFORE SURGERY   Please keep in mind the following:  DO NOT shave, including legs and underarms, 48 hours prior to surgery.   You may shave your face before/day of surgery.   Place clean sheets on your bed the night before surgery Use a clean washcloth (not used since being washed) for shower. DO NOT sleep with pet's night before surgery.  CHG Shower Instructions:  Wash your face and private area with normal soap. If you choose to wash your hair, wash first with your normal shampoo.  After you use shampoo/soap, rinse your hair and body thoroughly to remove shampoo/soap residue.  Turn the water OFF and apply half the bottle of CHG soap to a CLEAN washcloth.  Apply CHG soap ONLY FROM YOUR NECK DOWN TO YOUR TOES (washing for 3-5 minutes)  DO NOT use CHG soap on face, private areas, open wounds, or sores.  Pay special attention to the area where your surgery is being performed.  If you are having back surgery, having someone wash your back for you may be helpful. Wait 2 minutes after CHG soap is applied, then you may rinse off the CHG soap.  Pat dry with a clean towel  Put on clean pajamas    Additional instructions for the day of surgery: If you choose, you may shower the morning of surgery with an antibacterial soap.  DO NOT APPLY any lotions, deodorants, cologne, or perfumes.   Do not wear jewelry or makeup Do not wear nail polish, gel polish, artificial nails, or any other type of covering on natural nails (fingers and toes) Do not bring valuables to the hospital. [facility] is not responsible for valuables/personal belongings. Put on clean/comfortable clothes.  Please brush your teeth.  Ask your nurse before applying any prescription medications to the skin.

## 2024-09-10 NOTE — Progress Notes (Signed)
 Anesthesia APP PAT Evaluation:  Case: [id number] Date/Time: 09/14/24 1451   Procedure: INSERTION, TUNNELED CENTRAL VENOUS DEVICE, WITH PORT - PORT PLACEMENT WITH ULTRASOUND GUIDANCE   Anesthesia type: General   Pre-op diagnosis: RIGHT BREAST CANCER   Location: [facility] ROOM 02 / [facility]   Surgeons: Curvin Deward MOULD, MD       DISCUSSION: Patient is a 58 year old female scheduled for the above procedure. She was diagnosed with right breast cancer with axillary lymph node involvement last month. Plan for Port placement to start chemotherapy. She will be considered for potential lumpectomy post treatment and then radiation therapy. Oncology may consider maintenance immunotherapy post-surgery followed by anti estrogen therapy.   History includes former smoker (only social smoking in college), postoperative N/V, HTN, GERD (s/p Nissen fundoplication 1998), hiatal hernia, IBS, fatty liver disease, panic disorder, interstitial cystitis, IBS, diverticulosis, breast cancer (right breast biopsy +IDC 08/17/2024), hysterectomy, sinus surgery (10/31/2000).  BMI is consistent with morbid obesity, but with about 90 lb weight loss over the past year with initial weight loss of over 40 lbs with lifestyle modification, and then additional weight loss on GLP-1 Agonist via [facility].   I evaluated her during her 09/10/2024 PAT visit due to history of chest pain episode on 08/21/2024. Her husband was present during the PAT visit. They both wore face masks. I also wore a face mask. She reported holding her ASA for her 08/17/2024 right breast biopsy. On 08/21/2024 while walking into the kitchen to fix supper, she had a sudden sharp chest pain that started more in the center of her chest but radiated along her right breast/chest/flank region. She took her baby ASA. She had been a little nauseated earlier that week. She tends to have some degree of chronic diarrhea on Linzess  for IBS-C, but did not notice any changes to her  bowel habits. She bent over to help relieve the pain, and it resolved on it's own in about 15 minutes. She may have felt sweaty after the episode, but did not notice any other associated symptoms. She has not had a recurrent event like this, but says she has had 3-4 episodes of feeling fullness in her mid chest. She does not correlate fullness with activity, rather she tends to notice it when sitting in her recliner at night, although she had another episode while at her PAT visit. EKG showed NSR. She said these episodes can last for hours. She takes deep breaths to help them improve. Denied chest tenderness. She denied any associated symptoms like SOB, diaphoresis, palpitations, jaw pain. She is not overly active, but does her own house work (sweeping, vacuuming), shopping, and occasionally uses her treadmill (no treadmill since breast cancer diagnosis). She has not noticed any chest symptoms with activity. She is admittedly a product/process development scientist and wonders if she needs anxiety medication.  The visit actually began with her telling me that no one had reviewed her echo results, and she thought EF of 60-65% was abnormal and something was wrong with her heart.  She is scheduled for her yearly visit with her PCP on 09/21/2024 and was concerned she needed to start cholesterol medication. She says she has been more emotional since her breast cancer diagnosis and has been overwhelmed at all her appointments. She just wants to be okay.  Her 82 year old mother does have a history of silent heart attack after the age of 5 and now has a blockage behind her heart that doctors are managing medically. Her father  had a history of multiple strokes. She is not a diabetic (pre-diabetic by A1c 06/2023) and has very little smoking history (only social smoking in college).   We reviewed her 09/10/2024 EKG that showed NSR and her 09/04/2024 (pre-chemo) echo that was unremarkable showing normal LVEF 60-65%, no RWMA, normal diastolic  parameters, normal RV systolic function, normal PASP, estimated RVSP 24 mmHg, mildly dilated LA, trivial MR. Her chest fullness has not been associated with exertion and without really any associated symptoms. These are all reassuring, but we did discuss limitations of our PAT clinic. I offered to take her to the ED for further evaluation with cardiac enzymes, but she declined. We discussed that should symptoms progress, become more frequent or more severe, or if she developed associated symptoms like N/V, jaw or arm pain, acute SOB, diaphoresis, syncope/near syncope, or generally feeling poorly then would advised ED evaluation. Remote history of a normal stress test in 2017. Above also discussed with anesthesiologist Boone Fess, MD. Agreed with ED precautions, otherwise no additional preoperative recommendations unless new changes in her status or symptoms.     She is followed by Dr. Albertus with GI for adenomatous colon polyps, GERD, fatty liver, IBS with diarrhea and constipation, obesity. S/p Nissen fundoplication in 1998. Last EGD/colonoscopy were in 2022. 3 small tubular adenomas were removed. EGD showed mild gastritis, intact Nissen wrap. GERD reasonably controlled on high dose PPI and H2 blocker. GLP-1 agonist likely exacerbating. Repeat 3 years recall colonoscopy planned and would also scheduled EGD as well to follow-up GERD/gastritis/esophagitis. in 3 years recommended. Plan for EGD and colonoscopy in the near future--these were scheduled but postponed given her new breast cancer diagnosis with plane to reschedule after she completes initial breat cancer treatments.   Last Zepbound was on 08/15/2024 and to hold until after surgery.   CBC and CMP at [facility] were normal.   Anesthesia team to evaluate on the day of surgery.   VS: BP 129/84   Pulse 64   Temp 36.8 C (Oral)   Resp 20   Ht 5' 4 (1.626 m)   Wt 115 kg   SpO2 98%   BMI 43.51 kg/m  Heart RRR, no murmur. Lung clear. No carotid  bruits. No ankle edema noted. A&O x4. Able to ambulate independently. No conversational dyspnea, no diaphoresis. No acute distress.    PROVIDERS: [name], MD is PCP  Loretha Ash, MD is HEM-ONC Albertus Heinz, MD is GI Halina Picking, MD is pulmonologist - She is not currently followed by cardiology but was evaluated by Dr. Maude Emmer on 02/14/2016 for chest pain and feeling like she couldn't take a deep breath. Symptoms started after her 74 year old father passes away. She was experiencing some anxiety and depression and had been started on anxiolytic and antidepressant therapy. Grief reaction felt contributing but with fairly sedentary lifestyle, a stress test was ordered which was normal.    LABS: Most recent lab results in [facility] include: Lab Results  Component Value Date   WBC 6.4 08/26/2024   HGB 13.3 08/26/2024   HCT 39.8 08/26/2024   PLT 324 08/26/2024   GLUCOSE 92 08/26/2024   ALT <5 08/26/2024   AST 16 08/26/2024   NA 137 08/26/2024   K 3.9 08/26/2024   CL 99 08/26/2024   CREATININE 0.93 08/26/2024   BUN 18 08/26/2024   CO2 28 08/26/2024   TSH 2.12 04/14/2013   A1c 6.0% 06/11/2023.   IMAGES: PET Scan 08/28/2024: IMPRESSION: 1. Hypermetabolic right breast  mass consistent with known breast cancer. 2. Hypermetabolic right axillary and right internal mammary lymph nodes consistent with nodal metastases. 3. No evidence of distant metastatic disease. 4.  Aortic Atherosclerosis (ICD10-I70.0).   EKG: 09/10/2024:  Normal sinus rhythm at 61 bpm  Normal ECG No previous ECGs available Confirmed by Nancey Scotts [phone]) on 09/10/2024 4:32:04 P   CV: Echo 09/04/2024: IMPRESSIONS   1. Left ventricular ejection fraction, by estimation, is 60 to 65%. The  left ventricle has normal function. The left ventricle has no regional  wall motion abnormalities. Left ventricular diastolic parameters were  normal. The average left ventricular  global longitudinal strain is  -18.9 %. The global longitudinal strain is  normal.   2. Right ventricular systolic function is normal. The right ventricular  size is normal. There is normal pulmonary artery systolic pressure. The  estimated right ventricular systolic pressure is 24.0 mmHg.   3. Left atrial size was mildly dilated.   4. The mitral valve is normal in structure. Trivial mitral valve  regurgitation. No evidence of mitral stenosis.   5. The aortic valve is tricuspid. Aortic valve regurgitation is not  visualized. No aortic stenosis is present.   6. The inferior vena cava is dilated in size with >50% respiratory  variability, suggesting right atrial pressure of 8 mmHg.    Nuclear stress test 03/01/2016: Nuclear stress EF: 58%. Normal LV function There was no ST segment deviation noted during stress. The study is normal. No evidence of ischemia This is a low risk study.    Past Medical History:  Diagnosis Date   Cystitis, interstitial    Diverticulosis of colon (without mention of hemorrhage)    DJD (degenerative joint disease)    Family history of brain tumor    Family history of breast cancer    Family history of colon cancer    Family history of liver cancer    Family history of melanoma    Family history of non-Hodgkin lymphoma    Fatty liver    GERD (gastroesophageal reflux disease)    Headache(784.0)    Hiatal hernia    Internal hemorrhoids    Irritable bowel syndrome    Miscarriage    Panic disorder    PONV (postoperative nausea and vomiting)    Tubular adenoma of colon 2010   Unspecified essential hypertension    Unspecified urinary incontinence     Past Surgical History:  Procedure Laterality Date   ABDOMINAL HYSTERECTOMY     BREAST BIOPSY Right 08/17/2024   US  RT BREAST BX W LOC DEV 1ST LESION IMG BX SPEC US  GUIDE 08/17/2024 [facility]   COLONOSCOPY N/A 12/29/2012   Procedure: COLONOSCOPY;  Surgeon: Alm JONELLE Gander, MD;  Location: [facility];  Service: Endoscopy;   Laterality: N/A;   deviated septrum     NASAL SINUS SURGERY     NISSEN FUNDOPLICATION     SKIN GRAFT     right ear   TYMPANOSTOMY TUBE PLACEMENT     x 10    MEDICATIONS:  bisacodyl (DULCOLAX) 5 MG EC tablet   metFORMIN (GLUCOPHAGE) 500 MG tablet   acetaminophen  (TYLENOL ) 650 MG CR tablet   aspirin 81 MG tablet   Azelastine-Fluticasone 137-50 MCG/ACT SUSP   buPROPion (WELLBUTRIN XL) 150 MG 24 hr tablet   Calcium Carb-Cholecalciferol (CALCIUM + VITAMIN D3 PO)   cetirizine (ZYRTEC) 10 MG tablet   dexamethasone  (DECADRON ) 4 MG tablet   famotidine (PEPCID) 20 MG tablet   hydrOXYzine (ATARAX) 25 MG  tablet   hyoscyamine  (LEVSIN  SL) 0.125 MG SL tablet   lidocaine -prilocaine  (EMLA ) cream   linaclotide  (LINZESS ) 290 MCG CAPS capsule   losartan (COZAAR) 50 MG tablet   meloxicam (MOBIC) 15 MG tablet   metaxalone (SKELAXIN) 800 MG tablet   Multiple Vitamins-Minerals (ONE-A-DAY WOMENS 50 PLUS PO)   Omega-3 Fatty Acids (FISH OIL) 1200 MG CAPS   ondansetron  (ZOFRAN ) 8 MG tablet   pantoprazole  (PROTONIX ) 40 MG tablet   polyethylene glycol powder (GLYCOLAX/MIRALAX) 17 GM/SCOOP powder   PROBIOTIC PRODUCT PO   prochlorperazine  (COMPAZINE ) 10 MG tablet   Sodium Sulfate-Mag Sulfate-KCl (SUTAB ) [phone] MG TABS   triamterene-hydrochlorothiazide (MAXZIDE) 75-50 MG per tablet   ZEPBOUND 12.5 MG/0.5ML Pen   No current facility-administered medications for this encounter.     [name] Ruder, PA-C Surgical Short Stay/Anesthesiology [facility] Phone [phone] [facility] Phone [phone] 09/10/2024 5:28 PM

## 2024-09-11 NOTE — Progress Notes (Signed)
 Pharmacist Chemotherapy Monitoring - Initial Assessment    Anticipated start date: 09/17/24    The following has been reviewed per standard work regarding the patient's treatment regimen: The patient's diagnosis, treatment plan and drug doses, and organ/hematologic function Lab orders and baseline tests specific to treatment regimen  The treatment plan start date, drug sequencing, and pre-medications Prior authorization status  Patient's documented medication list, including drug-drug interaction screen and prescriptions for anti-emetics and supportive care specific to the treatment regimen The drug concentrations, fluid compatibility, administration routes, and timing of the medications to be used The patient's access for treatment and lifetime cumulative dose history, if applicable  The patient's medication allergies and previous infusion related reactions, if applicable   Changes made to treatment plan:  treatment plan date  Follow up needed:  Pending authorization for treatment    Bridgett Leotis Helling, RPH, BCPS, BCOP 09/11/2024   10:19 AM

## 2024-09-14 ENCOUNTER — Ambulatory Visit (HOSPITAL_COMMUNITY)

## 2024-09-14 ENCOUNTER — Ambulatory Visit (HOSPITAL_COMMUNITY): Admitting: Anesthesiology

## 2024-09-14 ENCOUNTER — Ambulatory Visit (HOSPITAL_COMMUNITY)
Admission: RE | Admit: 2024-09-14 | Discharge: 2024-09-14 | Disposition: A | Attending: General Surgery | Admitting: General Surgery

## 2024-09-14 ENCOUNTER — Ambulatory Visit (HOSPITAL_COMMUNITY): Payer: Self-pay | Admitting: Vascular Surgery

## 2024-09-14 ENCOUNTER — Encounter: Admission: RE | Disposition: A | Payer: Self-pay | Attending: General Surgery

## 2024-09-14 HISTORY — PX: PORTACATH PLACEMENT: SHX2246

## 2024-09-14 SURGERY — INSERTION, TUNNELED CENTRAL VENOUS DEVICE, WITH PORT
Anesthesia: General | Site: Chest

## 2024-09-14 MED ORDER — DEXAMETHASONE SOD PHOSPHATE PF 10 MG/ML IJ SOLN
INTRAMUSCULAR | Status: DC | PRN
  Administered 2024-09-14: 8 mg via INTRAVENOUS

## 2024-09-14 MED ORDER — FENTANYL CITRATE (PF) 100 MCG/2ML IJ SOLN
INTRAMUSCULAR | Status: AC
  Filled 2024-09-14: qty 2

## 2024-09-14 MED ORDER — 0.9 % SODIUM CHLORIDE (POUR BTL) OPTIME
TOPICAL | Status: DC | PRN
  Administered 2024-09-14: 1000 mL

## 2024-09-14 MED ORDER — DIPHENHYDRAMINE HCL 25 MG PO CAPS
ORAL_CAPSULE | ORAL | Status: AC
  Filled 2024-09-14: qty 1

## 2024-09-14 MED ORDER — OXYCODONE HCL 5 MG/5ML PO SOLN: 5.0000 mg | Freq: Once | ORAL | Status: AC | PRN

## 2024-09-14 MED ORDER — ACETAMINOPHEN 500 MG PO TABS
ORAL_TABLET | ORAL | Status: AC
  Administered 2024-09-14: 1000 mg via ORAL
  Filled 2024-09-14: qty 2

## 2024-09-14 MED ORDER — OXYCODONE HCL 5 MG PO TABS
ORAL_TABLET | ORAL | Status: AC
  Filled 2024-09-14: qty 1

## 2024-09-14 MED ORDER — OXYCODONE HCL 5 MG PO TABS: 5.0000 mg | ORAL_TABLET | Freq: Four times a day (QID) | ORAL | 0 refills | Status: AC | PRN

## 2024-09-14 MED ORDER — DIPHENHYDRAMINE HCL 25 MG PO CAPS
25.0000 mg | ORAL_CAPSULE | Freq: Once | ORAL | Status: AC
  Administered 2024-09-14: 25 mg via ORAL

## 2024-09-14 MED ORDER — OXYCODONE HCL 5 MG PO TABS
5.0000 mg | ORAL_TABLET | Freq: Once | ORAL | Status: AC | PRN
  Administered 2024-09-14: 5 mg via ORAL

## 2024-09-14 MED ORDER — HEPARIN SOD (PORK) LOCK FLUSH 100 UNIT/ML IV SOLN
INTRAVENOUS | Status: DC | PRN
  Administered 2024-09-14: 500 [IU]

## 2024-09-14 MED ORDER — FENTANYL CITRATE (PF) 100 MCG/2ML IJ SOLN
INTRAMUSCULAR | Status: DC | PRN
  Administered 2024-09-14: 100 ug via INTRAVENOUS

## 2024-09-14 MED ORDER — FENTANYL CITRATE (PF) 100 MCG/2ML IJ SOLN
25.0000 ug | INTRAMUSCULAR | Status: DC | PRN
  Administered 2024-09-14 (×3): 50 ug via INTRAVENOUS

## 2024-09-14 MED ORDER — BUPIVACAINE-EPINEPHRINE (PF) 0.25% -1:200000 IJ SOLN
INTRAMUSCULAR | Status: DC | PRN
  Administered 2024-09-14: 9 mL

## 2024-09-14 MED ORDER — HYDROMORPHONE HCL 1 MG/ML IJ SOLN
INTRAMUSCULAR | Status: DC | PRN
  Administered 2024-09-14: .5 mg via INTRAVENOUS

## 2024-09-14 MED ORDER — ONDANSETRON HCL 4 MG/2ML IJ SOLN
INTRAMUSCULAR | Status: AC
  Filled 2024-09-14: qty 2

## 2024-09-14 MED ORDER — ACETAMINOPHEN 500 MG PO TABS: 1000.0000 mg | ORAL_TABLET | ORAL | Status: AC

## 2024-09-14 MED ORDER — ACETAMINOPHEN 10 MG/ML IV SOLN: 1000.0000 mg | Freq: Once | INTRAVENOUS | Status: DC | PRN

## 2024-09-14 MED ORDER — GABAPENTIN 100 MG PO CAPS
ORAL_CAPSULE | ORAL | Status: AC
  Administered 2024-09-14: 100 mg via ORAL
  Filled 2024-09-14: qty 1

## 2024-09-14 MED ORDER — LACTATED RINGERS IV SOLN: INTRAVENOUS | Status: DC | PRN

## 2024-09-14 MED ORDER — SUCCINYLCHOLINE CHLORIDE 200 MG/10ML IV SOSY
PREFILLED_SYRINGE | INTRAVENOUS | Status: DC | PRN
  Administered 2024-09-14: 140 mg via INTRAVENOUS

## 2024-09-14 MED ORDER — ONDANSETRON HCL 4 MG/2ML IJ SOLN
INTRAMUSCULAR | Status: DC | PRN
  Administered 2024-09-14: 4 mg via INTRAVENOUS

## 2024-09-14 MED ORDER — DROPERIDOL 2.5 MG/ML IJ SOLN: 0.6250 mg | Freq: Once | INTRAMUSCULAR | Status: DC | PRN

## 2024-09-14 MED ORDER — CHLORHEXIDINE GLUCONATE CLOTH 2 % EX PADS: 6.0000 | MEDICATED_PAD | Freq: Once | CUTANEOUS | Status: DC

## 2024-09-14 MED ORDER — CEFAZOLIN SODIUM-DEXTROSE 2-4 GM/100ML-% IV SOLN
2.0000 g | INTRAVENOUS | Status: AC
  Administered 2024-09-14: 2 g via INTRAVENOUS

## 2024-09-14 MED ORDER — HYDROMORPHONE HCL 1 MG/ML IJ SOLN
INTRAMUSCULAR | Status: AC
  Filled 2024-09-14: qty 0.5

## 2024-09-14 MED ORDER — PROPOFOL 10 MG/ML IV BOLUS
INTRAVENOUS | Status: AC
  Filled 2024-09-14: qty 20

## 2024-09-14 MED ORDER — PHENYLEPHRINE 80 MCG/ML (10ML) SYRINGE FOR IV PUSH (FOR BLOOD PRESSURE SUPPORT)
PREFILLED_SYRINGE | INTRAVENOUS | Status: DC | PRN
  Administered 2024-09-14: 80 ug via INTRAVENOUS
  Administered 2024-09-14 (×4): 160 ug via INTRAVENOUS
  Administered 2024-09-14: 80 ug via INTRAVENOUS

## 2024-09-14 MED ORDER — ROCURONIUM BROMIDE 10 MG/ML (PF) SYRINGE
PREFILLED_SYRINGE | INTRAVENOUS | Status: AC
  Filled 2024-09-14: qty 10

## 2024-09-14 MED ORDER — SUCCINYLCHOLINE CHLORIDE 200 MG/10ML IV SOSY
PREFILLED_SYRINGE | INTRAVENOUS | Status: AC
  Filled 2024-09-14: qty 10

## 2024-09-14 MED ORDER — HEPARIN 6000 UNIT IRRIGATION SOLUTION
Status: AC
  Filled 2024-09-14: qty 500

## 2024-09-14 MED ORDER — CEFAZOLIN SODIUM-DEXTROSE 2-4 GM/100ML-% IV SOLN
INTRAVENOUS | Status: AC
  Filled 2024-09-14: qty 100

## 2024-09-14 MED ORDER — ROCURONIUM BROMIDE 100 MG/10ML IV SOLN
INTRAVENOUS | Status: DC | PRN
  Administered 2024-09-14: 20 mg via INTRAVENOUS
  Administered 2024-09-14 (×2): 30 mg via INTRAVENOUS

## 2024-09-14 MED ORDER — LIDOCAINE 2% (20 MG/ML) 5 ML SYRINGE
INTRAMUSCULAR | Status: DC | PRN
  Administered 2024-09-14: 100 mg via INTRAVENOUS

## 2024-09-14 MED ORDER — GABAPENTIN 100 MG PO CAPS: 100.0000 mg | ORAL_CAPSULE | ORAL | Status: AC

## 2024-09-14 MED ORDER — LIDOCAINE HCL (CARDIAC) PF 100 MG/5ML IV SOSY: PREFILLED_SYRINGE | INTRAVENOUS | Status: DC | PRN

## 2024-09-14 MED ORDER — LIDOCAINE 2% (20 MG/ML) 5 ML SYRINGE
INTRAMUSCULAR | Status: AC
  Filled 2024-09-14: qty 5

## 2024-09-14 MED ORDER — PROPOFOL 10 MG/ML IV BOLUS
INTRAVENOUS | Status: DC | PRN
  Administered 2024-09-14: 20 mg via INTRAVENOUS
  Administered 2024-09-14: 180 mg via INTRAVENOUS
  Administered 2024-09-14: 20 mg via INTRAVENOUS

## 2024-09-14 MED ORDER — MIDAZOLAM HCL 5 MG/5ML IJ SOLN
INTRAMUSCULAR | Status: DC | PRN
  Administered 2024-09-14: 2 mg via INTRAVENOUS

## 2024-09-14 MED ORDER — HEPARIN SOD (PORK) LOCK FLUSH 100 UNIT/ML IV SOLN
INTRAVENOUS | Status: AC
  Filled 2024-09-14: qty 5

## 2024-09-14 MED ORDER — HEPARIN 6000 UNIT IRRIGATION SOLUTION
Status: DC | PRN
  Administered 2024-09-14: 1

## 2024-09-14 MED ORDER — SUGAMMADEX SODIUM 200 MG/2ML IV SOLN
INTRAVENOUS | Status: DC | PRN
  Administered 2024-09-14: 400 mg via INTRAVENOUS

## 2024-09-14 MED ORDER — PROPOFOL 500 MG/50ML IV EMUL
INTRAVENOUS | Status: DC | PRN
  Administered 2024-09-14: 160 ug/kg/min via INTRAVENOUS

## 2024-09-14 MED ORDER — MIDAZOLAM HCL 2 MG/2ML IJ SOLN
INTRAMUSCULAR | Status: AC
  Filled 2024-09-14: qty 2

## 2024-09-14 SURGICAL SUPPLY — 30 items
APPLICATOR CHLORAPREP 10.5 ORG (MISCELLANEOUS) ×2 IMPLANT
BAG COUNTER SPONGE SURGICOUNT (BAG) ×2 IMPLANT
BAG DECANTER FOR FLEXI CONT (MISCELLANEOUS) ×2 IMPLANT
COVER SURGICAL LIGHT HANDLE (MISCELLANEOUS) ×2 IMPLANT
COVER TRANSDUCER ULTRASND GEL (DISPOSABLE) IMPLANT
DERMABOND ADVANCED .7 DNX12 (GAUZE/BANDAGES/DRESSINGS) ×2 IMPLANT
DRAPE C-ARM 42X120 X-RAY (DRAPES) ×2 IMPLANT
ELECT CAUTERY BLADE 6.4 (BLADE) ×2 IMPLANT
ELECTRODE REM PT RTRN 9FT ADLT (ELECTROSURGICAL) ×2 IMPLANT
GEL ULTRASOUND 20GR AQUASONIC (MISCELLANEOUS) IMPLANT
GLOVE BIO SURGEON STRL SZ7.5 (GLOVE) ×2 IMPLANT
GOWN STRL REUS W/ TWL LRG LVL3 (GOWN DISPOSABLE) ×4 IMPLANT
KIT BASIN OR (CUSTOM PROCEDURE TRAY) ×2 IMPLANT
KIT PORT POWER 8FR ISP CVUE (Port) IMPLANT
KIT TURNOVER KIT B (KITS) ×2 IMPLANT
NDL 22X1.5 STRL (OR ONLY) (MISCELLANEOUS) IMPLANT
PAD ARMBOARD POSITIONER FOAM (MISCELLANEOUS) ×2 IMPLANT
PENCIL BUTTON HOLSTER BLD 10FT (ELECTRODE) ×2 IMPLANT
POSITIONER HEAD DONUT 9IN (MISCELLANEOUS) ×2 IMPLANT
SHEATH COOK PEEL AWAY SET 9F (SHEATH) IMPLANT
SOLN 0.9% NACL POUR BTL 1000ML (IV SOLUTION) ×2 IMPLANT
SPIKE FLUID TRANSFER (MISCELLANEOUS) ×2 IMPLANT
SUT MNCRL AB 4-0 PS2 18 (SUTURE) ×2 IMPLANT
SUT PROLENE 2 0 SH 30 (SUTURE) ×2 IMPLANT
SUT VIC AB 3-0 SH 27XBRD (SUTURE) ×2 IMPLANT
SYR 10ML LL (SYRINGE) IMPLANT
SYR 5ML LUER SLIP (SYRINGE) ×2 IMPLANT
TOWEL GREEN STERILE (TOWEL DISPOSABLE) ×2 IMPLANT
TOWEL GREEN STERILE FF (TOWEL DISPOSABLE) ×2 IMPLANT
TRAY LAPAROSCOPIC MC (CUSTOM PROCEDURE TRAY) ×2 IMPLANT

## 2024-09-14 NOTE — Op Note (Signed)
 09/14/2024  3:11 PM  PATIENT:  Alicia Bentley  58 y.o. female  PRE-OPERATIVE DIAGNOSIS:  RIGHT BREAST CANCER  POST-OPERATIVE DIAGNOSIS:  RIGHT BREAST CANCER  PROCEDURE:  Procedure(s) with comments: INSERTION, TUNNELED CENTRAL VENOUS DEVICE, WITH PORT (Left) - WITH ULTRASOUND GUIDANCE  SURGEON:  Surgeons and Role:    * Curvin Deward MOULD, MD - Primary  PHYSICIAN ASSISTANT:   ASSISTANTS: none   ANESTHESIA:   local and general  EBL:  minimal   BLOOD ADMINISTERED:none  DRAINS: none   LOCAL MEDICATIONS USED:  MARCAINE      SPECIMEN:  No Specimen  DISPOSITION OF SPECIMEN:  N/A  COUNTS:  YES  TOURNIQUET:  * No tourniquets in log *  DICTATION: .Dragon Dictation  After informed consent was obtained the patient was brought to the operating room and placed in the supine position on the operating table.  After adequate induction of general anesthesia a roll was placed between the patient's shoulder blades to extend the shoulder slightly.  The left chest and neck area were then prepped with DuraPrep, allowed to dry, and draped in usual sterile manner.  An appropriate timeout was performed.  The patient was placed in Trendelenburg position.  The ultrasound was used to identify the left internal jugular vein.  A large bore needle from the Port-A-Cath kit was used to access the left internal jugular vein under ultrasound guidance.  A wire was then fed through the needle using the Seldinger technique without difficulty.  The wire was confirmed in the central venous system using real-time fluoroscopy.  Next a small incision was made on the left chest wall with a 15 blade knife.  The incision was carried through the skin and subcutaneous tissue sharply with the electrocautery.  A subcutaneous pocket was then created inferior to the incision by blunt finger dissection.  Next a small incision was made at the wire entry site on the left neck.  A hemostat was then used to create a subcutaneous  tunnel between the 2 incisions.  A tendon passer was placed across the tunnel and used to bring the tubing across the tunnel.  The tubing was then placed on the reservoir.  The reservoir was placed in the pocket and the length of the tubing was estimated using real-time fluoroscopy.  The tubing was cut to the appropriate length.  Next a sheath and dilator were fed over the wire using the Seldinger technique without difficulty.  The dilator and wire were removed from the patient.  The tubing was fed through the sheath as far as it would go and then held in place while the sheath was gently cracked and separated.  Another real-time fluoroscopy image showed the tip of the catheter to be in the distal superior vena cava.  The tubing was then permanently anchored to the reservoir.  The reservoir was anchored in the pocket with two 2-0 Prolene stitches.  The neck incision was closed with an interrupted 4-0 Monocryl subcuticular stitch.  The subcutaneous tissue was closed over the port using interrupted 3-0 Vicryl stitches.  The skin was then closed with a running 4-0 Monocryl subcuticular stitch.  Dermabond dressings were applied.  The patient tolerated the procedure well.  At the end of the case all needle sponge and instrument counts were correct.  The patient was then awakened and taken to recovery in stable condition.  PLAN OF CARE: Discharge to home after PACU  PATIENT DISPOSITION:  PACU - hemodynamically stable.   Delay start of Pharmacological  VTE agent (>24hrs) due to surgical blood loss or risk of bleeding: not applicable

## 2024-09-14 NOTE — Interval H&P Note (Signed)
 History and Physical Interval Note:  09/14/2024 1:16 PM  Alicia Bentley  has presented today for surgery, with the diagnosis of RIGHT BREAST CANCER.  The various methods of treatment have been discussed with the patient and family. After consideration of risks, benefits and other options for treatment, the patient has consented to  Procedure(s) with comments: INSERTION, TUNNELED CENTRAL VENOUS DEVICE, WITH PORT (N/A) - PORT PLACEMENT WITH ULTRASOUND GUIDANCE as a surgical intervention.  The patient's history has been reviewed, patient examined, no change in status, stable for surgery.  I have reviewed the patient's chart and labs.  Questions were answered to the patient's satisfaction.     [name]

## 2024-09-14 NOTE — Anesthesia Postprocedure Evaluation (Signed)
 Anesthesia Post Note  Patient: Princessa Lesmeister Naef  Procedure(s) Performed: INSERTION, TUNNELED CENTRAL VENOUS DEVICE, WITH PORT (Left: Chest)     Patient location during evaluation: PACU Anesthesia Type: General Level of consciousness: awake and alert Pain management: pain level controlled Vital Signs Assessment: post-procedure vital signs reviewed and stable Respiratory status: spontaneous breathing, nonlabored ventilation, respiratory function stable and patient connected to nasal cannula oxygen Cardiovascular status: blood pressure returned to baseline and stable Postop Assessment: no apparent nausea or vomiting Anesthetic complications: no   No notable events documented.  Last Vitals:  Vitals:   09/14/24 1300 09/14/24 1513  BP: 139/86 134/72  Pulse: 65 77  Resp: 20   Temp: 36.8 C 36.5 C  SpO2: 95% 95%    Last Pain:  Vitals:   09/14/24 1531  TempSrc:   PainSc: 8                  Rome Ade

## 2024-09-14 NOTE — H&P (Signed)
 REFERRING PHYSICIAN: Loretha Linnette Lane Mamie* PROVIDER: DEWARD GARNETTE NULL, MD MRN: [id number] DOB: [birth year] Subjective   Chief Complaint: Breast Cancer  History of Present Illness: Alicia Bentley is a 58 y.o. female who is seen today as an office consultation for evaluation of Breast Cancer  We are asked to see the patient in consultation by Dr. Loretha to evaluate her for any new right breast cancer. The patient is a 58 year old white female who recently went for a routine screening mammogram. At that time she was found to have a 1.9 cm mass in the lower outer quadrant of the right breast. She also had 1 abnormal looking lymph node. 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 She does have some hypertension but does not smoke.  Review of Systems: A complete review of systems was obtained from the patient. I have reviewed this information and discussed as appropriate with the patient. See HPI as well for other ROS.  ROS   Medical History: Past Medical History:  Diagnosis Date  Anxiety  Arthritis  Asthma, unspecified asthma severity, unspecified whether complicated, unspecified whether persistent (HHS-HCC)  GERD (gastroesophageal reflux disease)  Hypertension  Liver disease   Patient Active Problem List  Diagnosis  Neuralgia, postherpetic  Irritable bowel syndrome  Hypertension  Hiatal hernia  Headache(784.0)  Esophageal reflux  Diverticulosis of colon (without mention of hemorrhage)  Degenerative joint disease  Cough  Allergy  Abdominal pain, epigastric  Body mass index (BMI) 50.0-59.9, adult (CMS-HCC)  Degeneration of lumbar intervertebral disc  Disorder of sacrum  Dizziness  Dyslipidemia  Headache  Malignant neoplasm of lower-outer quadrant of right breast of female, estrogen receptor positive (CMS/HHS-HCC)  Chest pain  Panic disorder  Pre-diabetes  Super  obesity, unspecified, unspecified  Urinary incontinence  Weakness  Constipation   Past Surgical History:  Procedure Laterality Date  COLONOSCOPY N/A 12/29/2012  Breast biopsy (Right) Right 08/17/2024  Nasal sinus surgery N/A  Date Unknown  .Skin graft N/A  Date Unknown  ABDOMINAL HYSTERECTOMY N/A  Date Unknown  deviated septrum N/A  Date Unknown  Nissen fundoplication N/A  Date Unknown  Tympanostomy tube placement N/A  Date Unknown    Allergies  Allergen Reactions  Albuterol Palpitations  REACTION: makes heart beat faster REACTION: makes heart beat faster  Codeine Phosphate Rash  Hydrocodone -Acetaminophen  Rash  Oxycodone  Rash  Tramadol  Hcl Rash  Codeine Itching  Escitalopram Oxalate Itching  Tramadol  Itching   Current Outpatient Medications on File Prior to Visit  Medication Sig Dispense Refill  buPROPion (WELLBUTRIN XL) 150 MG XL tablet Take 150 mg by mouth every morning  hyoscyamine  (LEVSIN /SL) 0.125 mg SL tablet Place 0.125 mg under the tongue every 8 (eight) hours as needed  linaCLOtide  (LINZESS ) 290 mcg capsule Take 290 mcg by mouth every morning before breakfast (0630)  metFORMIN (GLUCOPHAGE-XR) 500 MG XR tablet Take 1,000 mg by mouth once daily  acetaminophen  (TYLENOL ) 650 MG ER tablet Take 650 mg by mouth 2 (two) times daily as needed  aspirin (ECOTRIN ORAL) Take by mouth  aspirin 81 MG EC tablet Take 81 mg by mouth once daily  Bacillus coagulans (PROBIOTIC, B. COAGULANS, ORAL) as directed  Bifidobacterium infantis (ALIGN) 4 mg capsule Take 1 capsule by mouth once daily  Bifidobacterium longum (ALIGN, B.LONGUM,) 10 million cell capsule Take by mouth once daily  cetirizine (ZYRTEC) 10 MG tablet 1 tablet OTC  DOCOSAHEXAENOIC ACID ORAL Take 2 capsules by mouth once daily  ELMIRON 100 mg capsule TAKE 2 CAPSULE INTRAVESICALLY EVERY DAY AS NEEDED  famotidine (PEPCID) 20 MG tablet Take 20 mg by mouth at bedtime as needed for Heartburn  gabapentin  (NEURONTIN ) 300  MG capsule TAKE 1 CAPSULE BY MOUTH THREE TIMES A DAY 270 capsule 3  krill oil/hyaluronic/astaxanth (MEGARED JOINT CARE ORAL) Take by mouth  liraglutide, weight loss, (SAXENDA) 3 mg/0.5 mL (18 mg/3 mL) pen injector 0.6mg  once a day x 1 week, then 1.2mg  once a day x 1 week, then 1.8mg  once a day x 1 week, then 2.4mg  once a day x 2 week then 3.0mg  daily thereafter  losartan (COZAAR) 50 MG tablet Take 50 mg by mouth once daily  mecobalamin (B12 ACTIVE ORAL) Take by mouth  meloxicam (MOBIC) 15 MG tablet Take 15 mg by mouth once daily  metaxalone (SKELAXIN) 800 mg tablet Take 800 mg by mouth 2 (two) times daily as needed  pantoprazole  (PROTONIX ) 40 MG DR tablet Take 40 mg by mouth 2 (two) times daily  polyethylene glycol (MIRALAX) powder Take 17 g by mouth once daily Dissolve 1 capful (17g) in 4-8 ounces of liquid and take by mouth daily.  psyllium (METAMUCIL) 0.52 gram capsule Take 0.52 g by mouth once daily  triamterene-hydrochlorothiazide (MAXZIDE) 75-50 mg tablet Take 1 tablet by mouth every morning  ZEPBOUND 12.5 mg/0.5 mL pen injector Inject 0.5 mLs subcutaneously every 7 (seven) days   No current facility-administered medications on file prior to visit.   Family History  Problem Relation Age of Onset  Stroke Mother  High blood pressure (Hypertension) Mother  Hyperlipidemia (Elevated cholesterol) Mother  Coronary Artery Disease (Blocked arteries around heart) Mother  Deep vein thrombosis (DVT or abnormal blood clot formation) Mother  Cancer Mother  Non-hodgkin lymphoma  Myocardial Infarction (Heart attack) Mother  Skin cancer Father  High blood pressure (Hypertension) Father  Diabetes Father  Pneumonia Father  Stroke Father  Dementia Father    Social History   Tobacco Use  Smoking Status Former  Types: Cigarettes  Smokeless Tobacco Never  Tobacco Comments  Smoked some in college    Social History   Socioeconomic History  Marital status: Married  Tobacco Use  Smoking  status: Former  Types: Cigarettes  Smokeless tobacco: Never  Tobacco comments:  Smoked some in college  Vaping Use  Vaping status: Never Used  Substance and Sexual Activity  Alcohol use: Not Currently  Drug use: Never   Social Drivers of Health   Housing Stability: Unknown Housing Stability Vital Sign  Homeless in the Last Year: No   Objective:  There were no vitals filed for this visit.  There is no height or weight on file to calculate BMI.  Physical Exam Vitals reviewed.  Constitutional:  General: She is not in acute distress. Appearance: Normal appearance.  HENT:  Head: Normocephalic and atraumatic.  Right Ear: External ear normal.  Left Ear: External ear normal.  Nose: Nose normal.  Mouth/Throat:  Mouth: Mucous membranes are moist.  Pharynx: Oropharynx is clear.  Eyes:  General: No scleral icterus. Extraocular Movements: Extraocular movements intact.  Conjunctiva/sclera: Conjunctivae normal.  Pupils: Pupils are equal, round, and reactive to light.  Cardiovascular:  Rate and Rhythm: Normal rate and regular rhythm.  Pulses: Normal pulses.  Heart sounds: Normal heart sounds.  Pulmonary:  Effort: Pulmonary effort is normal. No respiratory distress.  Breath sounds: Normal breath sounds.  Abdominal:  General: Bowel sounds are normal.  Palpations:  Abdomen is soft.  Tenderness: There is no abdominal tenderness.  Musculoskeletal:  General: No swelling, tenderness or deformity. Normal range of motion.  Cervical back: Normal range of motion and neck supple.  Skin: General: Skin is warm and dry.  Coloration: Skin is not jaundiced.  Neurological:  General: No focal deficit present.  Mental Status: She is alert and oriented to person, place, and time.  Psychiatric:  Mood and Affect: Mood normal.  Behavior: Behavior normal.     Breast: There is a 2 cm palpable mass in the lower portion of the right breast deep. There is no palpable mass in the left breast. There  is 1 palpable lymph node in the right axilla.  Labs, Imaging and Diagnostic Testing:  Assessment and Plan:   Diagnoses and all orders for this visit:  Malignant neoplasm of lower-outer quadrant of right breast of female, estrogen receptor positive (CMS/HHS-HCC) - [facility] Case Posting Request; Future   The patient appears to have a 1.9 cm cancer in the lower portion of the right breast with 1 positive lymph node. Because of her tumor markers and positive nodes she is likely to need chemotherapy. I feel she would benefit from receiving chemotherapy in the neoadjuvant setting. This will likely shrink the cancer and downstage the axilla which would make her a good candidate for sentinel node mapping and targeted node dissection. I have talked her in detail about the different options for treatment of the breast and at this point she seems to favor breast conservation which I feel is very reasonable. She will need a port for chemotherapy. I have discussed with her in detail the risks and benefits of the operation to place the port as well as some of the technical aspects including the risk of pneumothorax and she understands and wishes to proceed. If we cannot get the port in quick enough we will plan to have interventional radiology place it so as not to delay her chemotherapy. I will see her back during treatment to monitor her progress and plan for definitive surgery at the end

## 2024-09-14 NOTE — Transfer of Care (Signed)
 Immediate Anesthesia Transfer of Care Note  Patient: Alicia Bentley  Procedure(s) Performed: INSERTION, TUNNELED CENTRAL VENOUS DEVICE, WITH PORT (Left: Chest)  Patient Location: PACU  Anesthesia Type:General  Level of Consciousness: awake, drowsy, patient cooperative, and responds to stimulation  Airway & Oxygen Therapy: Patient Spontanous Breathing  Post-op Assessment: Report given to RN and Post -op Vital signs reviewed and stable  Post vital signs: Reviewed and stable  Last Vitals:  Vitals Value Taken Time  BP 134/72 09/14/24 15:13  Temp 36.5 C 09/14/24 15:13  Pulse 79 09/14/24 15:14  Resp 14 09/14/24 15:14  SpO2 94 % 09/14/24 15:14  Vitals shown include unfiled device data.  Last Pain:  Vitals:   09/14/24 1300  TempSrc: Oral         Complications: No notable events documented.

## 2024-09-14 NOTE — Anesthesia Procedure Notes (Signed)
 Procedure Name: Intubation Date/Time: 09/14/2024 2:04 PM  Performed by: Deeann Eva BROCKS, CRNAPre-anesthesia Checklist: Patient identified, Emergency Drugs available, Suction available and Patient being monitored Patient Re-evaluated:Patient Re-evaluated prior to induction Oxygen Delivery Method: Circle System Utilized Preoxygenation: Pre-oxygenation with 100% oxygen Induction Type: IV induction and Rapid sequence Ventilation: Mask ventilation without difficulty Laryngoscope Size: Mac and 3 Grade View: Grade I Tube type: Oral Tube size: 7.0 mm Number of attempts: 1 Airway Equipment and Method: Stylet Placement Confirmation: ETT inserted through vocal cords under direct vision, positive ETCO2 and breath sounds checked- equal and bilateral Secured at: 21 cm Tube secured with: Tape Dental Injury: Teeth and Oropharynx as per pre-operative assessment

## 2024-09-15 ENCOUNTER — Encounter (HOSPITAL_COMMUNITY): Payer: Self-pay | Admitting: General Surgery

## 2024-09-15 ENCOUNTER — Encounter: Admitting: Internal Medicine

## 2024-09-17 ENCOUNTER — Inpatient Hospital Stay

## 2024-09-17 ENCOUNTER — Inpatient Hospital Stay: Attending: Hematology and Oncology | Admitting: Hematology and Oncology

## 2024-09-17 ENCOUNTER — Inpatient Hospital Stay: Attending: Hematology and Oncology

## 2024-09-17 VITALS — BP 126/58 | HR 60 | Temp 98.0°F | Resp 16 | Wt 261.6 lb

## 2024-09-17 VITALS — BP 108/56 | HR 68 | Temp 97.8°F | Resp 16

## 2024-09-17 DIAGNOSIS — C50511 Malignant neoplasm of lower-outer quadrant of right female breast: Secondary | ICD-10-CM

## 2024-09-17 DIAGNOSIS — Z5111 Encounter for antineoplastic chemotherapy: Secondary | ICD-10-CM | POA: Insufficient documentation

## 2024-09-17 DIAGNOSIS — Z79899 Other long term (current) drug therapy: Secondary | ICD-10-CM | POA: Diagnosis not present

## 2024-09-17 DIAGNOSIS — Z170 Estrogen receptor positive status [ER+]: Secondary | ICD-10-CM | POA: Diagnosis not present

## 2024-09-17 DIAGNOSIS — G620 Drug-induced polyneuropathy: Secondary | ICD-10-CM | POA: Insufficient documentation

## 2024-09-17 DIAGNOSIS — K219 Gastro-esophageal reflux disease without esophagitis: Secondary | ICD-10-CM | POA: Insufficient documentation

## 2024-09-17 DIAGNOSIS — T451X5A Adverse effect of antineoplastic and immunosuppressive drugs, initial encounter: Secondary | ICD-10-CM | POA: Diagnosis not present

## 2024-09-17 LAB — CBC WITH DIFFERENTIAL (CANCER CENTER ONLY)
Abs Immature Granulocytes: 0.01 K/uL (ref 0.00–0.07)
Basophils Absolute: 0.1 K/uL (ref 0.0–0.1)
Basophils Relative: 1 %
Eosinophils Absolute: 0.1 K/uL (ref 0.0–0.5)
Eosinophils Relative: 1 %
HCT: 37.1 % (ref 36.0–46.0)
Hemoglobin: 12.1 g/dL (ref 12.0–15.0)
Immature Granulocytes: 0 %
Lymphocytes Relative: 36 %
Lymphs Abs: 2.3 K/uL (ref 0.7–4.0)
MCH: 27.9 pg (ref 26.0–34.0)
MCHC: 32.6 g/dL (ref 30.0–36.0)
MCV: 85.5 fL (ref 80.0–100.0)
Monocytes Absolute: 0.5 K/uL (ref 0.1–1.0)
Monocytes Relative: 8 %
Neutro Abs: 3.4 K/uL (ref 1.7–7.7)
Neutrophils Relative %: 54 %
Platelet Count: 291 K/uL (ref 150–400)
RBC: 4.34 MIL/uL (ref 3.87–5.11)
RDW: 13.9 % (ref 11.5–15.5)
WBC Count: 6.4 K/uL (ref 4.0–10.5)
nRBC: 0 % (ref 0.0–0.2)

## 2024-09-17 LAB — CMP (CANCER CENTER ONLY)
ALT: <5 U/L (ref 0–44)
AST: 20 U/L (ref 15–41)
Albumin: 4.3 g/dL (ref 3.5–5.0)
Alkaline Phosphatase: 74 U/L (ref 38–126)
Anion gap: 10 (ref 5–15)
BUN: 17 mg/dL (ref 6–20)
CO2: 24 mmol/L (ref 22–32)
Calcium: 9.6 mg/dL (ref 8.9–10.3)
Chloride: 102 mmol/L (ref 98–111)
Creatinine: 0.94 mg/dL (ref 0.44–1.00)
GFR, Estimated: >60 mL/min (ref >60)
Glucose, Bld: 96 mg/dL (ref 70–99)
Potassium: 4.1 mmol/L (ref 3.5–5.1)
Sodium: 136 mmol/L (ref 135–145)
Total Bilirubin: 0.3 mg/dL (ref 0.0–1.2)
Total Protein: 6.9 g/dL (ref 6.5–8.1)

## 2024-09-17 LAB — TSH: TSH: 7.54 u[IU]/mL — ABNORMAL HIGH (ref 0.350–4.500)

## 2024-09-17 MED ORDER — SODIUM CHLORIDE 0.9 % IV SOLN
200.0000 mg | Freq: Once | INTRAVENOUS | Status: AC
  Administered 2024-09-17: 200 mg via INTRAVENOUS
  Filled 2024-09-17: qty 20

## 2024-09-17 MED ORDER — PALONOSETRON HCL INJECTION 0.25 MG/5ML
0.2500 mg | Freq: Once | INTRAVENOUS | Status: AC
  Administered 2024-09-17: 0.25 mg via INTRAVENOUS
  Filled 2024-09-17: qty 5

## 2024-09-17 MED ORDER — SODIUM CHLORIDE 0.9 % IV SOLN
80.0000 mg/m2 | Freq: Once | INTRAVENOUS | Status: AC
  Administered 2024-09-17: 180 mg via INTRAVENOUS
  Filled 2024-09-17: qty 30

## 2024-09-17 MED ORDER — SODIUM CHLORIDE 0.9 % IV SOLN
200.0000 mg | Freq: Once | INTRAVENOUS | Status: AC
  Administered 2024-09-17: 200 mg via INTRAVENOUS
  Filled 2024-09-17: qty 200

## 2024-09-17 MED ORDER — SODIUM CHLORIDE 0.9 % IV SOLN: INTRAVENOUS | Status: DC

## 2024-09-17 MED ORDER — FAMOTIDINE IN NACL 20-0.9 MG/50ML-% IV SOLN
20.0000 mg | Freq: Once | INTRAVENOUS | Status: AC
  Administered 2024-09-17: 20 mg via INTRAVENOUS
  Filled 2024-09-17: qty 50

## 2024-09-17 MED ORDER — DIPHENHYDRAMINE HCL 50 MG/ML IJ SOLN
50.0000 mg | Freq: Once | INTRAMUSCULAR | Status: AC
  Administered 2024-09-17: 50 mg via INTRAVENOUS
  Filled 2024-09-17: qty 1

## 2024-09-17 MED ORDER — DEXAMETHASONE SOD PHOSPHATE PF 10 MG/ML IJ SOLN
10.0000 mg | Freq: Once | INTRAMUSCULAR | Status: AC
  Administered 2024-09-17: 10 mg via INTRAVENOUS

## 2024-09-17 NOTE — Patient Instructions (Signed)
 [facility] - A DEPT OF [facility]  Discharge Instructions: Thank you for choosing [facility] to provide your oncology and hematology care.   If you have a lab appointment with the Cancer Center, please go directly to the Cancer Center and check in at the registration area.   Wear comfortable clothing and clothing appropriate for easy access to any Portacath or PICC line.   We strive to give you quality time with your provider. You may need to reschedule your appointment if you arrive late (15 or more minutes).  Arriving late affects you and other patients whose appointments are after yours.  Also, if you miss three or more appointments without notifying the office, you may be dismissed from the clinic at the providers discretion.      For prescription refill requests, have your pharmacy contact our office and allow 72 hours for refills to be completed.    Today you received the following chemotherapy and/or immunotherapy agents:   CARBOplatin (PARAPLATIN)  PACLitaxel (TAXOL)  pembrolizumab (KEYTRUDA)     To help prevent nausea and vomiting after your treatment, we encourage you to take your nausea medication as directed.  BELOW ARE SYMPTOMS THAT SHOULD BE REPORTED IMMEDIATELY: *FEVER GREATER THAN 100.4 F (38 C) OR HIGHER *CHILLS OR SWEATING *NAUSEA AND VOMITING THAT IS NOT CONTROLLED WITH YOUR NAUSEA MEDICATION *UNUSUAL SHORTNESS OF BREATH *UNUSUAL BRUISING OR BLEEDING *URINARY PROBLEMS (pain or burning when urinating, or frequent urination) *BOWEL PROBLEMS (unusual diarrhea, constipation, pain near the anus) TENDERNESS IN MOUTH AND THROAT WITH OR WITHOUT PRESENCE OF ULCERS (sore throat, sores in mouth, or a toothache) UNUSUAL RASH, SWELLING OR PAIN  UNUSUAL VAGINAL DISCHARGE OR ITCHING   Items with * indicate a potential emergency and should be followed up as soon as possible or go to the Emergency Department if any problems should  occur.  Please show the CHEMOTHERAPY ALERT CARD or IMMUNOTHERAPY ALERT CARD at check-in to the Emergency Department and triage nurse.  Should you have questions after your visit or need to cancel or reschedule your appointment, please contact [facility] - A DEPT OF [facility]  Dept: [phone]  and follow the prompts.  Office hours are 8:00 a.m. to 4:30 p.m. Monday - Friday. Please note that voicemails left after 4:00 p.m. may not be returned until the following business day.  We are closed weekends and major holidays. You have access to a nurse at all times for urgent questions. Please call the main number to the clinic Dept: [phone] and follow the prompts.   For any non-urgent questions, you may also contact your provider using MyChart. We now offer e-Visits for anyone 30 and older to request care online for non-urgent symptoms. For details visit [email or web address].   Also download the MyChart app! Go to the app store, search MyChart, open the app, select [place], and log in with your MyChart username and password.

## 2024-09-17 NOTE — Progress Notes (Signed)
 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 Functionally triple negative due to minimal ER positivity (2%). High-grade, High Ki 67 40%.  Discussed chemotherapy and immunotherapy options, including Keynote 522 regimen vs carbo/docetaxel with immunotherapy based on NEOPACT. Given borderline PS, I recommended she consider less intensive regimen, but she is extremely worries its not as effective as KEYNOTE 522. Discussed Starlet trial participation. She is not interested. She is now here before proceeding KEYNOTE 522.  - Reviewed imaging and echocardiogram; confirmed no new findings and no distant metastasis. - Discussed right internal mammary lymphadenopathy finding on staging scans. - Continued current chemotherapy and immunotherapy regimen as tolerated. - Scheduled follow-up for ongoing chemotherapy, port flush, laboratory monitoring, and supportive care. - Provided anticipatory guidance on infection risk and dietary precautions: avoid raw meat and salad bars, ensure thorough washing/cooking of fruits and vegetables, home-prepared salads and thoroughly washed fruits are acceptable, cook meats thoroughly. - Instructed to take loratadine (Claritin) prior to scheduled injections at the end of the month.  Gastroesophageal reflux disease Severe GERD, previously controlled on  pantoprazole  and famotidine. Potential drug interaction between pantoprazole  and chemotherapy under review. Significant symptoms without proton pump inhibitor. - Instructed to take famotidine 20 mg twice daily and pantoprazole  40 mg once daily in the morning for now. - Will consult pharmacy and oncology regarding safety of pantoprazole  with chemotherapy and update her once more information is available. - Advised to monitor for worsening GERD symptoms and report significant issues.  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 Relevant for perioperative and procedural skin preparation. - Confirmed Hibiclens  allergy in her medical record. - Reinforced strict avoidance of chlorhexidine -containing products for future procedures or skin preparation.  Port site contact dermatitis Localized burning and irritation at port site likely due to dressing or cleaning agent. No infection signs. - Will investigate specific dressing and cleaning agent used at port site. - Recommended avoidance of the same cleaning agent in the future and consideration of sensitive skin dressings if needed. - Will communicate with nursing staff to ensure appropriate skin care for future port access.   HISTORY OF PRESENTING ILLNESS:  Alicia Bentley 58 y.o. female is here because of new diagnosis of right breast cancer  Discussed the use of AI scribe software for clinical note transcription with the patient, who gave verbal consent to proceed.  History of Present Illness Alicia Bentley is a 58 year old female with ER-positive right breast cancer with regional nodal involvement who presents for oncology follow-up and management of chemotherapy-related concerns.  She is in the early phase of chemotherapy and immunotherapy for right breast cancer with right axillary and internal mammary lymph node involvement. Three days ago, she underwent port placement  and now reports burning and bruising at the port site. She attributes the irritation and sensitivity to a possible reaction to the dressing or cleaning agent, noting a known allergy to Hibiclens  and recent use of alcohol for cleaning.  She completed  a chemotherapy education class but found the information overwhelming, particularly regarding dietary restrictions and food safety during immunosuppression. She sought clarification on the safety of consuming salads, fruits, and meats during immunosuppression.  She has severe gastroesophageal reflux disease, previously managed with pantoprazole  40 mg twice daily and famotidine at bedtime. She experiences significant throat irritation and hoarseness when not on a proton pump inhibitor, with prior ENT evaluation and periods of voice rest. She expresses concern about symptom control due to potential interaction between pantoprazole  and chemotherapy. She currently takes pantoprazole  in the morning and famotidine at night, and is trialing increased famotidine dosing as discussed during the visit, pending further pharmacy input.  She takes meloxicam daily and metaxalone for musculoskeletal pain, as well as 81 mg aspirin and antihypertensives managed by her primary care provider. She is uncertain about which provider will manage her non-oncologic medications during chemotherapy, as her primary care provider suggested deferring routine care until after cancer treatment, but she has continued to refill her medications.  She recently underwent PET scan, MRI, echocardiogram, and EKG, and is aware of right breast mass, right axillary, and right internal mammary lymphadenopathy, with no evidence of distant metastasis. She denies current bowel issues and expresses concern about potential allergic reactions to paclitaxel, specifically heart rate and blood pressure changes.   All other systems were reviewed with the patient and are negative.  MEDICAL HISTORY:  Past Medical  History:  Diagnosis Date   Cystitis, interstitial    Diverticulosis of colon (without mention of hemorrhage)    DJD (degenerative joint disease)    Family history of brain tumor    Family history of breast cancer    Family history of colon cancer    Family history of liver cancer    Family history of melanoma    Family history of non-Hodgkin lymphoma    Fatty liver    GERD (gastroesophageal reflux disease)    Headache(784.0)    Hiatal hernia    Internal hemorrhoids    Irritable bowel syndrome    Miscarriage    Panic disorder    PONV (postoperative nausea and vomiting)    Tubular adenoma of colon 2010   Unspecified essential hypertension    Unspecified urinary incontinence     SURGICAL HISTORY: Past Surgical History:  Procedure Laterality Date   ABDOMINAL HYSTERECTOMY     BREAST BIOPSY Right 08/17/2024   US  RT BREAST BX W LOC DEV 1ST LESION IMG BX SPEC US  GUIDE 08/17/2024 [facility] MAMMOGRAPHY   COLONOSCOPY N/A 12/29/2012   Procedure: COLONOSCOPY;  Surgeon: Alm JONELLE Gander, MD;  Location: [facility];  Service: Endoscopy;  Laterality: N/A;   deviated septrum     NASAL SINUS SURGERY     NISSEN FUNDOPLICATION     PORTACATH PLACEMENT Left 09/14/2024   Procedure: INSERTION, TUNNELED CENTRAL VENOUS DEVICE, WITH PORT;  Surgeon: Curvin Deward MOULD, MD;  Location: [facility] OR;  Service: General;  Laterality: Left;  WITH ULTRASOUND GUIDANCE   SKIN GRAFT     right ear   TYMPANOSTOMY TUBE PLACEMENT     x 10    SOCIAL HISTORY: Social History   Socioeconomic History   Marital status: Married    Spouse name: Not on file   Number of children: 0   Years of education: Not on file   Highest education level: Not on file  Occupational History   Occupation: housewife  Tobacco Use   Smoking status: Former    Types: Cigarettes   Smokeless tobacco: Never  Tobacco comments:    college years  Vaping Use   Vaping status: Never Used  Substance and Sexual Activity   Alcohol use: No   Drug  use: No   Sexual activity: Not on file  Other Topics Concern   Not on file  Social History Narrative   Adopted daughter   Social Drivers of Health   Tobacco Use: Medium Risk (09/10/2024)   Patient History    Smoking Tobacco Use: Former    Smokeless Tobacco Use: Never    Passive Exposure: Not on Actuary Strain: Not on file  Food Insecurity: No Food Insecurity (08/26/2024)   Epic    Worried About Programme Researcher, Broadcasting/film/video in the Last Year: Never true    Ran Out of Food in the Last Year: Never true  Transportation Needs: No Transportation Needs (08/26/2024)   Epic    Lack of Transportation (Medical): No    Lack of Transportation (Non-Medical): No  Physical Activity: Not on file  Stress: Not on file  Social Connections: Not on file  Intimate Partner Violence: Not At Risk (08/26/2024)   Epic    Fear of Current or Ex-Partner: No    Emotionally Abused: No    Physically Abused: No    Sexually Abused: No  Depression (PHQ2-9): Low Risk (08/26/2024)   Depression (PHQ2-9)    PHQ-2 Score: 0  Alcohol Screen: Not on file  Housing: Low Risk (08/26/2024)   Epic    Unable to Pay for Housing in the Last Year: No    Number of Times Moved in the Last Year: 0    Homeless in the Last Year: No  Utilities: Not At Risk (08/26/2024)   Epic    Threatened with loss of utilities: No  Health Literacy: Low Risk (03/28/2022)   [facility]   Health Literacy    : Never    FAMILY HISTORY: Family History  Problem Relation Age of Onset   Heart disease Mother    Non-Hodgkin's lymphoma Mother    Stroke Mother    Hypertension Mother    Deep vein thrombosis Mother    Diabetes Father    Heart disease Father    Dementia Father    Stroke Father    Melanoma Father        mets to lymph nodes   Hypertension Father    Pancreatic cancer Paternal Uncle    Stomach cancer Paternal Uncle        mets from pancreas   Pneumonia Maternal Grandmother    Other Maternal Grandfather         esophageal problems   Stroke Paternal Grandmother    Colon cancer Neg Hx    Esophageal cancer Neg Hx     ALLERGIES:  is allergic to albuterol, codeine, hibiclens  [chlorhexidine  gluconate], tramadol , and lexapro [escitalopram oxalate].  MEDICATIONS:  Current Outpatient Medications  Medication Sig Dispense Refill   acetaminophen  (TYLENOL ) 650 MG CR tablet Take 2 tablets by mouth daily as needed.      aspirin 81 MG tablet Take 81 mg by mouth daily.     Azelastine-Fluticasone 137-50 MCG/ACT SUSP Place into the nose.     bisacodyl (DULCOLAX) 5 MG EC tablet Take 5 mg by mouth daily as needed for moderate constipation.     buPROPion (WELLBUTRIN XL) 150 MG 24 hr tablet Take 150 mg by mouth every morning.     Calcium Carb-Cholecalciferol (CALCIUM + VITAMIN D3 PO) Take 600 mg by mouth  daily.     cetirizine (ZYRTEC) 10 MG tablet Take 10 mg by mouth daily.     dexamethasone  (DECADRON ) 4 MG tablet Take 2 tablets daily for 2 days, start the day after chemotherapy. Take with food. 30 tablet 1   famotidine (PEPCID) 20 MG tablet Take 20 mg by mouth at bedtime.     hydrOXYzine (ATARAX) 25 MG tablet Take 25 mg by mouth 2 (two) times daily as needed.     hyoscyamine  (LEVSIN  SL) 0.125 MG SL tablet Place 1 tablet (0.125 mg total) under the tongue every 8 (eight) hours as needed. 30 tablet 1   lidocaine -prilocaine  (EMLA ) cream Apply to affected area once 30 g 3   linaclotide  (LINZESS ) 290 MCG CAPS capsule Take 1 capsule (290 mcg total) by mouth daily before breakfast. 90 capsule 3   losartan (COZAAR) 50 MG tablet Take 50 mg by mouth daily.     meloxicam (MOBIC) 15 MG tablet Take 15 mg by mouth daily.     metaxalone (SKELAXIN) 800 MG tablet Take 800 mg by mouth 3 (three) times daily.     metFORMIN (GLUCOPHAGE) 500 MG tablet Take 500 mg by mouth 2 (two) times daily with a meal.     Multiple Vitamins-Minerals (ONE-A-DAY WOMENS 50 PLUS PO) Take 1 capsule by mouth daily.     Omega-3 Fatty Acids (FISH OIL)  1200 MG CAPS Take 1 capsule by mouth daily.     ondansetron  (ZOFRAN ) 8 MG tablet Take 1 tablet (8 mg total) by mouth every 8 (eight) hours as needed for nausea or vomiting. Start on the third day after chemotherapy. 30 tablet 1   oxyCODONE  (ROXICODONE ) 5 MG immediate release tablet Take 1 tablet (5 mg total) by mouth every 6 (six) hours as needed. 5 tablet 0   pantoprazole  (PROTONIX ) 40 MG tablet Take 1 tablet (40 mg total) by mouth daily. (Patient taking differently: Take 40 mg by mouth 2 (two) times daily.) 30 tablet 1   polyethylene glycol powder (GLYCOLAX/MIRALAX) 17 GM/SCOOP powder Take 17 g by mouth daily. Dissolve 1 capful (17g) in 4-8 ounces of liquid and take by mouth daily.     PROBIOTIC PRODUCT PO Take 1 capsule by mouth daily.     prochlorperazine  (COMPAZINE ) 10 MG tablet Take 1 tablet (10 mg total) by mouth every 6 (six) hours as needed for nausea or vomiting. 30 tablet 1   Sodium Sulfate-Mag Sulfate-KCl (SUTAB ) 1479-225-188 MG TABS Use as directed for colonoscopy. MANUFACTURER CODES!! BIN: [id number] PCN: CN GROUP: [id number] MEMBER ID: [id number];MLW AS SECONDARY INSURANCE ;NO PRIOR AUTHORIZATION 24 tablet 0   triamterene-hydrochlorothiazide (MAXZIDE) 75-50 MG per tablet Take 1 tablet by mouth daily.     ZEPBOUND 12.5 MG/0.5ML Pen Inject 12.5 mg into the skin once a week.     No current facility-administered medications for this visit.     PHYSICAL EXAMINATION: ECOG PERFORMANCE STATUS: 1 - Symptomatic but completely ambulatory  BP (!) 126/58 (BP Location: Left Arm, Patient Position: Sitting)   Pulse 60   Temp 98 F (36.7 C) (Temporal)   Resp 16   Wt 261 lb 9.6 oz (118.7 kg)   SpO2 97%   BMI 44.90 kg/m   GENERAL:alert, no distress and comfortable, obese. LYMPH:  palpable right axillary adenopathy, breast exam deferred today LUNGS: clear to auscultation and percussion with normal breathing effort HEART: regular rate & rhythm and no murmurs and no lower extremity  edema ABDOMEN:abdomen soft, non-tender and normal bowel sounds Musculoskeletal:no cyanosis of digits and  no clubbing  PSYCH: alert & oriented x 3 with fluent speech NEURO: no focal motor/sensory deficits  LABORATORY DATA:  I have reviewed the data as listed Lab Results  Component Value Date   WBC 6.4 09/17/2024   HGB 12.1 09/17/2024   HCT 37.1 09/17/2024   MCV 85.5 09/17/2024   PLT 291 09/17/2024     Chemistry      Component Value Date/Time   NA 137 08/26/2024 1259   K 3.9 08/26/2024 1259   CL 99 08/26/2024 1259   CO2 28 08/26/2024 1259   BUN 18 08/26/2024 1259   CREATININE 0.93 08/26/2024 1259      Component Value Date/Time   CALCIUM 10.3 08/26/2024 1259   ALKPHOS 91 08/26/2024 1259   AST 16 08/26/2024 1259   ALT <5 08/26/2024 1259   BILITOT 0.5 08/26/2024 1259       RADIOGRAPHIC STUDIES: I have personally reviewed the radiological images as listed and agreed with the findings in the report. DG CHEST PORT 1 VIEW Result Date: 09/14/2024 CLINICAL DATA:  Port-A-Cath EXAM: PORTABLE CHEST 1 VIEW COMPARISON:  08/05/2013, PET CT 08/28/2024 FINDINGS: Interim placement of left-sided central venous port with tip projecting over the upper SVC. Hypoventilatory changes with mild atelectasis and or scarring at left base. No pneumothorax is seen. Cardiac size is normal IMPRESSION: Interim placement of left-sided central venous port with tip projecting over the upper SVC. No pneumothorax. Electronically Signed   By: Luke Bun M.D.   On: 09/14/2024 15:57   DG C-Arm 1-60 Min-No Report Result Date: 09/14/2024 Fluoroscopy was utilized by the requesting physician.  No radiographic interpretation.   ECHOCARDIOGRAM COMPLETE Result Date: 09/04/2024    ECHOCARDIOGRAM REPORT   Patient Name:   Alicia Bentley [facility] Date of Exam: 09/04/2024 Medical Rec #:  [id number]             Height:       64.0 in Accession #:    [id number]            Weight:       253.2 lb Date of Birth:  [birth year]              BSA:          2.163 m Patient Age:    58 years              BP:           138/77 mmHg Patient Gender: F                     HR:           61 bpm. Exam Location:  Outpatient Procedure: 2D Echo, Cardiac Doppler, Color Doppler and Strain Analysis (Both            Spectral and Color Flow Doppler were utilized during procedure). Indications:    CHEMO Z09  History:        Patient has no prior history of Echocardiogram examinations.                 Cancer; Risk Factors:Hypertension and Former Smoker.  Sonographer:    Koleen Popper RDCS Referring Phys: [id number] Tevita Gomer  Sonographer Comments: Global longitudinal strain was attempted. IMPRESSIONS  1. Left ventricular ejection fraction, by estimation, is 60 to 65%. The left ventricle has normal function. The left ventricle has no regional wall motion abnormalities. Left ventricular diastolic parameters were normal. The average left ventricular global longitudinal strain is -18.9 %.  The global longitudinal strain is normal.  2. Right ventricular systolic function is normal. The right ventricular size is normal. There is normal pulmonary artery systolic pressure. The estimated right ventricular systolic pressure is 24.0 mmHg.  3. Left atrial size was mildly dilated.  4. The mitral valve is normal in structure. Trivial mitral valve regurgitation. No evidence of mitral stenosis.  5. The aortic valve is tricuspid. Aortic valve regurgitation is not visualized. No aortic stenosis is present.  6. The inferior vena cava is dilated in size with >50% respiratory variability, suggesting right atrial pressure of 8 mmHg. FINDINGS  Left Ventricle: Left ventricular ejection fraction, by estimation, is 60 to 65%. The left ventricle has normal function. The left ventricle has no regional wall motion abnormalities. The average left ventricular global longitudinal strain is -18.9 %. Strain was performed and the global longitudinal strain is normal. The left ventricular internal cavity  size was normal in size. There is no left ventricular hypertrophy. Left ventricular diastolic parameters were normal. Right Ventricle: The right ventricular size is normal. No increase in right ventricular wall thickness. Right ventricular systolic function is normal. There is normal pulmonary artery systolic pressure. The tricuspid regurgitant velocity is 2.00 m/s, and  with an assumed right atrial pressure of 8 mmHg, the estimated right ventricular systolic pressure is 24.0 mmHg. Left Atrium: Left atrial size was mildly dilated. Right Atrium: Right atrial size was normal in size. Pericardium: There is no evidence of pericardial effusion. Mitral Valve: The mitral valve is normal in structure. Mild mitral annular calcification. Trivial mitral valve regurgitation. No evidence of mitral valve stenosis. Tricuspid Valve: The tricuspid valve is normal in structure. Tricuspid valve regurgitation is trivial. No evidence of tricuspid stenosis. Aortic Valve: The aortic valve is tricuspid. Aortic valve regurgitation is not visualized. No aortic stenosis is present. Pulmonic Valve: The pulmonic valve was normal in structure. Pulmonic valve regurgitation is trivial. No evidence of pulmonic stenosis. Aorta: The aortic root is normal in size and structure. Venous: The inferior vena cava is dilated in size with greater than 50% respiratory variability, suggesting right atrial pressure of 8 mmHg. IAS/Shunts: No atrial level shunt detected by color flow Doppler.  LEFT VENTRICLE PLAX 2D LVIDd:         4.80 cm   Diastology LVIDs:         2.70 cm   LV e' medial:    7.77 cm/s LV PW:         0.80 cm   LV E/e' medial:  11.4 LV IVS:        1.20 cm   LV e' lateral:   12.00 cm/s LVOT diam:     2.20 cm   LV E/e' lateral: 7.4 LV SV:         95 LV SV Index:   44        2D Longitudinal Strain LVOT Area:     3.80 cm  2D Strain GLS Avg:     -18.9 %  RIGHT VENTRICLE             IVC RV Basal diam:  3.80 cm     IVC diam: 2.50 cm RV S prime:      15.70 cm/s TAPSE (M-mode): 2.0 cm LEFT ATRIUM             Index        RIGHT ATRIUM           Index LA diam:  4.30 cm 1.99 cm/m   RA Area:     12.10 cm LA Vol (A2C):   51.0 ml 23.58 ml/m  RA Volume:   25.30 ml  11.70 ml/m LA Vol (A4C):   40.0 ml 18.50 ml/m LA Biplane Vol: 45.4 ml 20.99 ml/m  AORTIC VALVE LVOT Vmax:   110.00 cm/s LVOT Vmean:  69.000 cm/s LVOT VTI:    0.249 m  AORTA Ao Root diam: 3.10 cm Ao Asc diam:  3.30 cm MITRAL VALVE               TRICUSPID VALVE MV Area (PHT): 3.60 cm    TR Peak grad:   16.0 mmHg MV Decel Time: 211 msec    TR Vmax:        200.00 cm/s MR Peak grad: 69.4 mmHg MR Vmax:      416.50 cm/s  SHUNTS MV E velocity: 88.90 cm/s  Systemic VTI:  0.25 m MV A velocity: 72.70 cm/s  Systemic Diam: 2.20 cm MV E/A ratio:  1.22 Mihai Croitoru MD Electronically signed by Jerel Balding MD Signature Date/Time: 09/04/2024/9:18:09 AM    Final    MR BREAST BILATERAL W WO CONTRAST INC CAD Result Date: 09/01/2024 CLINICAL DATA:  58 year old female with recent diagnosis metastatic right breast cancer. MRI for disease extent. EXAM: BILATERAL BREAST MRI WITH AND WITHOUT CONTRAST TECHNIQUE: Multiplanar, multisequence MR images of both breasts were obtained prior to and following the intravenous administration of 10 ml of Three-dimensional MR images were rendered by post-processing of the original MR data on an independent workstation. The three-dimensional MR images were interpreted, and findings are reported in the following complete MRI report for this study. Three dimensional images were evaluated at the independent interpreting workstation using the DynaCAD thin client. COMPARISON:  Previous exam(s). FINDINGS: Breast composition: a. Almost entirely fat. Background parenchymal enhancement: Mild Right breast: Biopsy clip within a spiculated enhancing mass measuring approximately 2.5 x 2 x 1.9 cm about the lower slightly outer breast, posterior third. Left breast: No mass or abnormal  enhancement. Lymph nodes: Biopsy clip within an enlarged right level 1 lymph node measuring up to 1.9 cm. No suspicious lymph nodes on the left. Ancillary findings:  None. IMPRESSION: Biopsy-proven right breast cancer with metastasis to the right axilla as above. RECOMMENDATION: Surgical/oncology consultation with excision when clinically appropriate. BI-RADS CATEGORY  6: Known biopsy-proven malignancy. Electronically Signed   By: Curtistine Noble   On: 09/01/2024 15:17   NM PET Image Initial (PI) Skull Base To Thigh Result Date: 08/31/2024 CLINICAL DATA:  Initial treatment strategy for triple negative breast cancer. Bone pain. EXAM: NUCLEAR MEDICINE PET SKULL BASE TO THIGH TECHNIQUE: 12.61 mCi F-18 FDG was injected intravenously. Full-ring PET imaging was performed from the skull base to thigh after the radiotracer. CT data was obtained and used for attenuation correction and anatomic localization. Fasting blood glucose: 90 mg/dl COMPARISON:  Abdominopelvic CT 11/05/2018. Recent breast imaging studies. FINDINGS: Mediastinal blood pool activity: SUV max 3.2 NECK: No hypermetabolic cervical lymph nodes are identified. No suspicious activity identified within the pharyngeal mucosal space. Incidental CT findings: Left maxillary sinus mucous retention cyst. CHEST: Lobulated mass centrally in the right breast containing a biopsy clip and measuring 2.3 cm on image 79/4 is markedly hypermetabolic with an SUV max of 22.1. There is a hypermetabolic right axillary lymph node measuring 1.9 x 1.4 cm on image 65/4 (SUV max 27.6). There is a small hypermetabolic right internal mammary lymph node measuring 6 mm short axis on  image 66/4 (SUV max 7.6). Probable additional smaller hypermetabolic internal mammary node more inferiorly on the right (image 71/4). No hypermetabolic mediastinal or left axillary adenopathy. No hypermetabolic pulmonary activity or suspicious nodularity. Incidental CT findings: none ABDOMEN/PELVIS: There  is no hypermetabolic activity within the liver, adrenal glands, spleen or pancreas. There is no hypermetabolic nodal activity in the abdomen or pelvis. Bowel activity within physiologic limits. Incidental CT findings: Postsurgical changes at the gastroesophageal junction. Previous hysterectomy. Mild aortic atherosclerosis. SKELETON: There is no hypermetabolic activity to suggest osseous metastatic disease. Minimally heterogeneous marrow activity, within physiologic limits. Incidental CT findings: Mild spondylosis. IMPRESSION: 1. Hypermetabolic right breast mass consistent with known breast cancer. 2. Hypermetabolic right axillary and right internal mammary lymph nodes consistent with nodal metastases. 3. No evidence of distant metastatic disease. 4.  Aortic Atherosclerosis (ICD10-I70.0). Electronically Signed   By: Elsie Perone M.D.   On: 08/31/2024 15:19    All questions were answered. The patient knows to call the clinic with any problems, questions or concerns. I spent 40 minutes in the care of this patient including H and P, review of records, counseling and coordination of care. 60 minutes were spent face to face.     Amber Stalls, MD 09/17/2024 10:27 AM

## 2024-09-17 NOTE — Patient Instructions (Signed)
 Lidocaine ; Prilocaine  Cream What is this medication? LIDOCAINE ; PRILOCAINE  (LYE do kane; PRYE lo kane) prevents pain during a procedure. It numbs the area where it is applied, which blocks your nerves from sending pain signals to your brain. It belongs to a group of medications called local anesthetics. This medicine may be used for other purposes; ask your health care provider or pharmacist if you have questions. COMMON BRAND NAME(S): ANODYNE LPT, DermacinRx, DermacinRx Prizopak, EMLA , EmReal , IV Novice Pack, Leva Set, Lido-Prilo Caine, Lidotor , LiProZonePak, LIVIXIL Pak, LP [employer], [employer] with [employer], Microvix LP, Nuvakaan, Nuvakaan-II, Port-Prep, PrepIV, PRILOHEAL PLUS 30, Prilovix, Prilovix Lite, Prilovix [employer], Prilovix Plus, Prilovix Ultralite, Prilovix [employer], Prilovixil, Prizopak-II, REAL HEAL-I, Relador, Soluline, SOLUPICC, VallaDerm-90 What should I tell my care team before I take this medication? They need to know if you have any of these conditions: G6PD deficiency Have higher than normal levels of methemoglobin Heart disease Kidney disease Large areas of burned or damaged skin Liver disease Skin conditions or disease An unusual or allergic reaction to lidocaine , prilocaine , other medications, foods, dyes, or preservatives Pregnant or trying to get pregnant Breastfeeding How should I use this medication? Apply this medication to the affected area. Use your fingertips or cotton swabs. Wash your hands before and after use. Use it as directed on the prescription label. Do not use it more often than directed. Talk to your care team about the use of this medication in children. While it may be prescribed for children for selected conditions, precautions do apply. Overdosage: If you think you have taken too much of this medicine contact a poison control center or emergency room at once. NOTE: This medicine is only for you. Do not share this medicine with  others. What if I miss a dose? This does not apply. This medication is not for regular use. What may interact with this medication? This medicine may interact with the following medications: acetaminophen  certain antibiotics like dapsone, nitrofurantoin, aminosalicylic acid, sulfasalazine certain medicines for seizures like phenobarbital, phenytoin, valproic acid chloroquine cyclophosphamide flutamide hydroxyurea ifosfamide metoclopramide nitroglycerin other local anesthetics like pramoxine, tetracaine primaquine quinine This list may not describe all possible interactions. Give your health care provider a list of all the medicines, herbs, non-prescription drugs, or dietary supplements you use. Also tell them if you smoke, drink alcohol, or use illegal drugs. Some items may interact with your medicine. This list may not describe all possible interactions. Give your health care provider a list of all the medicines, herbs, non-prescription drugs, or dietary supplements you use. Also tell them if you smoke, drink alcohol, or use illegal drugs. Some items may interact with your medicine. What should I watch for while using this medication? Visit your care team for regular checks on your progress. Tell your care team if your symptoms do not start to get better or if they get worse. Be careful to avoid injury while the area is numb, and you are not aware of pain. What side effects may I notice from receiving this medication? Side effects that you should report to your care team as soon as possible: Allergic reactions--skin rash, itching, hives, swelling of the face, lips, tongue, or throat Headache, unusual weakness or fatigue, shortness of breath, nausea, vomiting, rapid heartbeat, blue skin or lips, which may be signs of methemoglobinemia Heart rhythm changes--fast or irregular heartbeat, dizziness, feeling faint or lightheaded, chest pain, trouble breathing Side effects that usually do not  require medical attention (report these to your  care team if they continue or are bothersome): Change in skin color Irritation at application site This list may not describe all possible side effects. Call your doctor for medical advice about side effects. You may report side effects to FDA at 1-800-FDA-1088. Where should I keep my medication? Keep out of the reach of children and pets. Store at room temperature between 20 and 25 degrees C (68 and 77 degrees F). Get rid of any unused medication after the expiration date. To get rid of medications that are no longer needed or have expired: Take the medication to a medication take-back program. Check with your pharmacy or law enforcement to find a location. If you cannot return the medication, check the label or package insert to see if the medication should be thrown out in the garbage or flushed down the toilet. If you are not sure, ask your care team. If it is safe to put it in the trash, empty the medication out of the container. Mix the medication with cat litter, dirt, coffee grounds, or other unwanted substance. Seal the mixture in a bag or container. Put it in the trash. NOTE: This sheet is a summary. It may not cover all possible information. If you have questions about this medicine, talk to your doctor, pharmacist, or health care provider.  2024 Elsevier/Gold Standard (2023-09-06 00:00:00)Implanted [facility] Guide An implanted port is a device that is placed under the skin. It is usually placed in the chest. The device may vary based on the need. Implanted ports can be used to give IV medicine, to take blood, or to give fluids. You may have an implanted port if: You need IV medicine that would be irritating to the small veins in your hands or arms. You need IV medicines, such as chemotherapy, for a long period of time. You need IV nutrition for a long period of time. You may have fewer limitations when using a port than you would if you  used other types of long-term IVs. You will also likely be able to return to normal activities after your incision heals. An implanted port has two main parts: Reservoir. The reservoir is the part where a needle is inserted to give medicines or draw blood. The reservoir is round. After the port is placed, it appears as a small, raised area under your skin. Catheter. The catheter is a small, thin tube that connects the reservoir to a vein. Medicine that is inserted into the reservoir goes into the catheter and then into the vein. How is my port accessed? To access your port: A numbing cream may be placed on the skin over the port site. Your health care provider will put on a mask and sterile gloves. The skin over your port will be cleaned carefully with a germ-killing soap and allowed to dry. Your health care provider will gently pinch the port and insert a needle into it. Your health care provider will check for a blood return to make sure the port is in the vein and is still working (patent). If your port needs to remain accessed to get medicine continuously (constant infusion), your health care provider will place a clear bandage (dressing) over the needle site. The dressing and needle will need to be changed every week, or as told by your health care provider. What is flushing? Flushing helps keep the port working. Follow instructions from your health care provider about how and when to flush the port. Ports are usually flushed with saline  solution or a medicine called heparin . The need for flushing will depend on how the port is used: If the port is only used from time to time to give medicines or draw blood, the port may need to be flushed: Before and after medicines have been given. Before and after blood has been drawn. As part of routine maintenance. Flushing may be recommended every 4-6 weeks. If a constant infusion is running, the port may not need to be flushed. Throw away any syringes  in a disposal container that is meant for sharp items (sharps container). You can buy a sharps container from a pharmacy, or you can make one by using an empty hard plastic bottle with a cover. How long will my port stay implanted? The port can stay in for as long as your health care provider thinks it is needed. When it is time for the port to come out, a surgery will be done to remove it. The surgery will be similar to the procedure that was done to put the port in. Follow these instructions at home: Caring for your port and port site Flush your port as told by your health care provider. If you need an infusion over several days, follow instructions from your health care provider about how to take care of your port site. Make sure you: Change your dressing as told by your health care provider. Wash your hands with soap and water for at least 20 seconds before and after you change your dressing. If soap and water are not available, use alcohol-based hand sanitizer. Place any used dressings or infusion bags into a plastic bag. Throw that bag in the trash. Keep the dressing that covers the needle clean and dry. Do not get it wet. Do not use scissors or sharp objects near the infusion tubing. Keep any external tubes clamped, unless they are being used. Check your port site every day for signs of infection. Check for: Redness, swelling, or pain. Fluid or blood. Warmth. Pus or a bad smell. Protect the skin around the port site. Avoid wearing bra straps that rub or irritate the site. Protect the skin around your port from seat belts. Place a soft pad over your chest if needed. Bathe or shower as told by your health care provider. The site may get wet as long as you are not actively receiving an infusion. General instructions  Return to your normal activities as told by your health care provider. Ask your health care provider what activities are safe for you. Carry a medical alert card or wear a  medical alert bracelet at all times. This will let health care providers know that you have an implanted port in case of an emergency. Where to find more information American Cancer Society: www.cancer.org American Society of Clinical Oncology: www.cancer.net Contact a health care provider if: You have a fever or chills. You have redness, swelling, or pain at the port site. You have fluid or blood coming from your port site. Your incision feels warm to the touch. You have pus or a bad smell coming from the port site. Summary Implanted ports are usually placed in the chest for long-term IV access. Follow instructions from your health care provider about flushing the port and changing bandages (dressings). Take care of the area around your port by avoiding clothing that puts pressure on the area, and by watching for signs of infection. Protect the skin around your port from seat belts. Place a soft pad over your chest  if needed. Contact a health care provider if you have a fever or you have redness, swelling, pain, fluid, or a bad smell at the port site. This information is not intended to replace advice given to you by your health care provider. Make sure you discuss any questions you have with your health care provider. Document Revised: 03/28/2021 Document Reviewed: 03/28/2021 Elsevier Patient Education  2024 [employer].

## 2024-09-18 ENCOUNTER — Telehealth: Payer: Self-pay | Admitting: *Deleted

## 2024-09-18 LAB — T4: T4, Total: 8.8 ug/dL (ref 4.5–12.0)

## 2024-09-18 NOTE — Telephone Encounter (Signed)
-----   Message from Alicia Stalls, MD sent at 09/17/2024  4:49 PM EST ----- Regarding: RE: PPI and Pembrolizumab She takes pepcid and PPI.  I will ask her to cut down the dose of PPI to half.  [name], call tomorrow. According to pharmacy, immunotherapy's effect can be damped by using PPI's. Its not an absolute contraindication. So ok to take pepcid BID and pantoprazole  once a day. ----- Message ----- From: Geoffry Asberry FALCON, [facility] Sent: 09/17/2024   3:46 PM EST To: Alicia Stalls, MD; Charlott CHRISTELLA Hamilton, CPhT Subject: PPI and Pembrolizumab                          Hi Dr. Stalls,  PPIs can decrease efficacy of pembrolizumab. It's a category C interaction - with info coming from a meta-analysis. It's not an absolute contraindication, however anytime there are reports of  possible decreased efficacy, I would recommend avoiding the offending agent if at all possible.   I would recommend famotidine as an alternative (I know it's not as strong of an agent, however it doesn't have the interaction with pembrolizumab that PPIs do)  Thanks, Asberry ----- Message ----- From: Hamilton Charlott CHRISTELLA, CPhT Sent: 09/17/2024   3:32 PM EST To: Asberry FALCON Geoffry, RPH  Hi,  Dr. Stalls I'm adding Asberry in to assist. ----- Message ----- From: Bentley Amber, MD Sent: 09/17/2024  10:34 AM EST To: Charlott CHRISTELLA Hamilton, CPhT  [name],  Apparently [name] asked Ms Marna to stop taking Pantoprazole  and he didn't send me any message on this nor recommended an alternate. Can you take a look into it and she said she can't go without it.  What is the degree of interaction, what are the other alternatives.

## 2024-09-18 NOTE — Telephone Encounter (Signed)
Per Dr.Iruku, called pt with message below. Pt verbalized understanding

## 2024-09-24 ENCOUNTER — Other Ambulatory Visit: Payer: Self-pay | Admitting: Pharmacist

## 2024-09-24 ENCOUNTER — Inpatient Hospital Stay: Admitting: Hematology and Oncology

## 2024-09-24 ENCOUNTER — Inpatient Hospital Stay

## 2024-09-24 VITALS — BP 123/61 | HR 64 | Temp 98.0°F | Resp 16 | Wt 258.8 lb

## 2024-09-24 VITALS — BP 118/60 | HR 70

## 2024-09-24 DIAGNOSIS — C50511 Malignant neoplasm of lower-outer quadrant of right female breast: Secondary | ICD-10-CM

## 2024-09-24 DIAGNOSIS — Z170 Estrogen receptor positive status [ER+]: Secondary | ICD-10-CM | POA: Diagnosis not present

## 2024-09-24 DIAGNOSIS — Z5111 Encounter for antineoplastic chemotherapy: Secondary | ICD-10-CM | POA: Diagnosis not present

## 2024-09-24 LAB — CBC WITH DIFFERENTIAL (CANCER CENTER ONLY)
Abs Immature Granulocytes: 0.04 K/uL (ref 0.00–0.07)
Basophils Absolute: 0 K/uL (ref 0.0–0.1)
Basophils Relative: 1 %
Eosinophils Absolute: 0.1 K/uL (ref 0.0–0.5)
Eosinophils Relative: 2 %
HCT: 35.4 % — ABNORMAL LOW (ref 36.0–46.0)
Hemoglobin: 11.6 g/dL — ABNORMAL LOW (ref 12.0–15.0)
Immature Granulocytes: 1 %
Lymphocytes Relative: 27 %
Lymphs Abs: 1.6 K/uL (ref 0.7–4.0)
MCH: 27.9 pg (ref 26.0–34.0)
MCHC: 32.8 g/dL (ref 30.0–36.0)
MCV: 85.1 fL (ref 80.0–100.0)
Monocytes Absolute: 0.4 K/uL (ref 0.1–1.0)
Monocytes Relative: 7 %
Neutro Abs: 3.7 K/uL (ref 1.7–7.7)
Neutrophils Relative %: 62 %
Platelet Count: 267 K/uL (ref 150–400)
RBC: 4.16 MIL/uL (ref 3.87–5.11)
RDW: 13.7 % (ref 11.5–15.5)
WBC Count: 5.9 K/uL (ref 4.0–10.5)
nRBC: 0 % (ref 0.0–0.2)

## 2024-09-24 LAB — CMP (CANCER CENTER ONLY)
ALT: <5 U/L (ref 0–44)
AST: 16 U/L (ref 15–41)
Albumin: 4.1 g/dL (ref 3.5–5.0)
Alkaline Phosphatase: 76 U/L (ref 38–126)
Anion gap: 10 (ref 5–15)
BUN: 16 mg/dL (ref 6–20)
CO2: 26 mmol/L (ref 22–32)
Calcium: 9.9 mg/dL (ref 8.9–10.3)
Chloride: 100 mmol/L (ref 98–111)
Creatinine: 0.9 mg/dL (ref 0.44–1.00)
GFR, Estimated: >60 mL/min (ref >60)
Glucose, Bld: 84 mg/dL (ref 70–99)
Potassium: 3.8 mmol/L (ref 3.5–5.1)
Sodium: 136 mmol/L (ref 135–145)
Total Bilirubin: 0.5 mg/dL (ref 0.0–1.2)
Total Protein: 6.6 g/dL (ref 6.5–8.1)

## 2024-09-24 MED ORDER — SODIUM CHLORIDE 0.9 % IV SOLN
202.2000 mg | Freq: Once | INTRAVENOUS | Status: AC
  Administered 2024-09-24: 13:00:00 200 mg via INTRAVENOUS
  Filled 2024-09-24: qty 20

## 2024-09-24 MED ORDER — DEXAMETHASONE SOD PHOSPHATE PF 10 MG/ML IJ SOLN
10.0000 mg | Freq: Once | INTRAMUSCULAR | Status: AC
  Administered 2024-09-24: 11:00:00 10 mg via INTRAVENOUS

## 2024-09-24 MED ORDER — SODIUM CHLORIDE 0.9 % IV SOLN
80.0000 mg/m2 | Freq: Once | INTRAVENOUS | Status: AC
  Administered 2024-09-24: 12:00:00 180 mg via INTRAVENOUS
  Filled 2024-09-24: qty 30

## 2024-09-24 MED ORDER — DIPHENHYDRAMINE HCL 50 MG/ML IJ SOLN
50.0000 mg | Freq: Once | INTRAMUSCULAR | Status: AC
  Administered 2024-09-24: 11:00:00 50 mg via INTRAVENOUS
  Filled 2024-09-24: qty 1

## 2024-09-24 MED ORDER — SODIUM CHLORIDE 0.9 % IV SOLN: INTRAVENOUS | Status: DC

## 2024-09-24 MED ORDER — PALONOSETRON HCL INJECTION 0.25 MG/5ML
0.2500 mg | Freq: Once | INTRAVENOUS | Status: AC
  Administered 2024-09-24: 11:00:00 0.25 mg via INTRAVENOUS
  Filled 2024-09-24: qty 5

## 2024-09-24 MED ORDER — FAMOTIDINE IN NACL 20-0.9 MG/50ML-% IV SOLN
20.0000 mg | Freq: Once | INTRAVENOUS | Status: AC
  Administered 2024-09-24: 11:00:00 20 mg via INTRAVENOUS
  Filled 2024-09-24: qty 50

## 2024-09-24 NOTE — Progress Notes (Signed)
 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 Functionally triple negative due to minimal ER positivity (2%). High-grade, High Ki 67 40%.  Discussed chemotherapy and immunotherapy options, including Keynote 522 regimen vs carbo/docetaxel with immunotherapy based on NEOPACT. Given borderline PS, I recommended she consider less intensive regimen, but she is extremely worries its not as effective as KEYNOTE 522. She is now on neoadj KEYNOTE 522.  Breast cancer undergoing chemotherapy She completed one chemotherapy cycle with tolerable adverse effects, including transient peripheral neuropathy and gastrointestinal symptoms ( she has history of severe acid reflux).  - No immediate thyroid intervention needed. - Scheduled follow-up for chemotherapy administration and monitoring. - Deferred breast examination due to early treatment stage.  Chemotherapy-induced peripheral neuropathy Developed numbness and paresthesia in left arm and bilateral lower extremities post-chemotherapy. Early onset is concerning.  - Monitor neuropathy after next chemotherapy cycle. - Discussed potential chemotherapy dose reduction if neuropathy persists.  Gastroesophageal reflux disease with throat pain Continues to experience reflux and throat pain despite famotidine  and pantoprazole . Awaiting  gastroenterology input. - Contacted gastroenterologist Dr. Gordy Body for GERD management recommendations. - Continue current acid suppression regimen until further guidance.  Central venous port site pain and healing Persistent tenderness and pruritus at port site, consistent with healing. Previous allergic reaction to dressing material noted. - Educated on infection signs (increased pain, erythema, discharge). - Avoid allergenic dressing materials.  Laboratory abnormality: Elevated TSH Elevated TSH with normal total T4. No intervention unless TSH exceeds 10 or hypothyroid symptoms develop. Ongoing surveillance appropriate. - Monitor TSH and thyroid function periodically.   HISTORY OF PRESENTING ILLNESS:  Alicia Bentley 58 y.o. female is here because of new diagnosis of right breast cancer  Discussed the use of AI scribe software for clinical note transcription with the patient, who gave verbal consent to proceed.  History of Present Illness   Alicia Bentley is a 58 year old female with breast cancer undergoing chemotherapy who presents for follow-up of treatment-related symptoms and medication management.  She has completed one cycle of Taxol  chemotherapy and reports significant numbness and paresthesia in her left arm and bilateral lower extremities, most pronounced the night after infusion and persisting into the following day. She has initiated use of compression stockings for symptomatic relief. She describes the requirement to keep her hands and feet in ice during Taxol  administration as extremely painful, likening the sensation to frostbite.  She continues to experience persistent gastroesophageal reflux and odynophagia despite treatment with Pepcid  twice daily and pantoprazole  once daily. Throat pain is constant and necessitates frequent use of lozenges. She expresses concern regarding potential drug interactions between pantoprazole  and chemotherapy. She is awaiting  further recommendations from gastroenterology regarding reflux management.  Her central venous port site remains tender and sore, with increased sensitivity to touch and discomfort from clothing. She notes pruritus around the port site, which she attributes to healing, and recalls a prior allergic reaction to the dressing material. She denies fever, erythema, or drainage at the  site. The area was mildly warm during the first week after placement but has since improved.  She is aware of a recent laboratory finding of elevated TSH with normal total T4 and is asymptomatic for hypothyroidism.   All other systems were reviewed with the patient and are negative.  MEDICAL HISTORY:  Past Medical History:  Diagnosis Date   Cystitis, interstitial    Diverticulosis of colon (without mention of hemorrhage)    DJD (degenerative joint disease)    Family history of brain tumor    Family history of breast cancer    Family history of colon cancer    Family history of liver cancer    Family history of melanoma    Family history of non-Hodgkin lymphoma    Fatty liver    GERD (gastroesophageal reflux disease)    Headache(784.0)    Hiatal hernia    Internal hemorrhoids    Irritable bowel syndrome    Miscarriage    Panic disorder    PONV (postoperative nausea and vomiting)    Tubular adenoma of colon 2010   Unspecified essential hypertension    Unspecified urinary incontinence     SURGICAL HISTORY: Past Surgical History:  Procedure Laterality Date   ABDOMINAL HYSTERECTOMY     BREAST BIOPSY Right 08/17/2024   US  RT BREAST BX W LOC DEV 1ST LESION IMG BX SPEC US  GUIDE 08/17/2024 [facility]MO[facility]AP[facility]   COLONOSCOPY N/A 12/29/2012   Procedure: COLONOSCOPY;  Surgeon: Alm JONELLE Gander, MD;  Location: [facility];  Service: Endoscopy;  Laterality: N/A;   deviated septrum     NASAL SINUS SURGERY     NISSEN FUNDOPLICATION     PORTACATH PLACEMENT Left 09/14/2024   Procedure: INSERTION, TUNNELED CENTRAL  VENOUS DEVICE, WITH PORT;  Surgeon: Curvin Deward MOULD, MD;  Location: [facility] OR;  Service: General;  Laterality: Left;  WITH ULTRASOUND GUIDANCE   SKIN GRAFT     right ear   TYMPANOSTOMY TUBE PLACEMENT     x 10    SOCIAL HISTORY: Social History   Socioeconomic History   Marital status: Married    Spouse name: Not on file   Number of children: 0   Years of education: Not on file   Highest education level: Not on file  Occupational History   Occupation: housewife  Tobacco Use   Smoking status: Former    Types: Cigarettes   Smokeless tobacco: Never   Tobacco comments:    college years  Vaping Use   Vaping status: Never Used  Substance and Sexual Activity   Alcohol use: No   Drug use: No   Sexual activity: Not on file  Other Topics Concern   Not on file  Social History Narrative   Adopted daughter   Social Drivers of Health   Tobacco Use: Medium Risk (09/10/2024)   Patient History    Smoking Tobacco Use: Former    Smokeless Tobacco Use: Never    Passive Exposure: Not on Actuary Strain: Not on file  Food Insecurity: No Food Insecurity (08/26/2024)   Epic    Worried About Programme Researcher, Broadcasting/film/video in the Last Year: Never true    Ran Out of Food in the Last Year: Never true  Transportation Needs: No Transportation Needs (08/26/2024)   Epic    Lack of Transportation (Medical): No    Lack of Transportation (Non-Medical): No  Physical Activity: Not on file  Stress: Not on file  Social Connections: Not on file  Intimate Partner  Violence: Not At Risk (08/26/2024)   Epic    Fear of Current or Ex-Partner: No    Emotionally Abused: No    Physically Abused: No    Sexually Abused: No  Depression (PHQ2-9): Low Risk (08/26/2024)   Depression (PHQ2-9)    PHQ-2 Score: 0  Alcohol Screen: Not on file  Housing: Low Risk (08/26/2024)   Epic    Unable to Pay for Housing in the Last Year: No    Number of Times Moved in the Last Year: 0    Homeless in the Last Year: No   Utilities: Not At Risk (08/26/2024)   Epic    Threatened with loss of utilities: No  Health Literacy: Low Risk (03/28/2022)   Received from [facility]   Health Literacy    : Never    FAMILY HISTORY: Family History  Problem Relation Age of Onset   Heart disease Mother    Non-Hodgkin's lymphoma Mother    Stroke Mother    Hypertension Mother    Deep vein thrombosis Mother    Diabetes Father    Heart disease Father    Dementia Father    Stroke Father    Melanoma Father        mets to lymph nodes   Hypertension Father    Pancreatic cancer Paternal Uncle    Stomach cancer Paternal Uncle        mets from pancreas   Pneumonia Maternal Grandmother    Other Maternal Grandfather        esophageal problems   Stroke Paternal Grandmother    Colon cancer Neg Hx    Esophageal cancer Neg Hx     ALLERGIES:  is allergic to albuterol, codeine, hibiclens  [chlorhexidine  gluconate], tramadol , and lexapro [escitalopram oxalate].  MEDICATIONS:  Current Outpatient Medications  Medication Sig Dispense Refill   acetaminophen  (TYLENOL ) 650 MG CR tablet Take 2 tablets by mouth daily as needed.      aspirin 81 MG tablet Take 81 mg by mouth daily.     Azelastine-Fluticasone 137-50 MCG/ACT SUSP Place into the nose.     bisacodyl (DULCOLAX) 5 MG EC tablet Take 5 mg by mouth daily as needed for moderate constipation.     buPROPion (WELLBUTRIN XL) 150 MG 24 hr tablet Take 150 mg by mouth every morning.     Calcium Carb-Cholecalciferol (CALCIUM + VITAMIN D3 PO) Take 600 mg by mouth daily.     cetirizine (ZYRTEC) 10 MG tablet Take 10 mg by mouth daily.     dexamethasone  (DECADRON ) 4 MG tablet Take 2 tablets daily for 2 days, start the day after chemotherapy. Take with food. 30 tablet 1   famotidine  (PEPCID ) 20 MG tablet Take 20 mg by mouth at bedtime.     hydrOXYzine (ATARAX) 25 MG tablet Take 25 mg by mouth 2 (two) times daily as needed.     hyoscyamine  (LEVSIN  SL) 0.125 MG SL tablet Place 1  tablet (0.125 mg total) under the tongue every 8 (eight) hours as needed. 30 tablet 1   lidocaine -prilocaine  (EMLA ) cream Apply to affected area once 30 g 3   linaclotide  (LINZESS ) 290 MCG CAPS capsule Take 1 capsule (290 mcg total) by mouth daily before breakfast. 90 capsule 3   losartan (COZAAR) 50 MG tablet Take 50 mg by mouth daily.     meloxicam (MOBIC) 15 MG tablet Take 15 mg by mouth daily.     metaxalone (SKELAXIN) 800 MG tablet Take 800 mg by mouth 3 (three)  times daily.     metFORMIN (GLUCOPHAGE) 500 MG tablet Take 500 mg by mouth 2 (two) times daily with a meal.     Multiple Vitamins-Minerals (ONE-A-DAY WOMENS 50 PLUS PO) Take 1 capsule by mouth daily.     Omega-3 Fatty Acids (FISH OIL) 1200 MG CAPS Take 1 capsule by mouth daily.     ondansetron  (ZOFRAN ) 8 MG tablet Take 1 tablet (8 mg total) by mouth every 8 (eight) hours as needed for nausea or vomiting. Start on the third day after chemotherapy. 30 tablet 1   oxyCODONE  (ROXICODONE ) 5 MG immediate release tablet Take 1 tablet (5 mg total) by mouth every 6 (six) hours as needed. 5 tablet 0   pantoprazole  (PROTONIX ) 40 MG tablet Take 1 tablet (40 mg total) by mouth daily. (Patient taking differently: Take 40 mg by mouth 2 (two) times daily.) 30 tablet 1   polyethylene glycol powder (GLYCOLAX/MIRALAX) 17 GM/SCOOP powder Take 17 g by mouth daily. Dissolve 1 capful (17g) in 4-8 ounces of liquid and take by mouth daily.     PROBIOTIC PRODUCT PO Take 1 capsule by mouth daily.     prochlorperazine  (COMPAZINE ) 10 MG tablet Take 1 tablet (10 mg total) by mouth every 6 (six) hours as needed for nausea or vomiting. 30 tablet 1   Sodium Sulfate-Mag Sulfate-KCl (SUTAB ) 1479-225-188 MG TABS Use as directed for colonoscopy. MANUFACTURER CODES!! BIN: [id number] PCN: CN GROUP: [id number] MEMBER ID: [id number];MLW AS SECONDARY INSURANCE ;NO PRIOR AUTHORIZATION 24 tablet 0   triamterene-hydrochlorothiazide (MAXZIDE) 75-50 MG per tablet Take 1 tablet by mouth  daily.     ZEPBOUND 12.5 MG/0.5ML Pen Inject 12.5 mg into the skin once a week.     No current facility-administered medications for this visit.     PHYSICAL EXAMINATION: ECOG PERFORMANCE STATUS: 1 - Symptomatic but completely ambulatory  BP 123/61 (BP Location: Left Arm, Patient Position: Sitting)   Pulse 64   Temp 98 F (36.7 C) (Temporal)   Resp 16   Wt 258 lb 12.8 oz (117.4 kg)   SpO2 100%   BMI 44.42 kg/m   GENERAL:alert, no distress and comfortable, obese. LYMPH:  palpable right axillary adenopathy, breast exam deferred today LUNGS: clear to auscultation and percussion with normal breathing effort HEART: regular rate & rhythm and no murmurs and no lower extremity edema ABDOMEN:abdomen soft, non-tender and normal bowel sounds Musculoskeletal:no cyanosis of digits and no clubbing  PSYCH: alert & oriented x 3 with fluent speech NEURO: no focal motor/sensory deficits Port site appears well  LABORATORY DATA:  I have reviewed the data as listed Lab Results  Component Value Date   WBC 5.9 09/24/2024   HGB 11.6 (L) 09/24/2024   HCT 35.4 (L) 09/24/2024   MCV 85.1 09/24/2024   PLT 267 09/24/2024     Chemistry      Component Value Date/Time   NA 136 09/17/2024 0927   K 4.1 09/17/2024 0927   CL 102 09/17/2024 0927   CO2 24 09/17/2024 0927   BUN 17 09/17/2024 0927   CREATININE 0.94 09/17/2024 0927      Component Value Date/Time   CALCIUM 9.6 09/17/2024 0927   ALKPHOS 74 09/17/2024 0927   AST 20 09/17/2024 0927   ALT <5 09/17/2024 0927   BILITOT 0.3 09/17/2024 0927       RADIOGRAPHIC STUDIES: I have personally reviewed the radiological images as listed and agreed with the findings in the report. DG CHEST PORT 1 VIEW Result Date: 09/14/2024 CLINICAL DATA:  Port-A-Cath EXAM: PORTABLE CHEST 1 VIEW COMPARISON:  08/05/2013, PET CT 08/28/2024 FINDINGS: Interim placement of left-sided central venous port with tip projecting over the upper SVC. Hypoventilatory changes  with mild atelectasis and or scarring at left base. No pneumothorax is seen. Cardiac size is normal IMPRESSION: Interim placement of left-sided central venous port with tip projecting over the upper SVC. No pneumothorax. Electronically Signed   By: Luke Bun M.D.   On: 09/14/2024 15:57   DG C-Arm 1-60 Min-No Report Result Date: 09/14/2024 Fluoroscopy was utilized by the requesting physician.  No radiographic interpretation.   ECHOCARDIOGRAM COMPLETE Result Date: 09/04/2024    ECHOCARDIOGRAM REPORT   Patient Name:   LAQUESHIA CIHLAR [facility] Date of Exam: 09/04/2024 Medical Rec #:  [id number]             Height:       64.0 in Accession #:    [id number]            Weight:       253.2 lb Date of Birth:  23-Jan-1966             BSA:          2.163 m Patient Age:    58 years              BP:           138/77 mmHg Patient Gender: F                     HR:           61 bpm. Exam Location:  Outpatient Procedure: 2D Echo, Cardiac Doppler, Color Doppler and Strain Analysis (Both            Spectral and Color Flow Doppler were utilized during procedure). Indications:    CHEMO Z09  History:        Patient has no prior history of Echocardiogram examinations.                 Cancer; Risk Factors:Hypertension and Former Smoker.  Sonographer:    Koleen Popper RDCS Referring Phys: [id number] Marcia Hartwell  Sonographer Comments: Global longitudinal strain was attempted. IMPRESSIONS  1. Left ventricular ejection fraction, by estimation, is 60 to 65%. The left ventricle has normal function. The left ventricle has no regional wall motion abnormalities. Left ventricular diastolic parameters were normal. The average left ventricular global longitudinal strain is -18.9 %. The global longitudinal strain is normal.  2. Right ventricular systolic function is normal. The right ventricular size is normal. There is normal pulmonary artery systolic pressure. The estimated right ventricular systolic pressure is 24.0 mmHg.  3. Left atrial size  was mildly dilated.  4. The mitral valve is normal in structure. Trivial mitral valve regurgitation. No evidence of mitral stenosis.  5. The aortic valve is tricuspid. Aortic valve regurgitation is not visualized. No aortic stenosis is present.  6. The inferior vena cava is dilated in size with >50% respiratory variability, suggesting right atrial pressure of 8 mmHg. FINDINGS  Left Ventricle: Left ventricular ejection fraction, by estimation, is 60 to 65%. The left ventricle has normal function. The left ventricle has no regional wall motion abnormalities. The average left ventricular global longitudinal strain is -18.9 %. Strain was performed and the global longitudinal strain is normal. The left ventricular internal cavity size was normal in size. There is no left ventricular hypertrophy. Left ventricular diastolic parameters were normal. Right Ventricle: The right ventricular size is normal.  No increase in right ventricular wall thickness. Right ventricular systolic function is normal. There is normal pulmonary artery systolic pressure. The tricuspid regurgitant velocity is 2.00 m/s, and  with an assumed right atrial pressure of 8 mmHg, the estimated right ventricular systolic pressure is 24.0 mmHg. Left Atrium: Left atrial size was mildly dilated. Right Atrium: Right atrial size was normal in size. Pericardium: There is no evidence of pericardial effusion. Mitral Valve: The mitral valve is normal in structure. Mild mitral annular calcification. Trivial mitral valve regurgitation. No evidence of mitral valve stenosis. Tricuspid Valve: The tricuspid valve is normal in structure. Tricuspid valve regurgitation is trivial. No evidence of tricuspid stenosis. Aortic Valve: The aortic valve is tricuspid. Aortic valve regurgitation is not visualized. No aortic stenosis is present. Pulmonic Valve: The pulmonic valve was normal in structure. Pulmonic valve regurgitation is trivial. No evidence of pulmonic stenosis. Aorta:  The aortic root is normal in size and structure. Venous: The inferior vena cava is dilated in size with greater than 50% respiratory variability, suggesting right atrial pressure of 8 mmHg. IAS/Shunts: No atrial level shunt detected by color flow Doppler.  LEFT VENTRICLE PLAX 2D LVIDd:         4.80 cm   Diastology LVIDs:         2.70 cm   LV e' medial:    7.77 cm/s LV PW:         0.80 cm   LV E/e' medial:  11.4 LV IVS:        1.20 cm   LV e' lateral:   12.00 cm/s LVOT diam:     2.20 cm   LV E/e' lateral: 7.4 LV SV:         95 LV SV Index:   44        2D Longitudinal Strain LVOT Area:     3.80 cm  2D Strain GLS Avg:     -18.9 %  RIGHT VENTRICLE             IVC RV Basal diam:  3.80 cm     IVC diam: 2.50 cm RV S prime:     15.70 cm/s TAPSE (M-mode): 2.0 cm LEFT ATRIUM             Index        RIGHT ATRIUM           Index LA diam:        4.30 cm 1.99 cm/m   RA Area:     12.10 cm LA Vol (A2C):   51.0 ml 23.58 ml/m  RA Volume:   25.30 ml  11.70 ml/m LA Vol (A4C):   40.0 ml 18.50 ml/m LA Biplane Vol: 45.4 ml 20.99 ml/m  AORTIC VALVE LVOT Vmax:   110.00 cm/s LVOT Vmean:  69.000 cm/s LVOT VTI:    0.249 m  AORTA Ao Root diam: 3.10 cm Ao Asc diam:  3.30 cm MITRAL VALVE               TRICUSPID VALVE MV Area (PHT): 3.60 cm    TR Peak grad:   16.0 mmHg MV Decel Time: 211 msec    TR Vmax:        200.00 cm/s MR Peak grad: 69.4 mmHg MR Vmax:      416.50 cm/s  SHUNTS MV E velocity: 88.90 cm/s  Systemic VTI:  0.25 m MV A velocity: 72.70 cm/s  Systemic Diam: 2.20 cm MV E/A ratio:  1.22 Mihai Croitoru MD  Electronically signed by Jerel Balding MD Signature Date/Time: 09/04/2024/9:18:09 AM    Final    MR BREAST BILATERAL W WO CONTRAST INC CAD Result Date: 09/01/2024 CLINICAL DATA:  58 year old female with recent diagnosis metastatic right breast cancer. MRI for disease extent. EXAM: BILATERAL BREAST MRI WITH AND WITHOUT CONTRAST TECHNIQUE: Multiplanar, multisequence MR images of both breasts were obtained prior to and  following the intravenous administration of 10 ml of Three-dimensional MR images were rendered by post-processing of the original MR data on an independent workstation. The three-dimensional MR images were interpreted, and findings are reported in the following complete MRI report for this study. Three dimensional images were evaluated at the independent interpreting workstation using the DynaCAD thin client. COMPARISON:  Previous exam(s). FINDINGS: Breast composition: a. Almost entirely fat. Background parenchymal enhancement: Mild Right breast: Biopsy clip within a spiculated enhancing mass measuring approximately 2.5 x 2 x 1.9 cm about the lower slightly outer breast, posterior third. Left breast: No mass or abnormal enhancement. Lymph nodes: Biopsy clip within an enlarged right level 1 lymph node measuring up to 1.9 cm. No suspicious lymph nodes on the left. Ancillary findings:  None. IMPRESSION: Biopsy-proven right breast cancer with metastasis to the right axilla as above. RECOMMENDATION: Surgical/oncology consultation with excision when clinically appropriate. BI-RADS CATEGORY  6: Known biopsy-proven malignancy. Electronically Signed   By: Curtistine Noble   On: 09/01/2024 15:17   NM PET Image Initial (PI) Skull Base To Thigh Result Date: 08/31/2024 CLINICAL DATA:  Initial treatment strategy for triple negative breast cancer. Bone pain. EXAM: NUCLEAR MEDICINE PET SKULL BASE TO THIGH TECHNIQUE: 12.61 mCi F-18 FDG was injected intravenously. Full-ring PET imaging was performed from the skull base to thigh after the radiotracer. CT data was obtained and used for attenuation correction and anatomic localization. Fasting blood glucose: 90 mg/dl COMPARISON:  Abdominopelvic CT 11/05/2018. Recent breast imaging studies. FINDINGS: Mediastinal blood pool activity: SUV max 3.2 NECK: No hypermetabolic cervical lymph nodes are identified. No suspicious activity identified within the pharyngeal mucosal space.  Incidental CT findings: Left maxillary sinus mucous retention cyst. CHEST: Lobulated mass centrally in the right breast containing a biopsy clip and measuring 2.3 cm on image 79/4 is markedly hypermetabolic with an SUV max of 22.1. There is a hypermetabolic right axillary lymph node measuring 1.9 x 1.4 cm on image 65/4 (SUV max 27.6). There is a small hypermetabolic right internal mammary lymph node measuring 6 mm short axis on image 66/4 (SUV max 7.6). Probable additional smaller hypermetabolic internal mammary node more inferiorly on the right (image 71/4). No hypermetabolic mediastinal or left axillary adenopathy. No hypermetabolic pulmonary activity or suspicious nodularity. Incidental CT findings: none ABDOMEN/PELVIS: There is no hypermetabolic activity within the liver, adrenal glands, spleen or pancreas. There is no hypermetabolic nodal activity in the abdomen or pelvis. Bowel activity within physiologic limits. Incidental CT findings: Postsurgical changes at the gastroesophageal junction. Previous hysterectomy. Mild aortic atherosclerosis. SKELETON: There is no hypermetabolic activity to suggest osseous metastatic disease. Minimally heterogeneous marrow activity, within physiologic limits. Incidental CT findings: Mild spondylosis. IMPRESSION: 1. Hypermetabolic right breast mass consistent with known breast cancer. 2. Hypermetabolic right axillary and right internal mammary lymph nodes consistent with nodal metastases. 3. No evidence of distant metastatic disease. 4.  Aortic Atherosclerosis (ICD10-I70.0). Electronically Signed   By: Elsie Perone M.D.   On: 08/31/2024 15:19    All questions were answered. The patient knows to call the clinic with any problems, questions or concerns. I spent 30 minutes  in the care of this patient including H and P, review of records, counseling and coordination of care.     Amber Stalls, MD 09/24/2024 10:04 AM

## 2024-09-24 NOTE — Progress Notes (Signed)
 Per Dr. Loretha, OK to start premedications with CMP pending today.

## 2024-09-24 NOTE — Patient Instructions (Signed)
 [facility] - A DEPT OF [facility]  Discharge Instructions: Thank you for choosing [facility] to provide your oncology and hematology care.   If you have a lab appointment with the Cancer Center, please go directly to the Cancer Center and check in at the registration area.   Wear comfortable clothing and clothing appropriate for easy access to any Portacath or PICC line.   We strive to give you quality time with your provider. You may need to reschedule your appointment if you arrive late (15 or more minutes).  Arriving late affects you and other patients whose appointments are after yours.  Also, if you miss three or more appointments without notifying the office, you may be dismissed from the clinic at the provider's discretion.      For prescription refill requests, have your pharmacy contact our office and allow 72 hours for refills to be completed.    Today you received the following chemotherapy and/or immunotherapy agents: Paclitaxel , Carboplatin .       To help prevent nausea and vomiting after your treatment, we encourage you to take your nausea medication as directed.  BELOW ARE SYMPTOMS THAT SHOULD BE REPORTED IMMEDIATELY: *FEVER GREATER THAN 100.4 F (38 C) OR HIGHER *CHILLS OR SWEATING *NAUSEA AND VOMITING THAT IS NOT CONTROLLED WITH YOUR NAUSEA MEDICATION *UNUSUAL SHORTNESS OF BREATH *UNUSUAL BRUISING OR BLEEDING *URINARY PROBLEMS (pain or burning when urinating, or frequent urination) *BOWEL PROBLEMS (unusual diarrhea, constipation, pain near the anus) TENDERNESS IN MOUTH AND THROAT WITH OR WITHOUT PRESENCE OF ULCERS (sore throat, sores in mouth, or a toothache) UNUSUAL RASH, SWELLING OR PAIN  UNUSUAL VAGINAL DISCHARGE OR ITCHING   Items with * indicate a potential emergency and should be followed up as soon as possible or go to the Emergency Department if any problems should occur.  Please show the CHEMOTHERAPY ALERT CARD  or IMMUNOTHERAPY ALERT CARD at check-in to the Emergency Department and triage nurse.  Should you have questions after your visit or need to cancel or reschedule your appointment, please contact [facility] - A DEPT OF [facility]  Dept: [phone]  and follow the prompts.  Office hours are 8:00 a.m. to 4:30 p.m. Monday - Friday. Please note that voicemails left after 4:00 p.m. may not be returned until the following business day.  We are closed weekends and major holidays. You have access to a nurse at all times for urgent questions. Please call the main number to the clinic Dept: [phone] and follow the prompts.   For any non-urgent questions, you may also contact your provider using MyChart. We now offer e-Visits for anyone 64 and older to request care online for non-urgent symptoms. For details visit [email or web address].   Also download the MyChart app! Go to the app store, search MyChart, open the app, select [employer], and log in with your MyChart username and password.

## 2024-09-25 ENCOUNTER — Telehealth: Payer: Self-pay | Admitting: Pharmacist

## 2024-09-25 ENCOUNTER — Other Ambulatory Visit: Payer: Self-pay

## 2024-09-25 NOTE — Telephone Encounter (Signed)
 [facility]        Telephone: [phone]?Fax: [phone]   Oncology Clinical Pharmacist Practitioner Progress Note   Alicia Bentley is a 58 y.o. female with a diagnosis of breast cancer currently on Neoadjuvant Keynote 522 regimen under the care of Dr. Amber Stalls.   I connected with [name] today by telephone and verified that I was speaking with the correct person using two patient identifiers. I discussed the limitations, risks, security and privacy concerns of performing an evaluation and management service by telemedicine and the availability of in-person appointments. The patient/caregiver expressed understanding and agreed to proceed.  Other persons participating in the visit and their role in the encounter: none   Patients location: home  Providers location: clinic  Clinical pharmacy reached out to the patient today to discuss her proton pump inhibitor and her histamine blocker that she is taking for reflux.  We had notified Dr. Stalls of the potential interaction with pembrolizumab  and her PPI, pantoprazole , where pembrolizumab  may not work as well.  Dr. Stalls had recommended that the patient try pantoprazole  once daily while continuing famotidine  twice daily.  The patient states that her throat soreness appears to have gotten worse since her surgery date 09/14/24 when she states she was intubated.  However, she reports struggling with reflux for a long time and then it recently became worse when she started Zepbound earlier in the year.  She states this is when she was put on twice daily famotidine .  After discussing some different options, the patient would like to continue once daily PPI usage until she sees Irene Thayil on 09/30/24 prior to her next chemotherapy treatment with pembrolizumab .  If her reflux and sore throat are still present, she would like to increase her PPI use back to twice daily dosing. We felt this to be a reasonable  plan.  This information was shared with Dr. Stalls Lis, and Dr. Albertus.  [name] participated in the discussion, expressed understanding, and voiced agreement with the above plan. All questions were answered to her satisfaction. The patient was advised to contact the clinic at [phone] with any questions or concerns prior to her return visit.  Clinical pharmacy will continue to support Betty Johnson Grable and Dr. Praveena Iruku as needed.  Chiyoko Torrico A. Lucila, PharmD, BCOP, CPP  Norleen DELENA Lucila, RPH-CPP,  09/25/2024  9:18 AM   **Disclaimer: This note was dictated with voice recognition software. Similar sounding words can inadvertently be transcribed and this note may contain transcription errors which may not have been corrected upon publication of note.**

## 2024-09-30 ENCOUNTER — Other Ambulatory Visit: Payer: Self-pay | Admitting: Pharmacist

## 2024-09-30 ENCOUNTER — Encounter: Payer: Self-pay | Admitting: Physician Assistant

## 2024-09-30 ENCOUNTER — Inpatient Hospital Stay: Admitting: Physician Assistant

## 2024-09-30 ENCOUNTER — Other Ambulatory Visit: Payer: Self-pay | Admitting: Hematology and Oncology

## 2024-09-30 ENCOUNTER — Inpatient Hospital Stay

## 2024-09-30 VITALS — BP 104/68 | HR 59 | Temp 97.7°F | Resp 18 | Wt 262.8 lb

## 2024-09-30 DIAGNOSIS — Z170 Estrogen receptor positive status [ER+]: Secondary | ICD-10-CM | POA: Diagnosis not present

## 2024-09-30 DIAGNOSIS — G620 Drug-induced polyneuropathy: Secondary | ICD-10-CM

## 2024-09-30 DIAGNOSIS — C50511 Malignant neoplasm of lower-outer quadrant of right female breast: Secondary | ICD-10-CM | POA: Diagnosis not present

## 2024-09-30 DIAGNOSIS — K219 Gastro-esophageal reflux disease without esophagitis: Secondary | ICD-10-CM

## 2024-09-30 DIAGNOSIS — Z5111 Encounter for antineoplastic chemotherapy: Secondary | ICD-10-CM | POA: Diagnosis not present

## 2024-09-30 LAB — CMP (CANCER CENTER ONLY)
ALT: <5 U/L (ref 0–44)
AST: 16 U/L (ref 15–41)
Albumin: 4.2 g/dL (ref 3.5–5.0)
Alkaline Phosphatase: 72 U/L (ref 38–126)
Anion gap: 10 (ref 5–15)
BUN: 16 mg/dL (ref 6–20)
CO2: 26 mmol/L (ref 22–32)
Calcium: 9.3 mg/dL (ref 8.9–10.3)
Chloride: 101 mmol/L (ref 98–111)
Creatinine: 0.75 mg/dL (ref 0.44–1.00)
GFR, Estimated: >60 mL/min
Glucose, Bld: 89 mg/dL (ref 70–99)
Potassium: 4 mmol/L (ref 3.5–5.1)
Sodium: 137 mmol/L (ref 135–145)
Total Bilirubin: 0.4 mg/dL (ref 0.0–1.2)
Total Protein: 6.6 g/dL (ref 6.5–8.1)

## 2024-09-30 LAB — CBC WITH DIFFERENTIAL (CANCER CENTER ONLY)
Abs Immature Granulocytes: 0.02 K/uL (ref 0.00–0.07)
Basophils Absolute: 0 K/uL (ref 0.0–0.1)
Basophils Relative: 1 %
Eosinophils Absolute: 0.1 K/uL (ref 0.0–0.5)
Eosinophils Relative: 2 %
HCT: 34.8 % — ABNORMAL LOW (ref 36.0–46.0)
Hemoglobin: 11.5 g/dL — ABNORMAL LOW (ref 12.0–15.0)
Immature Granulocytes: 0 %
Lymphocytes Relative: 30 %
Lymphs Abs: 1.6 K/uL (ref 0.7–4.0)
MCH: 28.2 pg (ref 26.0–34.0)
MCHC: 33 g/dL (ref 30.0–36.0)
MCV: 85.3 fL (ref 80.0–100.0)
Monocytes Absolute: 0.2 K/uL (ref 0.1–1.0)
Monocytes Relative: 3 %
Neutro Abs: 3.4 K/uL (ref 1.7–7.7)
Neutrophils Relative %: 64 %
Platelet Count: 284 K/uL (ref 150–400)
RBC: 4.08 MIL/uL (ref 3.87–5.11)
RDW: 14 % (ref 11.5–15.5)
WBC Count: 5.4 K/uL (ref 4.0–10.5)
nRBC: 0 % (ref 0.0–0.2)

## 2024-09-30 MED ORDER — SUCRALFATE 1 GM/10ML PO SUSP: 1.0000 g | Freq: Three times a day (TID) | ORAL | 0 refills | Status: DC

## 2024-09-30 NOTE — Progress Notes (Signed)
 " [facility] Telephone:([phone]   [phone]  PROGRESS NOTE  Patient Care Team: [name], MD as PCP - General (Family Medicine) Tyree Nanetta SAILOR, RN as Oncology Nurse Navigator Curvin Deward MOULD, MD as Consulting Physician (General Surgery) Loretha Ash, MD as Consulting Physician (Hematology and Oncology) Maritza Stagger, MD as Consulting Physician (Radiation Oncology)  CHIEF COMPLAINTS/PURPOSE OF CONSULTATION:  Right breast cancer  Oncology History  Malignant neoplasm of lower-outer quadrant of right breast of female, estrogen receptor positive (HCC)  08/21/2024 Initial Diagnosis   Malignant neoplasm of lower-outer quadrant of right breast of female, estrogen receptor positive (HCC)   08/26/2024 Cancer Staging   Staging form: Breast, AJCC 8th Edition - Clinical: Stage IIB (cT1c, cN1, cM0, G3, ER-, PR-, HER2-) - Signed by Loretha Ash, MD on 08/26/2024 Stage prefix: Initial diagnosis Histologic grading system: 3 grade system   09/05/2024 Genetic Testing   Negative [facility] CancerNext- Expanded + RNA insight. Report date 09/05/2024.  [facility] CancerNext-Expanded + RNAinsight gene panel includes sequencing, rearrangement, and RNA analysis for the following 77 genes: AIP, ALK, APC, ATM, AXIN2, BAP1, BARD1, BMPR1A, BRCA1, BRCA2, BRIP1, CDC73, CDH1, CDK4, CDKN1B, CDKN2A, CEBPA, CHEK2, CTNNA1, DDX41, DICER1, ETV6, FH, FLCN, GATA2, LZTR1, MAX, MBD4, MEN1, MET, MLH1, MSH2, MSH3, MSH6, MUTYH, NF1, NF2, NTHL1, PALB2, PHOX2B, PMS2, POT1, PRKAR1A, PTCH1, PTEN, RAD51C, RAD51D, RB1, RET, RPS20, RUNX1, SDHA, SDHAF2, SDHB, SDHC, SDHD, SMAD4, SMARCA4, SMARCB1, SMARCE1, STK11, SUFU, TMEM127, TP53, TSC1, TSC2, VHL, and WT1 (sequencing and deletion/duplication); EGFR, HOXB13, KIT, MITF, PDGFRA, POLD1, and POLE (sequencing only); EPCAM and GREM1 (deletion/duplication only).     09/17/2024 -  Chemotherapy   Patient is on Treatment Plan : BREAST Pembrolizumab  (200) D1 +  Carboplatin  (1.5) D1,8,15 + Paclitaxel  (80) D1,8,15 q21d X 4 cycles / Pembrolizumab  (200) D1 + AC D1 q21d x 4 cycles      CURRENT TREATMENT: Carboplatin /Paclitaxel /Keytruda   INTERVAL HISTORY:  Alicia Bentley 58 y.o. female returns for a toxicity check prior to Cycle 1, Day 15 of carboplatin  and paclitaxel . She was last seen by Dr. Loretha on 09/24/2024. She is unaccompanied for this visit.   On exam today, Ms. Drab reports her energy levels are overall stable since starting treatment.  She is able to complete her ADLs on her own.  Her appetite is unchanged and weight is stable.  She does have ongoing acid reflux and a persistent sore throat.  She is currently taking famotidine  twice daily and Protonix  once daily with residual symptoms.  Patient reports having noticeable neuropathy with numbness in her feet greater than fingertips.  She reports her neuropathy has worsened after the second treatment lasting several days.  She denies any interference with grip or balance. She has mild nausea without any vomiting episodes.  She denies fevers, chills, night sweats, shortness of breath, chest pain or cough.  She has no other complaints.  Rest of the 10 point ROS as below.  MEDICAL HISTORY:  Past Medical History:  Diagnosis Date   Cystitis, interstitial    Diverticulosis of colon (without mention of hemorrhage)    DJD (degenerative joint disease)    Family history of brain tumor    Family history of breast cancer    Family history of colon cancer    Family history of liver cancer    Family history of melanoma    Family history of non-Hodgkin lymphoma    Fatty liver    GERD (gastroesophageal reflux disease)    Headache(784.0)    Hiatal  hernia    Internal hemorrhoids    Irritable bowel syndrome    Miscarriage    Panic disorder    PONV (postoperative nausea and vomiting)    Tubular adenoma of colon 2010   Unspecified essential hypertension    Unspecified urinary incontinence      SURGICAL HISTORY: Past Surgical History:  Procedure Laterality Date   ABDOMINAL HYSTERECTOMY     BREAST BIOPSY Right 08/17/2024   US  RT BREAST BX W LOC DEV 1ST LESION IMG BX SPEC US  GUIDE 08/17/2024 [facility] MAMMOGRAPHY   COLONOSCOPY N/A 12/29/2012   Procedure: COLONOSCOPY;  Surgeon: Alm JONELLE Gander, MD;  Location: [facility];  Service: Endoscopy;  Laterality: N/A;   deviated septrum     NASAL SINUS SURGERY     NISSEN FUNDOPLICATION     PORTACATH PLACEMENT Left 09/14/2024   Procedure: INSERTION, TUNNELED CENTRAL VENOUS DEVICE, WITH PORT;  Surgeon: Curvin Deward MOULD, MD;  Location: [facility] OR;  Service: General;  Laterality: Left;  WITH ULTRASOUND GUIDANCE   SKIN GRAFT     right ear   TYMPANOSTOMY TUBE PLACEMENT     x 10    SOCIAL HISTORY: Social History   Socioeconomic History   Marital status: Married    Spouse name: Not on file   Number of children: 0   Years of education: Not on file   Highest education level: Not on file  Occupational History   Occupation: housewife  Tobacco Use   Smoking status: Former    Types: Cigarettes   Smokeless tobacco: Never   Tobacco comments:    college years  Vaping Use   Vaping status: Never Used  Substance and Sexual Activity   Alcohol use: No   Drug use: No   Sexual activity: Not on file  Other Topics Concern   Not on file  Social History Narrative   Adopted daughter   Social Drivers of Health   Tobacco Use: Medium Risk (09/10/2024)   Patient History    Smoking Tobacco Use: Former    Smokeless Tobacco Use: Never    Passive Exposure: Not on Actuary Strain: Not on file  Food Insecurity: No Food Insecurity (08/26/2024)   Epic    Worried About Programme Researcher, Broadcasting/film/video in the Last Year: Never true    Ran Out of Food in the Last Year: Never true  Transportation Needs: No Transportation Needs (08/26/2024)   Epic    Lack of Transportation (Medical): No    Lack of Transportation (Non-Medical): No  Physical Activity:  Not on file  Stress: Not on file  Social Connections: Not on file  Intimate Partner Violence: Not At Risk (08/26/2024)   Epic    Fear of Current or Ex-Partner: No    Emotionally Abused: No    Physically Abused: No    Sexually Abused: No  Depression (PHQ2-9): Low Risk (08/26/2024)   Depression (PHQ2-9)    PHQ-2 Score: 0  Alcohol Screen: Not on file  Housing: Low Risk (08/26/2024)   Epic    Unable to Pay for Housing in the Last Year: No    Number of Times Moved in the Last Year: 0    Homeless in the Last Year: No  Utilities: Not At Risk (08/26/2024)   Epic    Threatened with loss of utilities: No  Health Literacy: Low Risk (03/28/2022)   [facility]   Health Literacy    : Never    FAMILY HISTORY: Family History  Problem Relation Age of Onset   Heart disease Mother    Non-Hodgkin's lymphoma Mother    Stroke Mother    Hypertension Mother    Deep vein thrombosis Mother    Diabetes Father    Heart disease Father    Dementia Father    Stroke Father    Melanoma Father        mets to lymph nodes   Hypertension Father    Pancreatic cancer Paternal Uncle    Stomach cancer Paternal Uncle        mets from pancreas   Pneumonia Maternal Grandmother    Other Maternal Grandfather        esophageal problems   Stroke Paternal Grandmother    Colon cancer Neg Hx    Esophageal cancer Neg Hx     ALLERGIES:  is allergic to codeine, hibiclens  [chlorhexidine  gluconate], hydrocodone -acetaminophen , oxycodone , tramadol , tramadol  hcl, albuterol, and lexapro [escitalopram oxalate].  MEDICATIONS:  Current Outpatient Medications  Medication Sig Dispense Refill   acetaminophen  (TYLENOL ) 650 MG CR tablet Take 2 tablets by mouth daily as needed.      aspirin 81 MG tablet Take 81 mg by mouth daily.     Azelastine-Fluticasone 137-50 MCG/ACT SUSP Place into the nose.     bisacodyl (DULCOLAX) 5 MG EC tablet Take 5 mg by mouth daily as needed for moderate constipation.      buPROPion (WELLBUTRIN XL) 150 MG 24 hr tablet Take 150 mg by mouth every morning.     Calcium Carb-Cholecalciferol (CALCIUM + VITAMIN D3 PO) Take 600 mg by mouth daily.     cetirizine (ZYRTEC) 10 MG tablet Take 10 mg by mouth daily.     famotidine  (PEPCID ) 20 MG tablet Take 20 mg by mouth at bedtime. (Patient taking differently: Take 20 mg by mouth 2 (two) times daily.)     hydrOXYzine (ATARAX) 25 MG tablet Take 25 mg by mouth 2 (two) times daily as needed.     hyoscyamine  (LEVSIN  SL) 0.125 MG SL tablet Place 1 tablet (0.125 mg total) under the tongue every 8 (eight) hours as needed. 30 tablet 1   lidocaine -prilocaine  (EMLA ) cream Apply to affected area once 30 g 3   linaclotide  (LINZESS ) 290 MCG CAPS capsule Take 1 capsule (290 mcg total) by mouth daily before breakfast. 90 capsule 3   losartan (COZAAR) 50 MG tablet Take 50 mg by mouth daily.     meloxicam (MOBIC) 15 MG tablet Take 15 mg by mouth daily.     metaxalone (SKELAXIN) 800 MG tablet Take 800 mg by mouth 3 (three) times daily.     metFORMIN (GLUCOPHAGE) 500 MG tablet Take 500 mg by mouth 2 (two) times daily with a meal.     Multiple Vitamins-Minerals (ONE-A-DAY WOMENS 50 PLUS PO) Take 1 capsule by mouth daily.     Omega-3 Fatty Acids (FISH OIL) 1200 MG CAPS Take 1 capsule by mouth daily.     ondansetron  (ZOFRAN ) 8 MG tablet Take 1 tablet (8 mg total) by mouth every 8 (eight) hours as needed for nausea or vomiting. Start on the third day after chemotherapy. 30 tablet 1   pantoprazole  (PROTONIX ) 40 MG tablet Take 1 tablet (40 mg total) by mouth daily. (Patient taking differently: Take 40 mg by mouth 2 (two) times daily.) 30 tablet 1   polyethylene glycol powder (GLYCOLAX/MIRALAX) 17 GM/SCOOP powder Take 17 g by mouth daily. Dissolve 1 capful (17g) in 4-8 ounces of liquid and take by mouth daily.  PROBIOTIC PRODUCT PO Take 1 capsule by mouth daily.     prochlorperazine  (COMPAZINE ) 10 MG tablet Take 1 tablet (10 mg total) by mouth every 6  (six) hours as needed for nausea or vomiting. 30 tablet 1   triamterene-hydrochlorothiazide (MAXZIDE) 75-50 MG per tablet Take 1 tablet by mouth daily.     dexamethasone  (DECADRON ) 4 MG tablet Take 2 tablets daily for 2 days, start the day after chemotherapy. Take with food. (Patient not taking: Reported on 09/30/2024) 30 tablet 1   oxyCODONE  (ROXICODONE ) 5 MG immediate release tablet Take 1 tablet (5 mg total) by mouth every 6 (six) hours as needed. (Patient not taking: Reported on 09/30/2024) 5 tablet 0   Sodium Sulfate-Mag Sulfate-KCl (SUTAB ) 1479-225-188 MG TABS Use as directed for colonoscopy. MANUFACTURER CODES!! BIN: [id number] PCN: CN GROUP: [id number] MEMBER ID: [id number];MLW AS SECONDARY INSURANCE ;NO PRIOR AUTHORIZATION (Patient not taking: Reported on 09/30/2024) 24 tablet 0   ZEPBOUND 12.5 MG/0.5ML Pen Inject 12.5 mg into the skin once a week. (Patient not taking: Reported on 09/30/2024)     No current facility-administered medications for this visit.    REVIEW OF SYSTEMS:   Constitutional: ( - ) fevers, ( - )  chills , ( - ) night sweats Eyes: ( - ) blurriness of vision, ( - ) double vision, ( - ) watery eyes Ears, nose, mouth, throat, and face: ( - ) mucositis, ( - ) sore throat Respiratory: ( - ) cough, ( - ) dyspnea, ( - ) wheezes Cardiovascular: ( - ) palpitation, ( - ) chest discomfort, ( - ) lower extremity swelling Gastrointestinal:  ( - ) nausea, ( - ) heartburn, ( - ) change in bowel habits Skin: ( - ) abnormal skin rashes Lymphatics: ( - ) new lymphadenopathy, ( - ) easy bruising Neurological: ( - ) numbness, ( - ) tingling, ( - ) new weaknesses Behavioral/Psych: ( - ) mood change, ( - ) new changes  All other systems were reviewed with the patient and are negative.  PHYSICAL EXAMINATION: ECOG PERFORMANCE STATUS: 1 - Symptomatic but completely ambulatory  Vitals:   09/30/24 1110  BP: 104/68  Pulse: (!) 59  Resp: 18  Temp: 97.7 F (36.5 C)  SpO2: 99%   Filed  Weights   09/30/24 1110  Weight: 262 lb 12.8 oz (119.2 kg)    GENERAL: well appearing female in NAD  SKIN: skin color, texture, turgor are normal, no rashes or significant lesions EYES: conjunctiva are pink and non-injected, sclera clear OROPHARYNX: no exudate, no erythema; lips, buccal mucosa, and tongue normal  LUNGS: clear to auscultation and percussion with normal breathing effort HEART: regular rate & rhythm and no murmurs and no lower extremity edema Musculoskeletal: no cyanosis of digits and no clubbing  PSYCH: alert & oriented x 3, fluent speech NEURO: no focal motor/sensory deficits  LABORATORY DATA:  I have reviewed the data as listed    Latest Ref Rng & Units 09/30/2024   10:15 AM 09/24/2024    9:25 AM 09/17/2024    9:27 AM  CBC  WBC 4.0 - 10.5 K/uL 5.4  5.9  6.4   Hemoglobin 12.0 - 15.0 g/dL 88.4  88.3  87.8   Hematocrit 36.0 - 46.0 % 34.8  35.4  37.1   Platelets 150 - 400 K/uL 284  267  291        Latest Ref Rng & Units 09/30/2024   10:15 AM 09/24/2024    9:25 AM 09/17/2024  9:27 AM  CMP  Glucose 70 - 99 mg/dL 89  84  96   BUN 6 - 20 mg/dL 16  16  17    Creatinine 0.44 - 1.00 mg/dL 9.24  9.09  9.05   Sodium 135 - 145 mmol/L 137  136  136   Potassium 3.5 - 5.1 mmol/L 4.0  3.8  4.1   Chloride 98 - 111 mmol/L 101  100  102   CO2 22 - 32 mmol/L 26  26  24    Calcium 8.9 - 10.3 mg/dL 9.3  9.9  9.6   Total Protein 6.5 - 8.1 g/dL 6.6  6.6  6.9   Total Bilirubin 0.0 - 1.2 mg/dL 0.4  0.5  0.3   Alkaline Phos 38 - 126 U/L 72  76  74   AST 15 - 41 U/L 16  16  20    ALT 0 - 44 U/L <5  <5  <5     RADIOGRAPHIC STUDIES: I have personally reviewed the radiological images as listed and agreed with the findings in the report. DG CHEST PORT 1 VIEW Result Date: 09/14/2024 CLINICAL DATA:  Port-A-Cath EXAM: PORTABLE CHEST 1 VIEW COMPARISON:  08/05/2013, PET CT 08/28/2024 FINDINGS: Interim placement of left-sided central venous port with tip projecting over the upper SVC.  Hypoventilatory changes with mild atelectasis and or scarring at left base. No pneumothorax is seen. Cardiac size is normal IMPRESSION: Interim placement of left-sided central venous port with tip projecting over the upper SVC. No pneumothorax. Electronically Signed   By: Luke Bun M.D.   On: 09/14/2024 15:57   DG C-Arm 1-60 Min-No Report Result Date: 09/14/2024 Fluoroscopy was utilized by the requesting physician.  No radiographic interpretation.   ECHOCARDIOGRAM COMPLETE Result Date: 09/04/2024    ECHOCARDIOGRAM REPORT   Patient Name:   Alicia Bentley [facility] Date of Exam: 09/04/2024 Medical Rec #:  [id number]             Height:       64.0 in Accession #:    [id number]            Weight:       253.2 lb Date of Birth:  1966-08-28             BSA:          2.163 m Patient Age:    58 years              BP:           138/77 mmHg Patient Gender: F                     HR:           61 bpm. Exam Location:  Outpatient Procedure: 2D Echo, Cardiac Doppler, Color Doppler and Strain Analysis (Both            Spectral and Color Flow Doppler were utilized during procedure). Indications:    CHEMO Z09  History:        Patient has no prior history of Echocardiogram examinations.                 Cancer; Risk Factors:Hypertension and Former Smoker.  Sonographer:    Koleen Popper RDCS Referring Phys: [id number] PRAVEENA IRUKU  Sonographer Comments: Global longitudinal strain was attempted. IMPRESSIONS  1. Left ventricular ejection fraction, by estimation, is 60 to 65%. The left ventricle has normal function. The left ventricle has no regional wall motion abnormalities. Left  ventricular diastolic parameters were normal. The average left ventricular global longitudinal strain is -18.9 %. The global longitudinal strain is normal.  2. Right ventricular systolic function is normal. The right ventricular size is normal. There is normal pulmonary artery systolic pressure. The estimated right ventricular systolic pressure is 24.0  mmHg.  3. Left atrial size was mildly dilated.  4. The mitral valve is normal in structure. Trivial mitral valve regurgitation. No evidence of mitral stenosis.  5. The aortic valve is tricuspid. Aortic valve regurgitation is not visualized. No aortic stenosis is present.  6. The inferior vena cava is dilated in size with >50% respiratory variability, suggesting right atrial pressure of 8 mmHg. FINDINGS  Left Ventricle: Left ventricular ejection fraction, by estimation, is 60 to 65%. The left ventricle has normal function. The left ventricle has no regional wall motion abnormalities. The average left ventricular global longitudinal strain is -18.9 %. Strain was performed and the global longitudinal strain is normal. The left ventricular internal cavity size was normal in size. There is no left ventricular hypertrophy. Left ventricular diastolic parameters were normal. Right Ventricle: The right ventricular size is normal. No increase in right ventricular wall thickness. Right ventricular systolic function is normal. There is normal pulmonary artery systolic pressure. The tricuspid regurgitant velocity is 2.00 m/s, and  with an assumed right atrial pressure of 8 mmHg, the estimated right ventricular systolic pressure is 24.0 mmHg. Left Atrium: Left atrial size was mildly dilated. Right Atrium: Right atrial size was normal in size. Pericardium: There is no evidence of pericardial effusion. Mitral Valve: The mitral valve is normal in structure. Mild mitral annular calcification. Trivial mitral valve regurgitation. No evidence of mitral valve stenosis. Tricuspid Valve: The tricuspid valve is normal in structure. Tricuspid valve regurgitation is trivial. No evidence of tricuspid stenosis. Aortic Valve: The aortic valve is tricuspid. Aortic valve regurgitation is not visualized. No aortic stenosis is present. Pulmonic Valve: The pulmonic valve was normal in structure. Pulmonic valve regurgitation is trivial. No evidence of  pulmonic stenosis. Aorta: The aortic root is normal in size and structure. Venous: The inferior vena cava is dilated in size with greater than 50% respiratory variability, suggesting right atrial pressure of 8 mmHg. IAS/Shunts: No atrial level shunt detected by color flow Doppler.  LEFT VENTRICLE PLAX 2D LVIDd:         4.80 cm   Diastology LVIDs:         2.70 cm   LV e' medial:    7.77 cm/s LV PW:         0.80 cm   LV E/e' medial:  11.4 LV IVS:        1.20 cm   LV e' lateral:   12.00 cm/s LVOT diam:     2.20 cm   LV E/e' lateral: 7.4 LV SV:         95 LV SV Index:   44        2D Longitudinal Strain LVOT Area:     3.80 cm  2D Strain GLS Avg:     -18.9 %  RIGHT VENTRICLE             IVC RV Basal diam:  3.80 cm     IVC diam: 2.50 cm RV S prime:     15.70 cm/s TAPSE (M-mode): 2.0 cm LEFT ATRIUM             Index        RIGHT ATRIUM  Index LA diam:        4.30 cm 1.99 cm/m   RA Area:     12.10 cm LA Vol (A2C):   51.0 ml 23.58 ml/m  RA Volume:   25.30 ml  11.70 ml/m LA Vol (A4C):   40.0 ml 18.50 ml/m LA Biplane Vol: 45.4 ml 20.99 ml/m  AORTIC VALVE LVOT Vmax:   110.00 cm/s LVOT Vmean:  69.000 cm/s LVOT VTI:    0.249 m  AORTA Ao Root diam: 3.10 cm Ao Asc diam:  3.30 cm MITRAL VALVE               TRICUSPID VALVE MV Area (PHT): 3.60 cm    TR Peak grad:   16.0 mmHg MV Decel Time: 211 msec    TR Vmax:        200.00 cm/s MR Peak grad: 69.4 mmHg MR Vmax:      416.50 cm/s  SHUNTS MV E velocity: 88.90 cm/s  Systemic VTI:  0.25 m MV A velocity: 72.70 cm/s  Systemic Diam: 2.20 cm MV E/A ratio:  1.22 Mihai Croitoru MD Electronically signed by Jerel Balding MD Signature Date/Time: 09/04/2024/9:18:09 AM    Final    MR BREAST BILATERAL W WO CONTRAST INC CAD Result Date: 09/01/2024 CLINICAL DATA:  58 year old female with recent diagnosis metastatic right breast cancer. MRI for disease extent. EXAM: BILATERAL BREAST MRI WITH AND WITHOUT CONTRAST TECHNIQUE: Multiplanar, multisequence MR images of both breasts were  obtained prior to and following the intravenous administration of 10 ml of Three-dimensional MR images were rendered by post-processing of the original MR data on an independent workstation. The three-dimensional MR images were interpreted, and findings are reported in the following complete MRI report for this study. Three dimensional images were evaluated at the independent interpreting workstation using the DynaCAD thin client. COMPARISON:  Previous exam(s). FINDINGS: Breast composition: a. Almost entirely fat. Background parenchymal enhancement: Mild Right breast: Biopsy clip within a spiculated enhancing mass measuring approximately 2.5 x 2 x 1.9 cm about the lower slightly outer breast, posterior third. Left breast: No mass or abnormal enhancement. Lymph nodes: Biopsy clip within an enlarged right level 1 lymph node measuring up to 1.9 cm. No suspicious lymph nodes on the left. Ancillary findings:  None. IMPRESSION: Biopsy-proven right breast cancer with metastasis to the right axilla as above. RECOMMENDATION: Surgical/oncology consultation with excision when clinically appropriate. BI-RADS CATEGORY  6: Known biopsy-proven malignancy. Electronically Signed   By: Curtistine Noble   On: 09/01/2024 15:17    ASSESSMENT & PLAN Katharina Jehle is a 57 y.o. female who presents to the clinic for continued management of right breast cancer.   #Right breast cancer: --Underwent core biopsy of right breast mass that revealed invasive ductal carcinoma, 1.8 cm, right breast, 7 o'clock position, with axillary lymph node involvement. Functionally triple negative due to minimal ER positivity (2%). High-grade, High Ki 67 40%.  --Started Cycle 1, Day 1 of carboplatin /paclitaxel /keytruda  on 09/17/2024 PLAN: --Due for Cycle 1, Day 15 on 10/02/2024. --Labs from today were reviewed and adequate for treatment. WBC 5.4, Hgb 11.5, Plt 284, creatinine and LFTs are normal.  --Due to worsening neuropathy, discussed dose  reduction with Dr. Gudena, who advised to dose reduce paclitaxel  to 65 mg/m2.  --RTC for next toxicity check prior to Cycle 2, Day 1.   #Sore throat #Acid reflux: --Currently on Pepcid  20 mg twice daily and Protonix  40 mg once daily. --Due to residual symptoms, will trial Carafate .  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   No orders of the defined types were placed in this encounter.   All questions were answered. The patient knows to call the clinic with any problems, questions or concerns.  I have spent a total of 30 minutes minutes of face-to-face and non-face-to-face time, preparing to see the patient, performing a medically appropriate examination, counseling and educating the patient, ordering medications/tests/procedures,  documenting clinical information in the electronic health record, independently interpreting results and communicating results to the patient, and care coordination.   Johnston Police, PA-C Department of Hematology/Oncology [facility] Phone: [phone] "

## 2024-10-02 ENCOUNTER — Inpatient Hospital Stay

## 2024-10-02 VITALS — BP 122/90 | HR 73 | Temp 97.8°F | Resp 16

## 2024-10-02 DIAGNOSIS — C50511 Malignant neoplasm of lower-outer quadrant of right female breast: Secondary | ICD-10-CM

## 2024-10-02 DIAGNOSIS — Z5111 Encounter for antineoplastic chemotherapy: Secondary | ICD-10-CM | POA: Diagnosis not present

## 2024-10-02 MED ORDER — SODIUM CHLORIDE 0.9 % IV SOLN: INTRAVENOUS | Status: DC

## 2024-10-02 MED ORDER — DIPHENHYDRAMINE HCL 50 MG/ML IJ SOLN
50.0000 mg | Freq: Once | INTRAMUSCULAR | Status: AC
  Administered 2024-10-02: 50 mg via INTRAVENOUS
  Filled 2024-10-02: qty 1

## 2024-10-02 MED ORDER — SODIUM CHLORIDE 0.9 % IV SOLN
65.0000 mg/m2 | Freq: Once | INTRAVENOUS | Status: AC
  Administered 2024-10-02: 150 mg via INTRAVENOUS
  Filled 2024-10-02: qty 25

## 2024-10-02 MED ORDER — SODIUM CHLORIDE 0.9 % IV SOLN
202.2000 mg | Freq: Once | INTRAVENOUS | Status: AC
  Administered 2024-10-02: 200 mg via INTRAVENOUS
  Filled 2024-10-02: qty 20

## 2024-10-02 MED ORDER — PALONOSETRON HCL INJECTION 0.25 MG/5ML
0.2500 mg | Freq: Once | INTRAVENOUS | Status: AC
  Administered 2024-10-02: 0.25 mg via INTRAVENOUS
  Filled 2024-10-02: qty 5

## 2024-10-02 MED ORDER — DEXAMETHASONE SOD PHOSPHATE PF 10 MG/ML IJ SOLN
10.0000 mg | Freq: Once | INTRAMUSCULAR | Status: AC
  Administered 2024-10-02: 10 mg via INTRAVENOUS

## 2024-10-02 MED ORDER — FAMOTIDINE IN NACL 20-0.9 MG/50ML-% IV SOLN
20.0000 mg | Freq: Once | INTRAVENOUS | Status: AC
  Administered 2024-10-02: 20 mg via INTRAVENOUS
  Filled 2024-10-02: qty 50

## 2024-10-02 NOTE — Patient Instructions (Signed)

## 2024-10-05 ENCOUNTER — Other Ambulatory Visit: Payer: Self-pay | Admitting: *Deleted

## 2024-10-05 ENCOUNTER — Inpatient Hospital Stay

## 2024-10-05 VITALS — BP 128/70 | HR 58 | Temp 97.8°F | Resp 17

## 2024-10-05 DIAGNOSIS — C50511 Malignant neoplasm of lower-outer quadrant of right female breast: Secondary | ICD-10-CM

## 2024-10-05 DIAGNOSIS — Z5111 Encounter for antineoplastic chemotherapy: Secondary | ICD-10-CM | POA: Diagnosis not present

## 2024-10-05 LAB — URINALYSIS, COMPLETE (UACMP) WITH MICROSCOPIC
Bacteria, UA: NONE SEEN
Bilirubin Urine: NEGATIVE
Glucose, UA: NEGATIVE mg/dL
Hgb urine dipstick: NEGATIVE
Ketones, ur: NEGATIVE mg/dL
Nitrite: NEGATIVE
Protein, ur: NEGATIVE mg/dL
Specific Gravity, Urine: 1.012 (ref 1.005–1.030)
pH: 8 (ref 5.0–8.0)

## 2024-10-05 MED ORDER — FILGRASTIM-SNDZ 480 MCG/0.8ML IJ SOSY
480.0000 ug | PREFILLED_SYRINGE | Freq: Once | INTRAMUSCULAR | Status: AC
  Administered 2024-10-05: 480 ug via SUBCUTANEOUS
  Filled 2024-10-05: qty 0.8

## 2024-10-06 ENCOUNTER — Other Ambulatory Visit: Payer: Self-pay | Admitting: Physician Assistant

## 2024-10-06 ENCOUNTER — Inpatient Hospital Stay

## 2024-10-06 ENCOUNTER — Other Ambulatory Visit: Payer: Self-pay | Admitting: Hematology and Oncology

## 2024-10-06 VITALS — BP 127/69 | HR 65 | Temp 97.9°F | Resp 17

## 2024-10-06 DIAGNOSIS — Z5111 Encounter for antineoplastic chemotherapy: Secondary | ICD-10-CM | POA: Diagnosis not present

## 2024-10-06 DIAGNOSIS — Z170 Estrogen receptor positive status [ER+]: Secondary | ICD-10-CM

## 2024-10-06 LAB — URINE CULTURE: Culture: <10000 — AB

## 2024-10-06 MED ORDER — FILGRASTIM-SNDZ 480 MCG/0.8ML IJ SOSY
480.0000 ug | PREFILLED_SYRINGE | Freq: Once | INTRAMUSCULAR | Status: AC
  Administered 2024-10-06: 480 ug via SUBCUTANEOUS
  Filled 2024-10-06: qty 0.8

## 2024-10-06 MED ORDER — CIPROFLOXACIN HCL 500 MG PO TABS: 500.0000 mg | ORAL_TABLET | Freq: Two times a day (BID) | ORAL | 0 refills | Status: AC

## 2024-10-07 ENCOUNTER — Inpatient Hospital Stay

## 2024-10-07 VITALS — BP 128/70 | HR 76 | Temp 98.7°F | Resp 18

## 2024-10-07 DIAGNOSIS — Z5111 Encounter for antineoplastic chemotherapy: Secondary | ICD-10-CM | POA: Diagnosis not present

## 2024-10-07 DIAGNOSIS — C50511 Malignant neoplasm of lower-outer quadrant of right female breast: Secondary | ICD-10-CM

## 2024-10-07 MED ORDER — FILGRASTIM-SNDZ 480 MCG/0.8ML IJ SOSY
480.0000 ug | PREFILLED_SYRINGE | Freq: Once | INTRAMUSCULAR | Status: AC
  Administered 2024-10-07: 480 ug via SUBCUTANEOUS
  Filled 2024-10-07: qty 0.8

## 2024-10-13 ENCOUNTER — Inpatient Hospital Stay: Attending: Hematology and Oncology

## 2024-10-13 ENCOUNTER — Inpatient Hospital Stay

## 2024-10-13 ENCOUNTER — Encounter: Payer: Self-pay | Admitting: *Deleted

## 2024-10-13 ENCOUNTER — Inpatient Hospital Stay: Admitting: Hematology and Oncology

## 2024-10-13 VITALS — BP 120/80 | HR 74 | Temp 97.8°F | Resp 17 | Wt 271.2 lb

## 2024-10-13 VITALS — BP 118/78 | HR 69 | Temp 98.1°F

## 2024-10-13 DIAGNOSIS — T451X5A Adverse effect of antineoplastic and immunosuppressive drugs, initial encounter: Secondary | ICD-10-CM | POA: Insufficient documentation

## 2024-10-13 DIAGNOSIS — Z5111 Encounter for antineoplastic chemotherapy: Secondary | ICD-10-CM | POA: Diagnosis present

## 2024-10-13 DIAGNOSIS — Z170 Estrogen receptor positive status [ER+]: Secondary | ICD-10-CM

## 2024-10-13 DIAGNOSIS — H538 Other visual disturbances: Secondary | ICD-10-CM | POA: Insufficient documentation

## 2024-10-13 DIAGNOSIS — Z79899 Other long term (current) drug therapy: Secondary | ICD-10-CM | POA: Insufficient documentation

## 2024-10-13 DIAGNOSIS — C50511 Malignant neoplasm of lower-outer quadrant of right female breast: Secondary | ICD-10-CM | POA: Diagnosis not present

## 2024-10-13 DIAGNOSIS — Z171 Estrogen receptor negative status [ER-]: Secondary | ICD-10-CM | POA: Diagnosis not present

## 2024-10-13 DIAGNOSIS — Z17421 Hormone receptor negative with human epidermal growth factor receptor 2 negative status: Secondary | ICD-10-CM | POA: Diagnosis not present

## 2024-10-13 LAB — CBC WITH DIFFERENTIAL (CANCER CENTER ONLY)
Abs Immature Granulocytes: 0.26 K/uL — ABNORMAL HIGH (ref 0.00–0.07)
Basophils Absolute: 0.1 K/uL (ref 0.0–0.1)
Basophils Relative: 1 %
Eosinophils Absolute: 0 K/uL (ref 0.0–0.5)
Eosinophils Relative: 0 %
HCT: 37.6 % (ref 36.0–46.0)
Hemoglobin: 12.2 g/dL (ref 12.0–15.0)
Immature Granulocytes: 5 %
Lymphocytes Relative: 37 %
Lymphs Abs: 1.9 K/uL (ref 0.7–4.0)
MCH: 27.9 pg (ref 26.0–34.0)
MCHC: 32.4 g/dL (ref 30.0–36.0)
MCV: 86 fL (ref 80.0–100.0)
Monocytes Absolute: 0.6 K/uL (ref 0.1–1.0)
Monocytes Relative: 12 %
Neutro Abs: 2.3 K/uL (ref 1.7–7.7)
Neutrophils Relative %: 45 %
Platelet Count: 171 K/uL (ref 150–400)
RBC: 4.37 MIL/uL (ref 3.87–5.11)
RDW: 14.7 % (ref 11.5–15.5)
WBC Count: 5.2 K/uL (ref 4.0–10.5)
nRBC: 0 % (ref 0.0–0.2)

## 2024-10-13 LAB — CMP (CANCER CENTER ONLY)
ALT: <5 U/L (ref 0–44)
AST: 18 U/L (ref 15–41)
Albumin: 4.2 g/dL (ref 3.5–5.0)
Alkaline Phosphatase: 89 U/L (ref 38–126)
Anion gap: 10 (ref 5–15)
BUN: 16 mg/dL (ref 6–20)
CO2: 24 mmol/L (ref 22–32)
Calcium: 9.4 mg/dL (ref 8.9–10.3)
Chloride: 103 mmol/L (ref 98–111)
Creatinine: 0.78 mg/dL (ref 0.44–1.00)
GFR, Estimated: >60 mL/min
Glucose, Bld: 86 mg/dL (ref 70–99)
Potassium: 4.1 mmol/L (ref 3.5–5.1)
Sodium: 137 mmol/L (ref 135–145)
Total Bilirubin: 0.3 mg/dL (ref 0.0–1.2)
Total Protein: 6.6 g/dL (ref 6.5–8.1)

## 2024-10-13 MED ORDER — DEXAMETHASONE SOD PHOSPHATE PF 10 MG/ML IJ SOLN
10.0000 mg | Freq: Once | INTRAMUSCULAR | Status: AC
  Administered 2024-10-13: 10 mg via INTRAVENOUS

## 2024-10-13 MED ORDER — SODIUM CHLORIDE 0.9 % IV SOLN
200.0000 mg | Freq: Once | INTRAVENOUS | Status: AC
  Administered 2024-10-13: 200 mg via INTRAVENOUS
  Filled 2024-10-13: qty 200

## 2024-10-13 MED ORDER — PALONOSETRON HCL INJECTION 0.25 MG/5ML
0.2500 mg | Freq: Once | INTRAVENOUS | Status: AC
  Administered 2024-10-13: 0.25 mg via INTRAVENOUS
  Filled 2024-10-13: qty 5

## 2024-10-13 MED ORDER — DIPHENHYDRAMINE HCL 50 MG/ML IJ SOLN
50.0000 mg | Freq: Once | INTRAMUSCULAR | Status: AC
  Administered 2024-10-13: 50 mg via INTRAVENOUS
  Filled 2024-10-13: qty 1

## 2024-10-13 MED ORDER — FAMOTIDINE IN NACL 20-0.9 MG/50ML-% IV SOLN
20.0000 mg | Freq: Once | INTRAVENOUS | Status: AC
  Administered 2024-10-13: 20 mg via INTRAVENOUS
  Filled 2024-10-13: qty 50

## 2024-10-13 MED ORDER — SODIUM CHLORIDE 0.9 % IV SOLN
202.2000 mg | Freq: Once | INTRAVENOUS | Status: AC
  Administered 2024-10-13: 200 mg via INTRAVENOUS
  Filled 2024-10-13: qty 20

## 2024-10-13 MED ORDER — SODIUM CHLORIDE 0.9 % IV SOLN: INTRAVENOUS | Status: DC

## 2024-10-13 MED ORDER — TRIAMCINOLONE ACETONIDE 0.025 % EX OINT: 1.0000 | TOPICAL_OINTMENT | Freq: Two times a day (BID) | CUTANEOUS | 0 refills | Status: AC

## 2024-10-13 MED ORDER — SODIUM CHLORIDE 0.9 % IV SOLN
65.0000 mg/m2 | Freq: Once | INTRAVENOUS | Status: AC
  Administered 2024-10-13: 150 mg via INTRAVENOUS
  Filled 2024-10-13: qty 25

## 2024-10-13 NOTE — Progress Notes (Signed)
  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 Functionally triple negative due to minimal ER positivity (2%). High-grade, High Ki 67 40%.  Discussed chemotherapy and immunotherapy options, including Keynote 522 regimen vs carbo/docetaxel with immunotherapy based on NEOPACT. Given borderline PS, I recommended she consider less intensive regimen, but she is extremely worries its not as effective as KEYNOTE 522. She is now on neoadj KEYNOTE 522.  Triple negative breast cancer Receiving systemic therapy with significant tumor response. Therapy adjusted for efficacy and toxicity management. - Administered pembrolizumab  (Keytruda ) every 21 days. - Continued paclitaxel  (Taxol ) at reduced dose. - Continued carboplatin  at weekly dose. - Significant clinical response in right breast. - FU as scheduled.  Chemotherapy-induced alopecia and scalp dermatitis Significant alopecia with scalp tenderness and erythema, emotionally affecting her. - Recommended baby shampoo for scalp cleansing. - Prescribed topical Kenalog  lotion for tenderness and erythema. - Advised coconut oil as scalp moisturizer. - Instructed to apply Kenalog  only to affected areas.  Chemotherapy-induced fatigue and myalgias Significant fatigue and myalgias post-chemotherapy, improved with paclitaxel  dose reduction. Hip and  leg pain likely from GCSF injection. - Continued current chemotherapy dosing. - Advised Claritin use limited to one week post-GCSF injection.  Chemotherapy-induced peripheral neuropathy Improvement in neuropathy symptoms post-paclitaxel  dose reduction. - Continued current dose of paclitaxel .  Urinary tract infection Improved with antibiotics, nearing completion of course. - Instructed to complete antibiotic course. - Advised to notify clinic if symptoms recur.   HISTORY OF PRESENTING ILLNESS:  Alicia Bentley 59 y.o. female is here because of new diagnosis of right breast cancer  Discussed the use of AI scribe software for clinical note transcription with the patient, who gave verbal consent to proceed.  History of Present Illness  Iliyana Convey is a 59 year old female with functionally triple negative breast cancer presenting for follow-up of treatment response and management of chemotherapy-related toxicities.  She is currently receiving weekly paclitaxel  (with recent dose reduction), low-dose weekly carboplatin , and pembrolizumab  every 21 days. She reports significant improvement in gastrointestinal symptoms, with no nausea, vomiting, or diarrhea in the past week. Neuropathy symptoms have also improved since the last cycle.  She describes increased right-sided hip pain and generalized arthralgias since her last injection, severe enough to interfere with mobility, including difficulty rolling over in bed and pain with ambulation. Fatigue remains prominent, with profound weakness and somnolence, particularly on the second day after treatment, when she slept approximately eighteen hours. She also notes increased appetite and flu-like symptoms following chemotherapy.  She recently completed a course of antibiotics for a urinary tract infection, with significant improvement in dysuria and pain. She has approximately three antibiotic pills remaining and is nearing completion of  therapy.  She experienced hair loss in clumps, which led her to shave her head. She describes her scalp, particularly in the frontal area, as very sensitive, tender, and erythematous, but without ulceration. She has avoided washing her scalp due to dryness and sensitivity and inquired about appropriate moisturizing options. She  expresses emotional distress related to hair loss, stating, I've never felt less feminine than I do right now.  She is hopeful that her breast tumor is shrinking, though she is uncertain of the degree of improvement. She reports mild tenderness at her port site but no other local symptoms. She continues to take loratadine for joint pain associated with her injection and sought clarification regarding the appropriate duration for this medication.   All other systems were reviewed with the patient and are negative.  MEDICAL HISTORY:  Past Medical History:  Diagnosis Date   Cystitis, interstitial    Diverticulosis of colon (without mention of hemorrhage)    DJD (degenerative joint disease)    Family history of brain tumor    Family history of breast cancer    Family history of colon cancer    Family history of liver cancer    Family history of melanoma    Family history of non-Hodgkin lymphoma    Fatty liver    GERD (gastroesophageal reflux disease)    Headache(784.0)    Hiatal hernia    Internal hemorrhoids    Irritable bowel syndrome    Miscarriage    Panic disorder    PONV (postoperative nausea and vomiting)    Tubular adenoma of colon 2010   Unspecified essential hypertension    Unspecified urinary incontinence     SURGICAL HISTORY: Past Surgical History:  Procedure Laterality Date   ABDOMINAL HYSTERECTOMY     BREAST BIOPSY Right 08/17/2024   US  RT BREAST BX W LOC DEV 1ST LESION IMG BX SPEC US  GUIDE 08/17/2024 [facility]MO[facility]   COLONOSCOPY N/A 12/29/2012   Procedure: COLONOSCOPY;  Surgeon: Alm JONELLE Gander, MD;  Location: [facility];  Service:  Endoscopy;  Laterality: N/A;   deviated septrum     NASAL SINUS SURGERY     NISSEN FUNDOPLICATION     PORTACATH PLACEMENT Left 09/14/2024   Procedure: INSERTION, TUNNELED CENTRAL VENOUS DEVICE, WITH PORT;  Surgeon: Curvin Deward MOULD, MD;  Location: [facility] OR;  Service: General;  Laterality: Left;  WITH ULTRASOUND GUIDANCE   SKIN GRAFT     right ear   TYMPANOSTOMY TUBE PLACEMENT     x 10    SOCIAL HISTORY: Social History   Socioeconomic History   Marital status: Married    Spouse name: Not on file   Number of children: 0   Years of education: Not on file   Highest education level: Not on file  Occupational History   Occupation: housewife  Tobacco Use   Smoking status: Former    Types: Cigarettes   Smokeless tobacco: Never   Tobacco comments:    college years  Vaping Use   Vaping status: Never Used  Substance and Sexual Activity   Alcohol use: No   Drug use: No   Sexual activity: Not on file  Other Topics Concern   Not on file  Social History Narrative   Adopted daughter   Social Drivers of Health   Tobacco Use: Medium Risk (09/10/2024)   Patient History    Smoking Tobacco Use: Former    Smokeless Tobacco Use: Never    Passive Exposure: Not on Actuary Strain: Not on file  Food Insecurity: No Food Insecurity (08/26/2024)   Epic    Worried About Radiation Protection Practitioner of Food in the Last Year: Never true    Ran Out of Food in the Last Year: Never true  Transportation Needs: No Transportation Needs (08/26/2024)   Epic  Lack of Transportation (Medical): No    Lack of Transportation (Non-Medical): No  Physical Activity: Not on file  Stress: Not on file  Social Connections: Not on file  Intimate Partner Violence: Not At Risk (08/26/2024)   Epic    Fear of Current or Ex-Partner: No    Emotionally Abused: No    Physically Abused: No    Sexually Abused: No  Depression (PHQ2-9): Low Risk (10/13/2024)   Depression (PHQ2-9)    PHQ-2 Score: 0  Alcohol Screen: Not on  file  Housing: Low Risk (08/26/2024)   Epic    Unable to Pay for Housing in the Last Year: No    Number of Times Moved in the Last Year: 0    Homeless in the Last Year: No  Utilities: Not At Risk (08/26/2024)   Epic    Threatened with loss of utilities: No  Health Literacy: Low Risk (03/28/2022)   Received from [facility]   Health Literacy    : Never    FAMILY HISTORY: Family History  Problem Relation Age of Onset   Heart disease Mother    Non-Hodgkin's lymphoma Mother    Stroke Mother    Hypertension Mother    Deep vein thrombosis Mother    Diabetes Father    Heart disease Father    Dementia Father    Stroke Father    Melanoma Father        mets to lymph nodes   Hypertension Father    Pancreatic cancer Paternal Uncle    Stomach cancer Paternal Uncle        mets from pancreas   Pneumonia Maternal Grandmother    Other Maternal Grandfather        esophageal problems   Stroke Paternal Grandmother    Colon cancer Neg Hx    Esophageal cancer Neg Hx     ALLERGIES:  is allergic to codeine, hibiclens  [chlorhexidine  gluconate], hydrocodone -acetaminophen , oxycodone , tramadol , tramadol  hcl, albuterol, and lexapro [escitalopram oxalate].  MEDICATIONS:  Current Outpatient Medications  Medication Sig Dispense Refill   acetaminophen  (TYLENOL ) 650 MG CR tablet Take 2 tablets by mouth daily as needed.      aspirin 81 MG tablet Take 81 mg by mouth daily.     Azelastine-Fluticasone 137-50 MCG/ACT SUSP Place into the nose.     bisacodyl (DULCOLAX) 5 MG EC tablet Take 5 mg by mouth daily as needed for moderate constipation.     buPROPion (WELLBUTRIN XL) 150 MG 24 hr tablet Take 150 mg by mouth every morning.     Calcium Carb-Cholecalciferol (CALCIUM + VITAMIN D3 PO) Take 600 mg by mouth daily.     cetirizine (ZYRTEC) 10 MG tablet Take 10 mg by mouth daily.     ciprofloxacin  (CIPRO ) 500 MG tablet Take 1 tablet (500 mg total) by mouth 2 (two) times daily. 14 tablet 0    dexamethasone  (DECADRON ) 4 MG tablet Take 2 tablets daily for 2 days, start the day after chemotherapy. Take with food. 30 tablet 1   famotidine  (PEPCID ) 20 MG tablet Take 20 mg by mouth at bedtime. (Patient taking differently: Take 20 mg by mouth 2 (two) times daily.)     hydrOXYzine (ATARAX) 25 MG tablet Take 25 mg by mouth 2 (two) times daily as needed.     hyoscyamine  (LEVSIN  SL) 0.125 MG SL tablet Place 1 tablet (0.125 mg total) under the tongue every 8 (eight) hours as needed. 30 tablet 1   lidocaine -prilocaine  (EMLA ) cream Apply to affected  area once 30 g 3   linaclotide  (LINZESS ) 290 MCG CAPS capsule Take 1 capsule (290 mcg total) by mouth daily before breakfast. 90 capsule 3   losartan (COZAAR) 50 MG tablet Take 50 mg by mouth daily.     meloxicam (MOBIC) 15 MG tablet Take 15 mg by mouth daily.     metaxalone (SKELAXIN) 800 MG tablet Take 800 mg by mouth 3 (three) times daily.     metFORMIN (GLUCOPHAGE) 500 MG tablet Take 500 mg by mouth 2 (two) times daily with a meal.     Multiple Vitamins-Minerals (ONE-A-DAY WOMENS 50 PLUS PO) Take 1 capsule by mouth daily.     Omega-3 Fatty Acids (FISH OIL) 1200 MG CAPS Take 1 capsule by mouth daily.     ondansetron  (ZOFRAN ) 8 MG tablet Take 1 tablet (8 mg total) by mouth every 8 (eight) hours as needed for nausea or vomiting. Start on the third day after chemotherapy. 30 tablet 1   oxyCODONE  (ROXICODONE ) 5 MG immediate release tablet Take 1 tablet (5 mg total) by mouth every 6 (six) hours as needed. 5 tablet 0   pantoprazole  (PROTONIX ) 40 MG tablet Take 1 tablet (40 mg total) by mouth daily. (Patient taking differently: Take 40 mg by mouth 2 (two) times daily.) 30 tablet 1   polyethylene glycol powder (GLYCOLAX/MIRALAX) 17 GM/SCOOP powder Take 17 g by mouth daily. Dissolve 1 capful (17g) in 4-8 ounces of liquid and take by mouth daily.     PROBIOTIC PRODUCT PO Take 1 capsule by mouth daily.     prochlorperazine  (COMPAZINE ) 10 MG tablet Take 1 tablet  (10 mg total) by mouth every 6 (six) hours as needed for nausea or vomiting. 30 tablet 1   Sodium Sulfate-Mag Sulfate-KCl (SUTAB ) 1479-225-188 MG TABS Use as directed for colonoscopy. MANUFACTURER CODES!! BIN: [id number] PCN: CN GROUP: [id number] MEMBER ID: [id number];MLW AS SECONDARY INSURANCE ;NO PRIOR AUTHORIZATION 24 tablet 0   sucralfate  (CARAFATE ) 1 GM/10ML suspension Take 10 mLs (1 g total) by mouth 4 (four) times daily -  with meals and at bedtime. 420 mL 0   triamterene-hydrochlorothiazide (MAXZIDE) 75-50 MG per tablet Take 1 tablet by mouth daily.     ZEPBOUND 12.5 MG/0.5ML Pen Inject 12.5 mg into the skin once a week.     No current facility-administered medications for this visit.     PHYSICAL EXAMINATION: ECOG PERFORMANCE STATUS: 1 - Symptomatic but completely ambulatory  BP 120/80 (Patient Position: Sitting)   Pulse 74   Temp 97.8 F (36.6 C)   Resp 17   Wt 271 lb 3.2 oz (123 kg)   SpO2 99%   BMI 46.55 kg/m   GENERAL:alert, no distress and comfortable, obese. Port site appears well Significant improvement in right breast mass. Right axillary adenopathy not palpable today CTA bilaterally RRR No LE edema.  LABORATORY DATA:  I have reviewed the data as listed Lab Results  Component Value Date   WBC 5.2 10/13/2024   HGB 12.2 10/13/2024   HCT 37.6 10/13/2024   MCV 86.0 10/13/2024   PLT 171 10/13/2024     Chemistry      Component Value Date/Time   NA 137 10/13/2024 1044   K 4.1 10/13/2024 1044   CL 103 10/13/2024 1044   CO2 24 10/13/2024 1044   BUN 16 10/13/2024 1044   CREATININE 0.78 10/13/2024 1044      Component Value Date/Time   CALCIUM 9.4 10/13/2024 1044   ALKPHOS 89 10/13/2024 1044   AST 18  10/13/2024 1044   ALT <5 10/13/2024 1044   BILITOT 0.3 10/13/2024 1044       RADIOGRAPHIC STUDIES: I have personally reviewed the radiological images as listed and agreed with the findings in the report. DG CHEST PORT 1 VIEW Result Date: 09/14/2024 CLINICAL  DATA:  Port-A-Cath EXAM: PORTABLE CHEST 1 VIEW COMPARISON:  08/05/2013, PET CT 08/28/2024 FINDINGS: Interim placement of left-sided central venous port with tip projecting over the upper SVC. Hypoventilatory changes with mild atelectasis and or scarring at left base. No pneumothorax is seen. Cardiac size is normal IMPRESSION: Interim placement of left-sided central venous port with tip projecting over the upper SVC. No pneumothorax. Electronically Signed   By: Luke Bun M.D.   On: 09/14/2024 15:57   DG C-Arm 1-60 Min-No Report Result Date: 09/14/2024 Fluoroscopy was utilized by the requesting physician.  No radiographic interpretation.    All questions were answered. The patient knows to call the clinic with any problems, questions or concerns. I spent 30 minutes in the care of this patient including H and P, review of records, counseling and coordination of care.     Amber Stalls, MD 10/13/2024 11:24 AM

## 2024-10-13 NOTE — Patient Instructions (Signed)

## 2024-10-14 NOTE — Progress Notes (Signed)
 [facility] OFFICE PROGRESS NOTE  [name], MD [phone] [address] [place] [address]  DIAGNOSIS:   Oncology History  Malignant neoplasm of lower-outer quadrant of right breast of female, estrogen receptor positive (HCC)  08/21/2024 Initial Diagnosis   Malignant neoplasm of lower-outer quadrant of right breast of female, estrogen receptor positive (HCC)   08/26/2024 Cancer Staging   Staging form: Breast, AJCC 8th Edition - Clinical: Stage IIB (cT1c, cN1, cM0, G3, ER-, PR-, HER2-) - Signed by Loretha Ash, MD on 08/26/2024 Stage prefix: Initial diagnosis Histologic grading system: 3 grade system   09/05/2024 Genetic Testing   Negative [facility] CancerNext- Expanded + RNA insight. Report date 09/05/2024.  [facility] CancerNext-Expanded + RNAinsight gene panel includes sequencing, rearrangement, and RNA analysis for the following 77 genes: AIP, ALK, APC, ATM, AXIN2, BAP1, BARD1, BMPR1A, BRCA1, BRCA2, BRIP1, CDC73, CDH1, CDK4, CDKN1B, CDKN2A, CEBPA, CHEK2, CTNNA1, DDX41, DICER1, ETV6, FH, FLCN, GATA2, LZTR1, MAX, MBD4, MEN1, MET, MLH1, MSH2, MSH3, MSH6, MUTYH, NF1, NF2, NTHL1, PALB2, PHOX2B, PMS2, POT1, PRKAR1A, PTCH1, PTEN, RAD51C, RAD51D, RB1, RET, RPS20, RUNX1, SDHA, SDHAF2, SDHB, SDHC, SDHD, SMAD4, SMARCA4, SMARCB1, SMARCE1, STK11, SUFU, TMEM127, TP53, TSC1, TSC2, VHL, and WT1 (sequencing and deletion/duplication); EGFR, HOXB13, KIT, MITF, PDGFRA, POLD1, and POLE (sequencing only); EPCAM and GREM1 (deletion/duplication only).     09/17/2024 -  Chemotherapy   Patient is on Treatment Plan : BREAST Pembrolizumab  (200) D1 + Carboplatin  (1.5) D1,8,15 + Paclitaxel  (80) D1,8,15 q21d X 4 cycles / Pembrolizumab  (200) D1 + AC D1 q21d x 4 cycles       CURRENT THERAPY: Weekly carboplatin  and paclitaxel .  She is on carboplatin  for an AUC of 1.5 and paclitaxel  and 65 mg/m2. She received Keytruda  once every 3 weeks.   INTERVAL HISTORY: Alicia Bentley 59 y.o. female  returns to the clinic today for a follow-up visit accompanied by her husband.  The patient was last seen in clinic last by Dr. Loretha.  The patient is currently undergoing treatment for breast cancer.  She received carboplatin  and Taxol  weekly.  She also receives immunotherapy every 3 weeks.  She denies any major changes in her health since she was last seen except noticed it took a little longer to recover from her fatigue. She also mentioned some mild nausea a few days after chemotherapy. She uses compazine  which she took about two doses for a few days with control of her nausea. She denies vomiting. 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   No fevers, chills, or new masses have been noted. Surgery is anticipated to be in May.   She has experienced increased appetite and weight gain since discontinuing Zepbound injections in mid-November, after previously losing 96 pounds through a weight management program. She expresses frustration with regaining weight and increased hunger, noting a return to prior eating habits, including increased bread consumption.  Reports some dry scalp, hands, and lip without rash.  Uses Biotene mouthwash, CeraVe, Eucerin lotion.  She also uses saline and Flonase nasal sprays.  She uses Aquaphor ointment on the outside of her nose because she believes due to the dry air that has been exacerbating nosebleeds in the morning.  She reports when she wakes up in the morning that she has some congestion and blows her nose and blows out a clot.  Otherwise she is fine for the rest of the day.  She has shaved her head  due to alopecia and she notes some scalp sensitivity.  She uses the cream that was prescribed by Dr. Loretha.   She denies shortness of breath, cough, headaches, vision changes, or new bone pain except for a recent episode of severe right hip pain following hematopoietic growth factor  injection, which resolved. She takes daily Claritin and Zyrtec for allergies. No unusual abdominal pain, swelling, or tenderness. COVID vaccination has not been received this year due to timing with PET scan and treatment initiation. Colonoscopy and endoscopy for IBS are pending due to current treatment.  She is here for evaluation and repeat blood work before undergoing her day of treatment. She is scheduled for carboplatin /taxol  today.    MEDICAL HISTORY: Past Medical History:  Diagnosis Date   Cystitis, interstitial    Diverticulosis of colon (without mention of hemorrhage)    DJD (degenerative joint disease)    Family history of brain tumor    Family history of breast cancer    Family history of colon cancer    Family history of liver cancer    Family history of melanoma    Family history of non-Hodgkin lymphoma    Fatty liver    GERD (gastroesophageal reflux disease)    Headache(784.0)    Hiatal hernia    Internal hemorrhoids    Irritable bowel syndrome    Miscarriage    Panic disorder    PONV (postoperative nausea and vomiting)    Tubular adenoma of colon 2010   Unspecified essential hypertension    Unspecified urinary incontinence     ALLERGIES:  is allergic to codeine, hibiclens  [chlorhexidine  gluconate], hydrocodone -acetaminophen , oxycodone , tramadol , tramadol  hcl, albuterol, and lexapro [escitalopram oxalate].  MEDICATIONS:  Current Outpatient Medications  Medication Sig Dispense Refill   acetaminophen  (TYLENOL ) 650 MG CR tablet Take 2 tablets by mouth daily as needed.      aspirin 81 MG tablet Take 81 mg by mouth daily.     Azelastine-Fluticasone 137-50 MCG/ACT SUSP Place into the nose.     bisacodyl (DULCOLAX) 5 MG EC tablet Take 5 mg by mouth daily as needed for moderate constipation.     buPROPion (WELLBUTRIN XL) 150 MG 24 hr tablet Take 150 mg by mouth every morning.     Calcium Carb-Cholecalciferol (CALCIUM + VITAMIN D3 PO) Take 600 mg by mouth daily.      cetirizine (ZYRTEC) 10 MG tablet Take 10 mg by mouth daily.     ciprofloxacin  (CIPRO ) 500 MG tablet Take 1 tablet (500 mg total) by mouth 2 (two) times daily. 14 tablet 0   dexamethasone  (DECADRON ) 4 MG tablet Take 2 tablets daily for 2 days, start the day after chemotherapy. Take with food. 30 tablet 1   famotidine  (PEPCID ) 20 MG tablet Take 20 mg by mouth at bedtime.     hydrOXYzine (ATARAX) 25 MG tablet Take 25 mg by mouth 2 (two) times daily as needed.     hyoscyamine  (LEVSIN  SL) 0.125 MG SL tablet Place 1 tablet (0.125 mg total) under the tongue every 8 (eight) hours as needed. 30 tablet 1   lidocaine -prilocaine  (EMLA ) cream Apply to affected area once 30 g 3   linaclotide  (LINZESS ) 290 MCG CAPS capsule Take 1 capsule (290 mcg total) by mouth daily before breakfast. 90 capsule 3   losartan (COZAAR) 50 MG tablet Take 50 mg by mouth daily.     meloxicam (MOBIC) 15 MG tablet Take 15 mg by mouth daily.     metaxalone (SKELAXIN) 800 MG tablet Take  800 mg by mouth 3 (three) times daily.     metFORMIN (GLUCOPHAGE) 500 MG tablet Take 500 mg by mouth 2 (two) times daily with a meal.     Multiple Vitamins-Minerals (ONE-A-DAY WOMENS 50 PLUS PO) Take 1 capsule by mouth daily.     Omega-3 Fatty Acids (FISH OIL) 1200 MG CAPS Take 1 capsule by mouth daily.     ondansetron  (ZOFRAN ) 8 MG tablet Take 1 tablet (8 mg total) by mouth every 8 (eight) hours as needed for nausea or vomiting. Start on the third day after chemotherapy. 30 tablet 1   oxyCODONE  (ROXICODONE ) 5 MG immediate release tablet Take 1 tablet (5 mg total) by mouth every 6 (six) hours as needed. 5 tablet 0   pantoprazole  (PROTONIX ) 40 MG tablet Take 1 tablet (40 mg total) by mouth daily. 30 tablet 1   polyethylene glycol powder (GLYCOLAX/MIRALAX) 17 GM/SCOOP powder Take 17 g by mouth daily. Dissolve 1 capful (17g) in 4-8 ounces of liquid and take by mouth daily.     PROBIOTIC PRODUCT PO Take 1 capsule by mouth daily.     prochlorperazine   (COMPAZINE ) 10 MG tablet Take 1 tablet (10 mg total) by mouth every 6 (six) hours as needed for nausea or vomiting. 30 tablet 1   Sodium Sulfate-Mag Sulfate-KCl (SUTAB ) 1479-225-188 MG TABS Use as directed for colonoscopy. MANUFACTURER CODES!! BIN: [id number] PCN: CN GROUP: [id number] MEMBER ID: [id number];MLW AS SECONDARY INSURANCE ;NO PRIOR AUTHORIZATION 24 tablet 0   sucralfate  (CARAFATE ) 1 GM/10ML suspension Take 10 mLs (1 g total) by mouth 4 (four) times daily -  with meals and at bedtime. 420 mL 0   triamcinolone  (KENALOG ) 0.025 % ointment Apply 1 Application topically 2 (two) times daily. 30 g 0   triamterene-hydrochlorothiazide (MAXZIDE) 75-50 MG per tablet Take 1 tablet by mouth daily.     ZEPBOUND 12.5 MG/0.5ML Pen Inject 12.5 mg into the skin once a week.     No current facility-administered medications for this visit.    SURGICAL HISTORY:  Past Surgical History:  Procedure Laterality Date   ABDOMINAL HYSTERECTOMY     BREAST BIOPSY Right 08/17/2024   US  RT BREAST BX W LOC DEV 1ST LESION IMG BX SPEC US  GUIDE 08/17/2024 [facility] MAMMOGRAPHY   COLONOSCOPY N/A 12/29/2012   Procedure: COLONOSCOPY;  Surgeon: Alm JONELLE Gander, MD;  Location: [facility];  Service: Endoscopy;  Laterality: N/A;   deviated septrum     NASAL SINUS SURGERY     NISSEN FUNDOPLICATION     PORTACATH PLACEMENT Left 09/14/2024   Procedure: INSERTION, TUNNELED CENTRAL VENOUS DEVICE, WITH PORT;  Surgeon: Curvin Deward MOULD, MD;  Location: [facility] OR;  Service: General;  Laterality: Left;  WITH ULTRASOUND GUIDANCE   SKIN GRAFT     right ear   TYMPANOSTOMY TUBE PLACEMENT     x 10    REVIEW OF SYSTEMS:   Review of Systems  Constitutional: Positive for fatigue. Negative for appetite change, chills, fever and unexpected weight change.  HENT: Positive for dry mouth/lips. Positive for dry nasal mucosa. Positive for epistaxis. Negative for mouth sores, nosebleeds, sore throat and trouble swallowing.   Eyes: Negative for eye  problems and icterus.  Respiratory: Negative for cough, hemoptysis, shortness of breath and wheezing.   Cardiovascular: Negative for chest pain and leg swelling.  Gastrointestinal: Mild nausea following treatment. Negative for abdominal pain, constipation, diarrhea, and vomiting.  Genitourinary: Negative for bladder incontinence, difficulty urinating, dysuria, frequency and hematuria.   Musculoskeletal: Negative for back  pain, gait problem, neck pain and neck stiffness.  Skin: Positive for dry skin without rash.  Neurological: Negative for dizziness, extremity weakness, gait problem, headaches, light-headedness and seizures.  Hematological: Negative for adenopathy. Does not bruise/bleed easily.  Psychiatric/Behavioral: Negative for confusion, depression and sleep disturbance. The patient is not nervous/anxious.     PHYSICAL EXAMINATION:  Blood pressure 123/70, pulse 71, temperature (!) 97.2 F (36.2 C), resp. rate 20, weight 270 lb 12.8 oz (122.8 kg), SpO2 99%.  ECOG PERFORMANCE STATUS: 1  Physical Exam  Constitutional: Oriented to person, place, and time and well-developed, well-nourished, and in no distress. HENT:  Head: Normocephalic and atraumatic.  Mouth/Throat: Oropharynx is clear and moist. No oropharyngeal exudate.  Eyes: Conjunctivae are normal. Right eye exhibits no discharge. Left eye exhibits no discharge. No scleral icterus.  Neck: Normal range of motion. Neck supple.  Cardiovascular: Normal rate, regular rhythm, normal heart sounds and intact distal pulses.   Pulmonary/Chest: Effort normal and breath sounds normal. No respiratory distress. No wheezes. No rales.  Abdominal: Soft. Bowel sounds are normal. Exhibits no distension and no mass. There is no tenderness.  Musculoskeletal: Normal range of motion. Exhibits no edema.  Lymphadenopathy:    No cervical adenopathy.  Neurological: Alert and oriented to person, place, and time. Exhibits normal muscle tone. Gait normal.  Coordination normal.  Skin: Skin is warm and dry. No rash noted. Not diaphoretic. No erythema. No pallor.  Psychiatric: Mood, memory and judgment normal.  Vitals reviewed.  LABORATORY DATA: Lab Results  Component Value Date   WBC 4.1 10/21/2024   HGB 11.4 (L) 10/21/2024   HCT 34.7 (L) 10/21/2024   MCV 86.3 10/21/2024   PLT 185 10/21/2024      Chemistry      Component Value Date/Time   NA 139 10/21/2024 1036   K 4.0 10/21/2024 1036   CL 103 10/21/2024 1036   CO2 25 10/21/2024 1036   BUN 15 10/21/2024 1036   CREATININE 0.67 10/21/2024 1036      Component Value Date/Time   CALCIUM 9.0 10/21/2024 1036   ALKPHOS 75 10/21/2024 1036   AST 15 10/21/2024 1036   ALT <5 10/21/2024 1036   BILITOT 0.5 10/21/2024 1036       RADIOGRAPHIC STUDIES:  No results found.   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 Functionally triple negative due to minimal ER positivity (2%). High-grade, High Ki 67 40%.  Discussed chemotherapy and immunotherapy options, including Keynote 522 regimen vs carbo/docetaxel with immunotherapy based on NEOPACT. Given borderline PS, Dr. Loretha recommended she consider less intensive regimen, but she is extremely worried its not as effective as KEYNOTE 522. She is now on neoadj KEYNOTE 522.   Triple negative breast cancer Receiving systemic therapy with significant tumor response. Therapy adjusted for efficacy and toxicity management. - Administered pembrolizumab  (Keytruda ) every 21 days. - Continued paclitaxel  (Taxol ) at reduced dose. - Continued carboplatin  at weekly dose. - FU as scheduled.   Chemotherapy-induced alopecia and scalp dermatitis Significant alopecia with scalp tenderness and erythema.  - Dr. Loretha previously recommended baby shampoo for scalp  cleansing. - Dr. Loretha prescribed topical Kenalog  lotion for tenderness and erythema. Advised coconut oil as scalp moisturizer. Instructed to apply Kenalog  only to affected areas.   Bone pain after hematopoietic growth factor injection Significant right hip pain after injection, consistent with bone marrow  stimulation. Uses daily Claritin as supportive therapy. - Reviewed mechanism of bone pain related to growth factor injection. - Confirmed daily Claritin use as supportive therapy.   Chemotherapy-induced peripheral neuropathy Improvement in neuropathy symptoms post-paclitaxel  dose reduction. - Continued current dose of paclitaxel .   Dry skin, dry mouth, and nasal dryness due to cancer therapy Significant xerosis, xerostomia, and nasal dryness, likely from chemotherapy, immunotherapy, and weather. Epistaxis and nasal crusting noted. - Recommended CeraVe and Eucerin for skin hydration. - Advised biotin dry mouthwash for xerostomia. - Suggested saline nasal spray and Aquaphor/Vaseline for nasal mucosa. - Recommended reducing Flonase use. - Suggested humidifier use. - Discussed cotton gloves with ointment overnight for hand xerosis.  Chemotherapy-induced fatigue and nausea Cumulative fatigue and intermittent nausea related to chemotherapy. Nausea controlled with prochlorperazine ; no vomiting. Avoids ondansetron  due to constipation risk from IBS-C. - Reviewed blood counts to exclude significant cytopenias. - Recommended preemptive prochlorperazine  on days with anticipated nausea. - Advised on sedation risk with prochlorperazine  and constipation risk with ondansetron . - Confirmed adequate nausea control.  The patient was advised to call immediately if she has any concerning symptoms in the interval. The patient voices understanding of current disease status and treatment options and is in agreement with the current care plan. All questions were answered. The patient knows to call the clinic  with any problems, questions or concerns. We can certainly see the patient much sooner if necessary   No orders of the defined types were placed in this encounter.   The total time spent in the appointment was 20-29 miuntes  Dalaya Suppa L Jermale Crass, PA-C 10/21/2024

## 2024-10-15 ENCOUNTER — Other Ambulatory Visit: Payer: Self-pay

## 2024-10-16 ENCOUNTER — Inpatient Hospital Stay: Admitting: Hematology and Oncology

## 2024-10-16 ENCOUNTER — Inpatient Hospital Stay

## 2024-10-21 ENCOUNTER — Inpatient Hospital Stay

## 2024-10-21 ENCOUNTER — Inpatient Hospital Stay: Admitting: Adult Health

## 2024-10-21 ENCOUNTER — Inpatient Hospital Stay (HOSPITAL_BASED_OUTPATIENT_CLINIC_OR_DEPARTMENT_OTHER): Admitting: Physician Assistant

## 2024-10-21 VITALS — BP 123/70 | HR 71 | Temp 97.2°F | Resp 20 | Wt 270.8 lb

## 2024-10-21 VITALS — BP 126/70 | HR 72 | Temp 97.9°F | Resp 19

## 2024-10-21 DIAGNOSIS — C50511 Malignant neoplasm of lower-outer quadrant of right female breast: Secondary | ICD-10-CM

## 2024-10-21 DIAGNOSIS — Z170 Estrogen receptor positive status [ER+]: Secondary | ICD-10-CM

## 2024-10-21 DIAGNOSIS — Z5111 Encounter for antineoplastic chemotherapy: Secondary | ICD-10-CM | POA: Insufficient documentation

## 2024-10-21 LAB — CMP (CANCER CENTER ONLY)
ALT: <5 U/L (ref 0–44)
AST: 15 U/L (ref 15–41)
Albumin: 4 g/dL (ref 3.5–5.0)
Alkaline Phosphatase: 75 U/L (ref 38–126)
Anion gap: 12 (ref 5–15)
BUN: 15 mg/dL (ref 6–20)
CO2: 25 mmol/L (ref 22–32)
Calcium: 9 mg/dL (ref 8.9–10.3)
Chloride: 103 mmol/L (ref 98–111)
Creatinine: 0.67 mg/dL (ref 0.44–1.00)
GFR, Estimated: >60 mL/min
Glucose, Bld: 90 mg/dL (ref 70–99)
Potassium: 4 mmol/L (ref 3.5–5.1)
Sodium: 139 mmol/L (ref 135–145)
Total Bilirubin: 0.5 mg/dL (ref 0.0–1.2)
Total Protein: 6.3 g/dL — ABNORMAL LOW (ref 6.5–8.1)

## 2024-10-21 LAB — CBC WITH DIFFERENTIAL (CANCER CENTER ONLY)
Abs Immature Granulocytes: 0.02 K/uL (ref 0.00–0.07)
Basophils Absolute: 0 K/uL (ref 0.0–0.1)
Basophils Relative: 1 %
Eosinophils Absolute: 0 K/uL (ref 0.0–0.5)
Eosinophils Relative: 1 %
HCT: 34.7 % — ABNORMAL LOW (ref 36.0–46.0)
Hemoglobin: 11.4 g/dL — ABNORMAL LOW (ref 12.0–15.0)
Immature Granulocytes: 1 %
Lymphocytes Relative: 29 %
Lymphs Abs: 1.2 K/uL (ref 0.7–4.0)
MCH: 28.4 pg (ref 26.0–34.0)
MCHC: 32.9 g/dL (ref 30.0–36.0)
MCV: 86.3 fL (ref 80.0–100.0)
Monocytes Absolute: 0.3 K/uL (ref 0.1–1.0)
Monocytes Relative: 7 %
Neutro Abs: 2.6 K/uL (ref 1.7–7.7)
Neutrophils Relative %: 61 %
Platelet Count: 185 K/uL (ref 150–400)
RBC: 4.02 MIL/uL (ref 3.87–5.11)
RDW: 14.9 % (ref 11.5–15.5)
WBC Count: 4.1 K/uL (ref 4.0–10.5)
nRBC: 0 % (ref 0.0–0.2)

## 2024-10-21 MED ORDER — DEXAMETHASONE SOD PHOSPHATE PF 10 MG/ML IJ SOLN
10.0000 mg | Freq: Once | INTRAMUSCULAR | Status: AC
  Administered 2024-10-21: 10 mg via INTRAVENOUS
  Filled 2024-10-21: qty 1

## 2024-10-21 MED ORDER — SODIUM CHLORIDE 0.9 % IV SOLN: INTRAVENOUS | Status: DC

## 2024-10-21 MED ORDER — SODIUM CHLORIDE 0.9 % IV SOLN
65.0000 mg/m2 | Freq: Once | INTRAVENOUS | Status: AC
  Administered 2024-10-21: 150 mg via INTRAVENOUS
  Filled 2024-10-21: qty 25

## 2024-10-21 MED ORDER — FAMOTIDINE IN NACL 20-0.9 MG/50ML-% IV SOLN
20.0000 mg | Freq: Once | INTRAVENOUS | Status: AC
  Administered 2024-10-21: 20 mg via INTRAVENOUS
  Filled 2024-10-21: qty 50

## 2024-10-21 MED ORDER — SODIUM CHLORIDE 0.9 % IV SOLN
202.2000 mg | Freq: Once | INTRAVENOUS | Status: AC
  Administered 2024-10-21: 200 mg via INTRAVENOUS
  Filled 2024-10-21: qty 20

## 2024-10-21 MED ORDER — PALONOSETRON HCL INJECTION 0.25 MG/5ML
0.2500 mg | Freq: Once | INTRAVENOUS | Status: AC
  Administered 2024-10-21: 0.25 mg via INTRAVENOUS
  Filled 2024-10-21: qty 5

## 2024-10-21 MED ORDER — DIPHENHYDRAMINE HCL 50 MG/ML IJ SOLN
50.0000 mg | Freq: Once | INTRAMUSCULAR | Status: AC
  Administered 2024-10-21: 50 mg via INTRAVENOUS
  Filled 2024-10-21: qty 1

## 2024-10-21 NOTE — Patient Instructions (Signed)

## 2024-10-22 ENCOUNTER — Inpatient Hospital Stay

## 2024-10-22 ENCOUNTER — Other Ambulatory Visit: Payer: Self-pay

## 2024-10-23 ENCOUNTER — Inpatient Hospital Stay

## 2024-10-23 ENCOUNTER — Other Ambulatory Visit: Payer: Self-pay

## 2024-10-24 ENCOUNTER — Inpatient Hospital Stay

## 2024-10-27 ENCOUNTER — Inpatient Hospital Stay: Admitting: Adult Health

## 2024-10-27 ENCOUNTER — Inpatient Hospital Stay

## 2024-10-27 ENCOUNTER — Encounter: Payer: Self-pay | Admitting: Adult Health

## 2024-10-27 VITALS — BP 140/75 | HR 66 | Temp 97.9°F | Resp 17 | Wt 268.3 lb

## 2024-10-27 VITALS — BP 122/61 | HR 68 | Resp 16

## 2024-10-27 DIAGNOSIS — Z170 Estrogen receptor positive status [ER+]: Secondary | ICD-10-CM

## 2024-10-27 DIAGNOSIS — Z5111 Encounter for antineoplastic chemotherapy: Secondary | ICD-10-CM | POA: Diagnosis not present

## 2024-10-27 DIAGNOSIS — C50511 Malignant neoplasm of lower-outer quadrant of right female breast: Secondary | ICD-10-CM

## 2024-10-27 LAB — CMP (CANCER CENTER ONLY)
ALT: <5 U/L (ref 0–44)
AST: 14 U/L — ABNORMAL LOW (ref 15–41)
Albumin: 4.1 g/dL (ref 3.5–5.0)
Alkaline Phosphatase: 69 U/L (ref 38–126)
Anion gap: 8 (ref 5–15)
BUN: 15 mg/dL (ref 6–20)
CO2: 27 mmol/L (ref 22–32)
Calcium: 9.4 mg/dL (ref 8.9–10.3)
Chloride: 102 mmol/L (ref 98–111)
Creatinine: 0.71 mg/dL (ref 0.44–1.00)
GFR, Estimated: >60 mL/min
Glucose, Bld: 79 mg/dL (ref 70–99)
Potassium: 4.5 mmol/L (ref 3.5–5.1)
Sodium: 137 mmol/L (ref 135–145)
Total Bilirubin: 0.5 mg/dL (ref 0.0–1.2)
Total Protein: 6.4 g/dL — ABNORMAL LOW (ref 6.5–8.1)

## 2024-10-27 LAB — CBC WITH DIFFERENTIAL (CANCER CENTER ONLY)
Abs Immature Granulocytes: 0.02 K/uL (ref 0.00–0.07)
Basophils Absolute: 0 K/uL (ref 0.0–0.1)
Basophils Relative: 1 %
Eosinophils Absolute: 0 K/uL (ref 0.0–0.5)
Eosinophils Relative: 1 %
HCT: 34.6 % — ABNORMAL LOW (ref 36.0–46.0)
Hemoglobin: 11.4 g/dL — ABNORMAL LOW (ref 12.0–15.0)
Immature Granulocytes: 1 %
Lymphocytes Relative: 36 %
Lymphs Abs: 1.3 K/uL (ref 0.7–4.0)
MCH: 28.6 pg (ref 26.0–34.0)
MCHC: 32.9 g/dL (ref 30.0–36.0)
MCV: 86.7 fL (ref 80.0–100.0)
Monocytes Absolute: 0.2 K/uL (ref 0.1–1.0)
Monocytes Relative: 6 %
Neutro Abs: 2 K/uL (ref 1.7–7.7)
Neutrophils Relative %: 55 %
Platelet Count: 208 K/uL (ref 150–400)
RBC: 3.99 MIL/uL (ref 3.87–5.11)
RDW: 14.8 % (ref 11.5–15.5)
WBC Count: 3.6 K/uL — ABNORMAL LOW (ref 4.0–10.5)
nRBC: 0 % (ref 0.0–0.2)

## 2024-10-27 MED ORDER — SODIUM CHLORIDE 0.9 % IV SOLN
202.2000 mg | Freq: Once | INTRAVENOUS | Status: AC
  Administered 2024-10-27: 200 mg via INTRAVENOUS
  Filled 2024-10-27: qty 20

## 2024-10-27 MED ORDER — DIPHENHYDRAMINE HCL 50 MG/ML IJ SOLN
50.0000 mg | Freq: Once | INTRAMUSCULAR | Status: AC
  Administered 2024-10-27: 50 mg via INTRAVENOUS
  Filled 2024-10-27: qty 1

## 2024-10-27 MED ORDER — DEXAMETHASONE SOD PHOSPHATE PF 10 MG/ML IJ SOLN
10.0000 mg | Freq: Once | INTRAMUSCULAR | Status: AC
  Administered 2024-10-27: 10 mg via INTRAVENOUS
  Filled 2024-10-27: qty 1

## 2024-10-27 MED ORDER — SODIUM CHLORIDE 0.9 % IV SOLN: INTRAVENOUS | Status: DC

## 2024-10-27 MED ORDER — SODIUM CHLORIDE 0.9 % IV SOLN
65.0000 mg/m2 | Freq: Once | INTRAVENOUS | Status: AC
  Administered 2024-10-27: 150 mg via INTRAVENOUS
  Filled 2024-10-27: qty 25

## 2024-10-27 MED ORDER — PALONOSETRON HCL INJECTION 0.25 MG/5ML
0.2500 mg | Freq: Once | INTRAVENOUS | Status: AC
  Administered 2024-10-27: 0.25 mg via INTRAVENOUS
  Filled 2024-10-27: qty 5

## 2024-10-27 MED ORDER — FAMOTIDINE IN NACL 20-0.9 MG/50ML-% IV SOLN
20.0000 mg | Freq: Once | INTRAVENOUS | Status: AC
  Administered 2024-10-27: 20 mg via INTRAVENOUS
  Filled 2024-10-27: qty 50

## 2024-10-27 NOTE — Patient Instructions (Signed)
 [facility] - A DEPT OF [facility]  Discharge Instructions: Thank you for choosing [facility] to provide your oncology and hematology care.   If you have a lab appointment with the Cancer Center, please go directly to the Cancer Center and check in at the registration area.   Wear comfortable clothing and clothing appropriate for easy access to any Portacath or PICC line.   We strive to give you quality time with your provider. You may need to reschedule your appointment if you arrive late (15 or more minutes).  Arriving late affects you and other patients whose appointments are after yours.  Also, if you miss three or more appointments without notifying the office, you may be dismissed from the clinic at the providers discretion.      For prescription refill requests, have your pharmacy contact our office and allow 72 hours for refills to be completed.    Today you received the following chemotherapy and/or immunotherapy agents: Paclitaxel  (Taxol ) and Carboplatin  (Paraplatin )      To help prevent nausea and vomiting after your treatment, we encourage you to take your nausea medication as directed.  BELOW ARE SYMPTOMS THAT SHOULD BE REPORTED IMMEDIATELY: *FEVER GREATER THAN 100.4 F (38 C) OR HIGHER *CHILLS OR SWEATING *NAUSEA AND VOMITING THAT IS NOT CONTROLLED WITH YOUR NAUSEA MEDICATION *UNUSUAL SHORTNESS OF BREATH *UNUSUAL BRUISING OR BLEEDING *URINARY PROBLEMS (pain or burning when urinating, or frequent urination) *BOWEL PROBLEMS (unusual diarrhea, constipation, pain near the anus) TENDERNESS IN MOUTH AND THROAT WITH OR WITHOUT PRESENCE OF ULCERS (sore throat, sores in mouth, or a toothache) UNUSUAL RASH, SWELLING OR PAIN  UNUSUAL VAGINAL DISCHARGE OR ITCHING   Items with * indicate a potential emergency and should be followed up as soon as possible or go to the Emergency Department if any problems should occur.  Please show the  CHEMOTHERAPY ALERT CARD or IMMUNOTHERAPY ALERT CARD at check-in to the Emergency Department and triage nurse.  Should you have questions after your visit or need to cancel or reschedule your appointment, please contact [facility] - A DEPT OF [facility]  Dept: [phone]  and follow the prompts.  Office hours are 8:00 a.m. to 4:30 p.m. Monday - Friday. Please note that voicemails left after 4:00 p.m. may not be returned until the following business day.  We are closed weekends and major holidays. You have access to a nurse at all times for urgent questions. Please call the main number to the clinic Dept: [phone] and follow the prompts.   For any non-urgent questions, you may also contact your provider using MyChart. We now offer e-Visits for anyone 95 and older to request care online for non-urgent symptoms. For details visit [email or web address].   Also download the MyChart app! Go to the app store, search MyChart, open the app, select [facility], and log in with your MyChart username and password.

## 2024-10-27 NOTE — Progress Notes (Unsigned)
 [facility] Cancer Follow up:    Alicia Pellet, MD [phone] [address] [place] [address]   DIAGNOSIS:  Cancer Staging  Malignant neoplasm of lower-outer quadrant of right breast of female, estrogen receptor positive (HCC) Staging form: Breast, AJCC 8th Edition - Clinical: Stage IIB (cT1c, cN1, cM0, G3, ER-, PR-, HER2-) - Signed by Alicia Ash, MD on 08/26/2024 Stage prefix: Initial diagnosis Histologic grading system: 3 grade system    SUMMARY OF ONCOLOGIC HISTORY: Oncology History  Malignant neoplasm of lower-outer quadrant of right breast of female, estrogen receptor positive (HCC)  08/21/2024 Initial Diagnosis   Malignant neoplasm of lower-outer quadrant of right breast of female, estrogen receptor positive (HCC)   08/26/2024 Cancer Staging   Staging form: Breast, AJCC 8th Edition - Clinical: Stage IIB (cT1c, cN1, cM0, G3, ER-, PR-, HER2-) - Signed by Alicia Ash, MD on 08/26/2024 Stage prefix: Initial diagnosis Histologic grading system: 3 grade system   09/05/2024 Genetic Testing   Negative [facility] CancerNext- Expanded + RNA insight. Report date 09/05/2024.  [facility] CancerNext-Expanded + RNAinsight gene panel includes sequencing, rearrangement, and RNA analysis for the following 77 genes: AIP, ALK, APC, ATM, AXIN2, BAP1, BARD1, BMPR1A, BRCA1, BRCA2, BRIP1, CDC73, CDH1, CDK4, CDKN1B, CDKN2A, CEBPA, CHEK2, CTNNA1, DDX41, DICER1, ETV6, FH, FLCN, GATA2, LZTR1, MAX, MBD4, MEN1, MET, MLH1, MSH2, MSH3, MSH6, MUTYH, NF1, NF2, NTHL1, PALB2, PHOX2B, PMS2, POT1, PRKAR1A, PTCH1, PTEN, RAD51C, RAD51D, RB1, RET, RPS20, RUNX1, SDHA, SDHAF2, SDHB, SDHC, SDHD, SMAD4, SMARCA4, SMARCB1, SMARCE1, STK11, SUFU, TMEM127, TP53, TSC1, TSC2, VHL, and WT1 (sequencing and deletion/duplication); EGFR, HOXB13, KIT, MITF, PDGFRA, POLD1, and POLE (sequencing only); EPCAM and GREM1 (deletion/duplication only).     09/17/2024 -  Chemotherapy   Patient is on Treatment Plan :  BREAST Pembrolizumab  (200) D1 + Carboplatin  (1.5) D1,8,15 + Paclitaxel  (80) D1,8,15 q21d X 4 cycles / Pembrolizumab  (200) D1 + AC D1 q21d x 4 cycles       CURRENT THERAPY:  INTERVAL HISTORY:  Discussed the use of AI scribe software for clinical note transcription with the patient, who gave verbal consent to proceed.  History of Present Illness      Patient Active Problem List   Diagnosis Date Noted   Encounter for antineoplastic chemotherapy 10/21/2024   Genetic testing 09/07/2024   Family history of non-Hodgkin lymphoma    Family history of melanoma    Family history of liver cancer    Family history of colon cancer    Family history of breast cancer    Family history of brain tumor    Malignant neoplasm of lower-outer quadrant of right breast of female, estrogen receptor positive (HCC) 08/21/2024   Abdominal pain, epigastric 03/18/2020   Neuralgia, postherpetic 05/31/2016   Super obesity 05/31/2016   Cough 08/10/2013   DIARRHEA 02/25/2009   PANIC DISORDER 02/24/2009   HYPERTENSION 02/24/2009   GERD 02/24/2009   HIATAL HERNIA 02/24/2009   DIVERTICULOSIS, COLON 02/24/2009   IRRITABLE BOWEL SYNDROME 02/24/2009   DEGENERATIVE JOINT DISEASE 02/24/2009   HEADACHE, CHRONIC 02/24/2009   URINARY INCONTINENCE 02/24/2009   ALLERGY 02/24/2009    is allergic to codeine, hibiclens  [chlorhexidine  gluconate], hydrocodone -acetaminophen , oxycodone , tramadol , tramadol  hcl, albuterol, and lexapro [escitalopram oxalate].  MEDICAL HISTORY: Past Medical History:  Diagnosis Date   Cystitis, interstitial    Diverticulosis of colon (without mention of hemorrhage)    DJD (degenerative joint disease)    Family history of brain tumor    Family history of breast cancer    Family  history of colon cancer    Family history of liver cancer    Family history of melanoma    Family history of non-Hodgkin lymphoma    Fatty liver    GERD (gastroesophageal  reflux disease)    Headache(784.0)    Hiatal hernia    Internal hemorrhoids    Irritable bowel syndrome    Miscarriage    Panic disorder    PONV (postoperative nausea and vomiting)    Tubular adenoma of colon 2010   Unspecified essential hypertension    Unspecified urinary incontinence     SURGICAL HISTORY: Past Surgical History:  Procedure Laterality Date   ABDOMINAL HYSTERECTOMY     BREAST BIOPSY Right 08/17/2024   US  RT BREAST BX W LOC DEV 1ST LESION IMG BX SPEC US  GUIDE 08/17/2024 [facility] MAMMOGRAPHY   COLONOSCOPY N/A 12/29/2012   Procedure: COLONOSCOPY;  Surgeon: Alicia JONELLE Gander, MD;  Location: [facility];  Service: Endoscopy;  Laterality: N/A;   deviated septrum     NASAL SINUS SURGERY     NISSEN FUNDOPLICATION     PORTACATH PLACEMENT Left 09/14/2024   Procedure: INSERTION, TUNNELED CENTRAL VENOUS DEVICE, WITH PORT;  Surgeon: Curvin Deward MOULD, MD;  Location: [facility] OR;  Service: General;  Laterality: Left;  WITH ULTRASOUND GUIDANCE   SKIN GRAFT     right ear   TYMPANOSTOMY TUBE PLACEMENT     x 10    SOCIAL HISTORY: Social History   Socioeconomic History   Marital status: Married    Spouse name: Not on file   Number of children: 0   Years of education: Not on file   Highest education level: Not on file  Occupational History   Occupation: housewife  Tobacco Use   Smoking status: Former    Types: Cigarettes   Smokeless tobacco: Never   Tobacco comments:    college years  Vaping Use   Vaping status: Never Used  Substance and Sexual Activity   Alcohol use: No   Drug use: No   Sexual activity: Not on file  Other Topics Concern   Not on file  Social History Narrative   Adopted daughter   Social Drivers of Health   Tobacco Use: Medium Risk (10/27/2024)   Patient History    Smoking Tobacco Use: Former    Smokeless Tobacco Use: Never    Passive Exposure: Not on Actuary Strain: Not on file  Food Insecurity:  No Food Insecurity (10/27/2024)   Epic    Worried About Programme Researcher, Broadcasting/film/video in the Last Year: Never true    Ran Out of Food in the Last Year: Never true  Transportation Needs: No Transportation Needs (10/27/2024)   Epic    Lack of Transportation (Medical): No    Lack of Transportation (Non-Medical): No  Physical Activity: Inactive (10/27/2024)   Exercise Vital Sign    Days of Exercise per Week: 0 days    Minutes of Exercise per Session: 0 min  Stress: Not on file  Social Connections: Moderately Integrated (10/27/2024)   Social Connection and Isolation Panel    Frequency of Communication with Friends and Family: More than three times a week    Frequency of Social Gatherings with Friends and Family: More than three times a week    Attends Religious Services: 1 to 4 times per year    Active Member of [employer] or Organizations: No    Attends Banker Meetings: Never    Marital Status: Married  Intimate  Partner Violence: Not At Risk (10/27/2024)   Epic    Fear of Current or Ex-Partner: No    Emotionally Abused: No    Physically Abused: No    Sexually Abused: No  Depression (PHQ2-9): Low Risk (10/27/2024)   Depression (PHQ2-9)    PHQ-2 Score: 0  Alcohol Screen: Low Risk (10/27/2024)   Alcohol Screen    Last Alcohol Screening Score (AUDIT): 0  Housing: Unknown (10/27/2024)   Epic    Unable to Pay for Housing in the Last Year: No    Number of Times Moved in the Last Year: Not on file    Homeless in the Last Year: No  Utilities: Not At Risk (10/27/2024)   Epic    Threatened with loss of utilities: No  Health Literacy: Adequate Health Literacy (10/27/2024)   B1300 Health Literacy    Frequency of need for help with medical instructions: Never    FAMILY HISTORY: Family History  Problem Relation Age of Onset   Heart disease Mother    Non-Hodgkin's lymphoma Mother    Stroke Mother    Hypertension Mother    Deep vein thrombosis Mother    Diabetes  Father    Heart disease Father    Dementia Father    Stroke Father    Melanoma Father        mets to lymph nodes   Hypertension Father    Pancreatic cancer Paternal Uncle    Stomach cancer Paternal Uncle        mets from pancreas   Pneumonia Maternal Grandmother    Other Maternal Grandfather        esophageal problems   Stroke Paternal Grandmother    Colon cancer Neg Hx    Esophageal cancer Neg Hx     Review of Systems  Constitutional:  Negative for appetite change, chills, fatigue, fever and unexpected weight change.  HENT:   Negative for hearing loss, lump/mass and trouble swallowing.   Eyes:  Negative for eye problems and icterus.  Respiratory:  Negative for chest tightness, cough and shortness of breath.   Cardiovascular:  Negative for chest pain, leg swelling and palpitations.  Gastrointestinal:  Negative for abdominal distention, abdominal pain, constipation, diarrhea, nausea and vomiting.  Endocrine: Negative for hot flashes.  Genitourinary:  Negative for difficulty urinating.   Musculoskeletal:  Negative for arthralgias.  Skin:  Negative for itching and rash.  Neurological:  Negative for dizziness, extremity weakness, headaches and numbness.  Hematological:  Negative for adenopathy. Does not bruise/bleed easily.  Psychiatric/Behavioral:  Negative for depression. The patient is not nervous/anxious.       PHYSICAL EXAMINATION    Vitals:   10/27/24 1203  BP: (!) 140/75  Pulse: 66  Resp: 17  Temp: 97.9 F (36.6 C)  SpO2: 100%    Physical Exam Constitutional:      General: She is not in acute distress.    Appearance: Normal appearance. She is not toxic-appearing.  HENT:     Head: Normocephalic and atraumatic.     Mouth/Throat:     Mouth: Mucous membranes are moist.     Pharynx: Oropharynx is clear. No oropharyngeal exudate or posterior oropharyngeal erythema.  Eyes:     General: No scleral icterus. Cardiovascular:     Rate and Rhythm: Normal  rate and regular rhythm.     Pulses: Normal pulses.     Heart sounds: Normal heart sounds.  Pulmonary:     Effort: Pulmonary effort is normal.  Breath sounds: Normal breath sounds.  Abdominal:     General: Abdomen is flat. Bowel sounds are normal. There is no distension.     Palpations: Abdomen is soft.     Tenderness: There is no abdominal tenderness.  Musculoskeletal:        General: No swelling.     Cervical back: Neck supple.  Lymphadenopathy:     Cervical: No cervical adenopathy.  Skin:    General: Skin is warm and dry.     Findings: No rash.  Neurological:     General: No focal deficit present.     Mental Status: She is alert.  Psychiatric:        Mood and Affect: Mood normal.        Behavior: Behavior normal.     LABORATORY DATA:  CBC    Component Value Date/Time   WBC 3.6 (L) 10/27/2024 1116   WBC 6.0 01/29/2017 1128   RBC 3.99 10/27/2024 1116   HGB 11.4 (L) 10/27/2024 1116   HCT 34.6 (L) 10/27/2024 1116   PLT 208 10/27/2024 1116   MCV 86.7 10/27/2024 1116   MCH 28.6 10/27/2024 1116   MCHC 32.9 10/27/2024 1116   RDW 14.8 10/27/2024 1116   LYMPHSABS PENDING 10/27/2024 1116   MONOABS PENDING 10/27/2024 1116   EOSABS PENDING 10/27/2024 1116   BASOSABS PENDING 10/27/2024 1116    CMP     Component Value Date/Time   NA 137 10/27/2024 1116   K 4.5 10/27/2024 1116   CL 102 10/27/2024 1116   CO2 27 10/27/2024 1116   GLUCOSE 79 10/27/2024 1116   BUN 15 10/27/2024 1116   CREATININE 0.71 10/27/2024 1116   CALCIUM 9.4 10/27/2024 1116   PROT 6.4 (L) 10/27/2024 1116   ALBUMIN 4.1 10/27/2024 1116   AST 14 (L) 10/27/2024 1116   ALT <5 10/27/2024 1116   ALKPHOS 69 10/27/2024 1116   BILITOT 0.5 10/27/2024 1116   GFRNONAA >60 10/27/2024 1116   GFRAA 141 12/19/2006 0959     ASSESSMENT and THERAPY PLAN:   No problem-specific Assessment & Plan notes found for this encounter.   Assessment and Plan Assessment & Plan       All questions were  answered. The patient knows to call the clinic with any problems, questions or concerns. We can certainly see the patient much sooner if necessary.  Total encounter time:*** minutes*in face-to-face visit time, chart review, lab review, care coordination, order entry, and documentation of the encounter time.    Morna Kendall, NP 10/27/24 12:18 PM Medical Oncology and Hematology [facility] [address] [place], [place] [address] Tel. [phone]    Fax. [phone]  *Total Encounter Time as defined by the Centers for Medicare and Medicaid Services includes, in addition to the face-to-face time of a patient visit (documented in the note above) non-face-to-face time: obtaining and reviewing outside history, ordering and reviewing medications, tests or procedures, care coordination (communications with other health care professionals or caregivers) and documentation in the medical record.

## 2024-10-28 ENCOUNTER — Inpatient Hospital Stay: Admitting: Adult Health

## 2024-10-28 ENCOUNTER — Inpatient Hospital Stay

## 2024-10-28 ENCOUNTER — Encounter: Payer: Self-pay | Admitting: Hematology and Oncology

## 2024-10-28 VITALS — BP 137/87 | HR 70 | Temp 98.3°F | Resp 16

## 2024-10-28 DIAGNOSIS — C50511 Malignant neoplasm of lower-outer quadrant of right female breast: Secondary | ICD-10-CM

## 2024-10-28 DIAGNOSIS — Z5111 Encounter for antineoplastic chemotherapy: Secondary | ICD-10-CM | POA: Diagnosis not present

## 2024-10-28 MED ORDER — FILGRASTIM-SNDZ 480 MCG/0.8ML IJ SOSY
480.0000 ug | PREFILLED_SYRINGE | Freq: Once | INTRAMUSCULAR | Status: AC
  Administered 2024-10-28: 480 ug via SUBCUTANEOUS
  Filled 2024-10-28: qty 0.8

## 2024-10-29 ENCOUNTER — Inpatient Hospital Stay

## 2024-10-29 ENCOUNTER — Encounter: Payer: Self-pay | Admitting: Hematology and Oncology

## 2024-10-29 ENCOUNTER — Encounter: Payer: Self-pay | Admitting: Adult Health

## 2024-10-29 VITALS — BP 126/69 | HR 65 | Temp 98.6°F | Resp 18

## 2024-10-29 DIAGNOSIS — C50511 Malignant neoplasm of lower-outer quadrant of right female breast: Secondary | ICD-10-CM

## 2024-10-29 DIAGNOSIS — Z5111 Encounter for antineoplastic chemotherapy: Secondary | ICD-10-CM | POA: Diagnosis not present

## 2024-10-29 MED ORDER — FILGRASTIM-SNDZ 480 MCG/0.8ML IJ SOSY
480.0000 ug | PREFILLED_SYRINGE | Freq: Once | INTRAMUSCULAR | Status: AC
  Administered 2024-10-29: 480 ug via SUBCUTANEOUS
  Filled 2024-10-29: qty 0.8

## 2024-10-30 ENCOUNTER — Inpatient Hospital Stay: Admitting: Hematology and Oncology

## 2024-10-30 ENCOUNTER — Encounter: Payer: Self-pay | Admitting: General Practice

## 2024-10-30 ENCOUNTER — Inpatient Hospital Stay

## 2024-10-30 VITALS — BP 134/73 | HR 67 | Temp 98.5°F | Resp 17

## 2024-10-30 DIAGNOSIS — C50511 Malignant neoplasm of lower-outer quadrant of right female breast: Secondary | ICD-10-CM

## 2024-10-30 DIAGNOSIS — Z5111 Encounter for antineoplastic chemotherapy: Secondary | ICD-10-CM | POA: Diagnosis not present

## 2024-10-30 MED ORDER — FILGRASTIM-SNDZ 480 MCG/0.8ML IJ SOSY
480.0000 ug | PREFILLED_SYRINGE | Freq: Once | INTRAMUSCULAR | Status: AC
  Administered 2024-10-30: 480 ug via SUBCUTANEOUS
  Filled 2024-10-30: qty 0.8

## 2024-10-30 NOTE — Progress Notes (Signed)
 [facility] Spiritual Care Note  Reached Alicia Bentley briefly by phone per referral from [facility]/NP. Alicia Bentley requests a follow-up phone call to speak in more detail, because she gets groggy in infusion and prefers peace and quiet during treatment. Weather permitting, we plan to follow up by phone on Monday 1/26.  [address] Alicia Bentley, [place], [facility] Pager [phone] Voicemail [phone]

## 2024-11-02 ENCOUNTER — Encounter: Payer: Self-pay | Admitting: *Deleted

## 2024-11-02 ENCOUNTER — Encounter: Payer: Self-pay | Admitting: General Practice

## 2024-11-02 NOTE — Progress Notes (Signed)
 [facility] Spiritual Care Note  Attempted follow-up call, but voicemail full. Plan to try again tomorrow.  [address] Olam Corrigan, [place], [facility] Pager [phone] Voicemail [phone]

## 2024-11-03 ENCOUNTER — Encounter: Payer: Self-pay | Admitting: General Practice

## 2024-11-03 NOTE — Progress Notes (Signed)
 [facility] Spiritual Care Note  Reached Alicia Bentley by phone per referral for additional layer of support. She was not feeling well, so we limited the call to introductions, sharing contact information, and discussing ongoing chaplain availability via phone/video appointment/office visit.  [address] Olam Corrigan, [place], [facility] Pager [phone] Voicemail [phone]

## 2024-11-03 NOTE — Progress Notes (Signed)
 [facility] Spiritual Care Note  Voicemail still full. Will continue trying.  [address] Alicia Bentley, [place], [facility] Pager [phone] Voicemail [phone]

## 2024-11-04 ENCOUNTER — Inpatient Hospital Stay: Admitting: Hematology and Oncology

## 2024-11-04 ENCOUNTER — Inpatient Hospital Stay

## 2024-11-04 VITALS — BP 136/68 | HR 71

## 2024-11-04 VITALS — BP 130/78 | HR 68 | Temp 98.1°F | Resp 17 | Ht 64.0 in | Wt 268.0 lb

## 2024-11-04 DIAGNOSIS — Z170 Estrogen receptor positive status [ER+]: Secondary | ICD-10-CM | POA: Diagnosis not present

## 2024-11-04 DIAGNOSIS — G620 Drug-induced polyneuropathy: Secondary | ICD-10-CM | POA: Diagnosis not present

## 2024-11-04 DIAGNOSIS — C50511 Malignant neoplasm of lower-outer quadrant of right female breast: Secondary | ICD-10-CM | POA: Diagnosis not present

## 2024-11-04 DIAGNOSIS — T451X5A Adverse effect of antineoplastic and immunosuppressive drugs, initial encounter: Secondary | ICD-10-CM

## 2024-11-04 DIAGNOSIS — Z5111 Encounter for antineoplastic chemotherapy: Secondary | ICD-10-CM | POA: Diagnosis not present

## 2024-11-04 LAB — CBC WITH DIFFERENTIAL (CANCER CENTER ONLY)
Abs Immature Granulocytes: 0.03 10*3/uL (ref 0.00–0.07)
Basophils Absolute: 0 10*3/uL (ref 0.0–0.1)
Basophils Relative: 1 %
Eosinophils Absolute: 0 10*3/uL (ref 0.0–0.5)
Eosinophils Relative: 1 %
HCT: 35.2 % — ABNORMAL LOW (ref 36.0–46.0)
Hemoglobin: 11.6 g/dL — ABNORMAL LOW (ref 12.0–15.0)
Immature Granulocytes: 1 %
Lymphocytes Relative: 40 %
Lymphs Abs: 1.5 10*3/uL (ref 0.7–4.0)
MCH: 28.9 pg (ref 26.0–34.0)
MCHC: 33 g/dL (ref 30.0–36.0)
MCV: 87.6 fL (ref 80.0–100.0)
Monocytes Absolute: 0.3 10*3/uL (ref 0.1–1.0)
Monocytes Relative: 9 %
Neutro Abs: 1.7 10*3/uL (ref 1.7–7.7)
Neutrophils Relative %: 48 %
Platelet Count: 180 10*3/uL (ref 150–400)
RBC: 4.02 MIL/uL (ref 3.87–5.11)
RDW: 15.6 % — ABNORMAL HIGH (ref 11.5–15.5)
WBC Count: 3.6 10*3/uL — ABNORMAL LOW (ref 4.0–10.5)
nRBC: 0 % (ref 0.0–0.2)

## 2024-11-04 LAB — CMP (CANCER CENTER ONLY)
ALT: <5 U/L (ref 0–44)
AST: 14 U/L — ABNORMAL LOW (ref 15–41)
Albumin: 4.2 g/dL (ref 3.5–5.0)
Alkaline Phosphatase: 84 U/L (ref 38–126)
Anion gap: 10 (ref 5–15)
BUN: 14 mg/dL (ref 6–20)
CO2: 25 mmol/L (ref 22–32)
Calcium: 9.6 mg/dL (ref 8.9–10.3)
Chloride: 99 mmol/L (ref 98–111)
Creatinine: 0.74 mg/dL (ref 0.44–1.00)
GFR, Estimated: >60 mL/min
Glucose, Bld: 92 mg/dL (ref 70–99)
Potassium: 4 mmol/L (ref 3.5–5.1)
Sodium: 135 mmol/L (ref 135–145)
Total Bilirubin: 0.4 mg/dL (ref 0.0–1.2)
Total Protein: 6.5 g/dL (ref 6.5–8.1)

## 2024-11-04 LAB — T4, FREE: Free T4: 1.25 ng/dL (ref 0.80–2.00)

## 2024-11-04 LAB — TSH: TSH: 1.92 u[IU]/mL (ref 0.350–4.500)

## 2024-11-04 MED ORDER — SODIUM CHLORIDE 0.9 % IV SOLN
200.0000 mg | Freq: Once | INTRAVENOUS | Status: AC
  Administered 2024-11-04: 200 mg via INTRAVENOUS
  Filled 2024-11-04: qty 200

## 2024-11-04 MED ORDER — SODIUM CHLORIDE 0.9 % IV SOLN
65.0000 mg/m2 | Freq: Once | INTRAVENOUS | Status: AC
  Administered 2024-11-04: 150 mg via INTRAVENOUS
  Filled 2024-11-04: qty 25

## 2024-11-04 MED ORDER — AMOXICILLIN-POT CLAVULANATE 875-125 MG PO TABS: 1.0000 | ORAL_TABLET | Freq: Two times a day (BID) | ORAL | 0 refills | Status: AC

## 2024-11-04 MED ORDER — PALONOSETRON HCL INJECTION 0.25 MG/5ML
0.2500 mg | Freq: Once | INTRAVENOUS | Status: AC
  Administered 2024-11-04: 0.25 mg via INTRAVENOUS
  Filled 2024-11-04: qty 5

## 2024-11-04 MED ORDER — DIPHENHYDRAMINE HCL 50 MG/ML IJ SOLN
50.0000 mg | Freq: Once | INTRAMUSCULAR | Status: AC
  Administered 2024-11-04: 50 mg via INTRAVENOUS
  Filled 2024-11-04: qty 1

## 2024-11-04 MED ORDER — SODIUM CHLORIDE 0.9 % IV SOLN
202.2000 mg | Freq: Once | INTRAVENOUS | Status: AC
  Administered 2024-11-04: 200 mg via INTRAVENOUS
  Filled 2024-11-04: qty 20

## 2024-11-04 MED ORDER — DEXAMETHASONE SOD PHOSPHATE PF 10 MG/ML IJ SOLN
10.0000 mg | Freq: Once | INTRAMUSCULAR | Status: AC
  Administered 2024-11-04: 10 mg via INTRAVENOUS
  Filled 2024-11-04: qty 1

## 2024-11-04 MED ORDER — FAMOTIDINE IN NACL 20-0.9 MG/50ML-% IV SOLN
20.0000 mg | Freq: Once | INTRAVENOUS | Status: AC
  Administered 2024-11-04: 20 mg via INTRAVENOUS
  Filled 2024-11-04: qty 50

## 2024-11-04 MED ORDER — SODIUM CHLORIDE 0.9 % IV SOLN: INTRAVENOUS | Status: DC

## 2024-11-04 NOTE — Patient Instructions (Signed)
 [facility] - A DEPT OF [facility]  Discharge Instructions: Thank you for choosing [facility] to provide your oncology and hematology care.   If you have a lab appointment with the Cancer Center, please go directly to the Cancer Center and check in at the registration area.   Wear comfortable clothing and clothing appropriate for easy access to any Portacath or PICC line.   We strive to give you quality time with your provider. You may need to reschedule your appointment if you arrive late (15 or more minutes).  Arriving late affects you and other patients whose appointments are after yours.  Also, if you miss three or more appointments without notifying the office, you may be dismissed from the clinic at the providers discretion.      For prescription refill requests, have your pharmacy contact our office and allow 72 hours for refills to be completed.    Today you received the following chemotherapy and/or immunotherapy agents keytruda  taxol  carboplatin       To help prevent nausea and vomiting after your treatment, we encourage you to take your nausea medication as directed.  BELOW ARE SYMPTOMS THAT SHOULD BE REPORTED IMMEDIATELY: *FEVER GREATER THAN 100.4 F (38 C) OR HIGHER *CHILLS OR SWEATING *NAUSEA AND VOMITING THAT IS NOT CONTROLLED WITH YOUR NAUSEA MEDICATION *UNUSUAL SHORTNESS OF BREATH *UNUSUAL BRUISING OR BLEEDING *URINARY PROBLEMS (pain or burning when urinating, or frequent urination) *BOWEL PROBLEMS (unusual diarrhea, constipation, pain near the anus) TENDERNESS IN MOUTH AND THROAT WITH OR WITHOUT PRESENCE OF ULCERS (sore throat, sores in mouth, or a toothache) UNUSUAL RASH, SWELLING OR PAIN  UNUSUAL VAGINAL DISCHARGE OR ITCHING   Items with * indicate a potential emergency and should be followed up as soon as possible or go to the Emergency Department if any problems should occur.  Please show the CHEMOTHERAPY ALERT CARD  or IMMUNOTHERAPY ALERT CARD at check-in to the Emergency Department and triage nurse.  Should you have questions after your visit or need to cancel or reschedule your appointment, please contact [facility] - A DEPT OF [facility]  Dept: [phone]  and follow the prompts.  Office hours are 8:00 a.m. to 4:30 p.m. Monday - Friday. Please note that voicemails left after 4:00 p.m. may not be returned until the following business day.  We are closed weekends and major holidays. You have access to a nurse at all times for urgent questions. Please call the main number to the clinic Dept: [phone] and follow the prompts.   For any non-urgent questions, you may also contact your provider using MyChart. We now offer e-Visits for anyone 42 and older to request care online for non-urgent symptoms. For details visit [email or web address].   Also download the MyChart app! Go to the app store, search MyChart, open the app, select [place], and log in with your MyChart username and password.

## 2024-11-04 NOTE — Progress Notes (Signed)
 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 - Ok to continue current regimen. - chemo induced neuropathy significantly improved with reduced dose of taxol  - Reassured her about the excellent clinical response so far. - She absolutely denies any new toxicity since last visit - She is very motivated to continue current regimen - FU as scheduled.    HISTORY OF PRESENTING ILLNESS:  Alicia Bentley 59 y.o. female is here because of new diagnosis of right breast cancer  Discussed the use of AI scribe software for clinical note transcription with the patient, who gave verbal consent to proceed.  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  She is currently receiving weekly paclitaxel  (with recent dose reduction), low-dose weekly carboplatin , and pembrolizumab  every 21 days. She is doing significantly well compared to her last visit. She denies any new toxicity at all. Neuropathy significantly better. She is very motivated to continue current treatment.  All other systems were reviewed with the patient and are negative.  MEDICAL HISTORY:  Past Medical History:  Diagnosis Date   Cystitis, interstitial     Diverticulosis of colon (without mention of hemorrhage)    DJD (degenerative joint disease)    Family history of brain tumor    Family history of breast cancer    Family history of colon cancer    Family history of liver cancer    Family history of melanoma    Family history of non-Hodgkin lymphoma    Fatty liver    GERD (gastroesophageal reflux disease)    Headache(784.0)    Hiatal hernia    Internal hemorrhoids    Irritable bowel syndrome    Miscarriage    Panic disorder    PONV (postoperative nausea and vomiting)    Tubular adenoma of colon 2010   Unspecified essential hypertension    Unspecified urinary incontinence     SURGICAL HISTORY: Past Surgical History:  Procedure Laterality Date   ABDOMINAL HYSTERECTOMY     BREAST BIOPSY Right 08/17/2024   US  RT BREAST BX W LOC DEV 1ST LESION IMG BX SPEC US  GUIDE 08/17/2024 [facility] MAMMOGRAPHY   COLONOSCOPY N/A 12/29/2012   Procedure: COLONOSCOPY;  Surgeon: Alm JONELLE Gander, MD;  Location: [facility];  Service: Endoscopy;  Laterality: N/A;   deviated septrum     NASAL SINUS SURGERY     NISSEN FUNDOPLICATION     PORTACATH PLACEMENT Left 09/14/2024   Procedure: INSERTION, TUNNELED CENTRAL VENOUS DEVICE, WITH PORT;  Surgeon: Curvin Deward MOULD, MD;  Location: [facility] OR;  Service: General;  Laterality: Left;  WITH ULTRASOUND GUIDANCE   SKIN GRAFT     right ear   TYMPANOSTOMY TUBE PLACEMENT     x 10    SOCIAL HISTORY: Social History   Socioeconomic History   Marital status: Married    Spouse name: Not on file   Number of children:  0   Years of education: Not on file   Highest education level: Not on file  Occupational History   Occupation: housewife  Tobacco Use   Smoking status: Former    Types: Cigarettes   Smokeless tobacco: Never   Tobacco comments:    college years  Vaping Use   Vaping status: Never Used  Substance and Sexual Activity   Alcohol use: No   Drug use: No   Sexual activity: Not on file  Other Topics  Concern   Not on file  Social History Narrative   Adopted daughter   Social Drivers of Health   Tobacco Use: Medium Risk (10/29/2024)   Patient History    Smoking Tobacco Use: Former    Smokeless Tobacco Use: Never    Passive Exposure: Not on Actuary Strain: Not on file  Food Insecurity: No Food Insecurity (10/27/2024)   Epic    Worried About Programme Researcher, Broadcasting/film/video in the Last Year: Never true    Ran Out of Food in the Last Year: Never true  Transportation Needs: No Transportation Needs (10/27/2024)   Epic    Lack of Transportation (Medical): No    Lack of Transportation (Non-Medical): No  Physical Activity: Inactive (10/27/2024)   Exercise Vital Sign    Days of Exercise per Week: 0 days    Minutes of Exercise per Session: 0 min  Stress: Not on file  Social Connections: Moderately Integrated (10/27/2024)   Social Connection and Isolation Panel    Frequency of Communication with Friends and Family: More than three times a week    Frequency of Social Gatherings with Friends and Family: More than three times a week    Attends Religious Services: 1 to 4 times per year    Active Member of [employer] or Organizations: No    Attends Banker Meetings: Never    Marital Status: Married  Catering Manager Violence: Not At Risk (10/27/2024)   Epic    Fear of Current or Ex-Partner: No    Emotionally Abused: No    Physically Abused: No    Sexually Abused: No  Depression (PHQ2-9): Low Risk (11/04/2024)   Depression (PHQ2-9)    PHQ-2 Score: 0  Alcohol Screen: Low Risk (10/27/2024)   Alcohol Screen    Last Alcohol Screening Score (AUDIT): 0  Housing: Unknown (10/27/2024)   Epic    Unable to Pay for Housing in the Last Year: No    Number of Times Moved in the Last Year: Not on file    Homeless in the Last Year: No  Utilities: Not At Risk (10/27/2024)   Epic    Threatened with loss of utilities: No  Health Literacy: Adequate Health Literacy (10/27/2024)   B13[facility]  Literacy    Frequency of need for help with medical instructions: Never    FAMILY HISTORY: Family History  Problem Relation Age of Onset   Heart disease Mother    Non-Hodgkin's lymphoma Mother    Stroke Mother    Hypertension Mother    Deep vein thrombosis Mother    Diabetes Father    Heart disease Father    Dementia Father    Stroke Father    Melanoma Father        mets to lymph nodes   Hypertension Father    Pancreatic cancer Paternal Uncle    Stomach cancer Paternal Uncle        mets from pancreas   Pneumonia Maternal Grandmother  Other Maternal Grandfather        esophageal problems   Stroke Paternal Grandmother    Colon cancer Neg Hx    Esophageal cancer Neg Hx     ALLERGIES:  is allergic to codeine, hibiclens  [chlorhexidine  gluconate], hydrocodone -acetaminophen , oxycodone , tramadol , tramadol  hcl, albuterol, and lexapro [escitalopram oxalate].  MEDICATIONS:  Current Outpatient Medications  Medication Sig Dispense Refill   acetaminophen  (TYLENOL ) 650 MG CR tablet Take 2 tablets by mouth daily as needed.      aspirin 81 MG tablet Take 81 mg by mouth daily.     Azelastine-Fluticasone 137-50 MCG/ACT SUSP Place into the nose.     bisacodyl (DULCOLAX) 5 MG EC tablet Take 5 mg by mouth daily as needed for moderate constipation.     buPROPion (WELLBUTRIN XL) 150 MG 24 hr tablet Take 150 mg by mouth every morning.     Calcium Carb-Cholecalciferol (CALCIUM + VITAMIN D3 PO) Take 600 mg by mouth daily.     cetirizine (ZYRTEC) 10 MG tablet Take 10 mg by mouth daily.     ciprofloxacin  (CIPRO ) 500 MG tablet Take 1 tablet (500 mg total) by mouth 2 (two) times daily. 14 tablet 0   dexamethasone  (DECADRON ) 4 MG tablet Take 2 tablets daily for 2 days, start the day after chemotherapy. Take with food. 30 tablet 1   famotidine  (PEPCID ) 20 MG tablet Take 20 mg by mouth at bedtime.     hydrOXYzine (ATARAX) 25 MG tablet Take 25 mg by mouth 2 (two) times daily as needed.     hyoscyamine   (LEVSIN  SL) 0.125 MG SL tablet Place 1 tablet (0.125 mg total) under the tongue every 8 (eight) hours as needed. 30 tablet 1   lidocaine -prilocaine  (EMLA ) cream Apply to affected area once 30 g 3   linaclotide  (LINZESS ) 290 MCG CAPS capsule Take 1 capsule (290 mcg total) by mouth daily before breakfast. 90 capsule 3   losartan (COZAAR) 50 MG tablet Take 50 mg by mouth daily.     meloxicam (MOBIC) 15 MG tablet Take 15 mg by mouth daily.     metaxalone (SKELAXIN) 800 MG tablet Take 800 mg by mouth 3 (three) times daily.     metFORMIN (GLUCOPHAGE) 500 MG tablet Take 500 mg by mouth 2 (two) times daily with a meal.     Multiple Vitamins-Minerals (ONE-A-DAY WOMENS 50 PLUS PO) Take 1 capsule by mouth daily.     Omega-3 Fatty Acids (FISH OIL) 1200 MG CAPS Take 1 capsule by mouth daily.     ondansetron  (ZOFRAN ) 8 MG tablet Take 1 tablet (8 mg total) by mouth every 8 (eight) hours as needed for nausea or vomiting. Start on the third day after chemotherapy. 30 tablet 1   oxyCODONE  (ROXICODONE ) 5 MG immediate release tablet Take 1 tablet (5 mg total) by mouth every 6 (six) hours as needed. 5 tablet 0   pantoprazole  (PROTONIX ) 40 MG tablet Take 1 tablet (40 mg total) by mouth daily. 30 tablet 1   polyethylene glycol powder (GLYCOLAX/MIRALAX) 17 GM/SCOOP powder Take 17 g by mouth daily. Dissolve 1 capful (17g) in 4-8 ounces of liquid and take by mouth daily.     PROBIOTIC PRODUCT PO Take 1 capsule by mouth daily.     prochlorperazine  (COMPAZINE ) 10 MG tablet Take 1 tablet (10 mg total) by mouth every 6 (six) hours as needed for nausea or vomiting. 30 tablet 1   Sodium Sulfate-Mag Sulfate-KCl (SUTAB ) 1479-225-188 MG TABS Use as directed for colonoscopy. MANUFACTURER CODES!!  BIN: [id number] PCN: CN GROUP: [id number] MEMBER ID: [id number];MLW AS SECONDARY INSURANCE ;NO PRIOR AUTHORIZATION 24 tablet 0   sucralfate  (CARAFATE ) 1 GM/10ML suspension Take 10 mLs (1 g total) by mouth 4 (four) times daily -  with meals and at  bedtime. 420 mL 0   triamcinolone  (KENALOG ) 0.025 % ointment Apply 1 Application topically 2 (two) times daily. 30 g 0   triamterene-hydrochlorothiazide (MAXZIDE) 75-50 MG per tablet Take 1 tablet by mouth daily.     ZEPBOUND 12.5 MG/0.5ML Pen Inject 12.5 mg into the skin once a week.     No current facility-administered medications for this visit.     PHYSICAL EXAMINATION: ECOG PERFORMANCE STATUS: 1 - Symptomatic but completely ambulatory  BP 130/78 (BP Location: Left Arm)   Pulse 68   Temp 98.1 F (36.7 C)   Resp 17   Ht 5' 4 (1.626 m)   Wt 268 lb (121.6 kg)   SpO2 98%   BMI 46.00 kg/m   GENERAL:alert, no distress and comfortable, obese. Port site appears well Significant improvement in right breast mass. Right axillary adenopathy not palpable today CTA bilaterally RRR No LE edema.  LABORATORY DATA:  I have reviewed the data as listed Lab Results  Component Value Date   WBC 3.6 (L) 11/04/2024   HGB 11.6 (L) 11/04/2024   HCT 35.2 (L) 11/04/2024   MCV 87.6 11/04/2024   PLT 180 11/04/2024     Chemistry      Component Value Date/Time   NA 135 11/04/2024 1217   K 4.0 11/04/2024 1217   CL 99 11/04/2024 1217   CO2 25 11/04/2024 1217   BUN 14 11/04/2024 1217   CREATININE 0.74 11/04/2024 1217      Component Value Date/Time   CALCIUM 9.6 11/04/2024 1217   ALKPHOS 84 11/04/2024 1217   AST 14 (L) 11/04/2024 1217   ALT <5 11/04/2024 1217   BILITOT 0.4 11/04/2024 1217       RADIOGRAPHIC STUDIES: I have personally reviewed the radiological images as listed and agreed with the findings in the report. No results found.   All questions were answered. The patient knows to call the clinic with any problems, questions or concerns. I spent 30 minutes in the care of this patient including H and P, review of records, counseling and coordination of care.     Amber Stalls, MD 11/04/2024 1:15 PM

## 2024-11-05 ENCOUNTER — Inpatient Hospital Stay

## 2024-11-05 ENCOUNTER — Inpatient Hospital Stay: Admitting: Hematology and Oncology

## 2024-11-10 ENCOUNTER — Inpatient Hospital Stay

## 2024-11-10 ENCOUNTER — Inpatient Hospital Stay: Attending: Hematology and Oncology | Admitting: Hematology and Oncology

## 2024-11-10 VITALS — BP 138/76 | HR 84 | Temp 98.0°F | Resp 16

## 2024-11-10 VITALS — BP 132/82 | HR 82 | Temp 97.6°F | Resp 17 | Ht 64.0 in | Wt 263.7 lb

## 2024-11-10 DIAGNOSIS — Z170 Estrogen receptor positive status [ER+]: Secondary | ICD-10-CM | POA: Diagnosis not present

## 2024-11-10 DIAGNOSIS — C50511 Malignant neoplasm of lower-outer quadrant of right female breast: Secondary | ICD-10-CM | POA: Diagnosis not present

## 2024-11-10 LAB — CMP (CANCER CENTER ONLY)
ALT: <5 U/L (ref 0–44)
AST: 14 U/L — ABNORMAL LOW (ref 15–41)
Albumin: 4.3 g/dL (ref 3.5–5.0)
Alkaline Phosphatase: 87 U/L (ref 38–126)
Anion gap: 11 (ref 5–15)
BUN: 17 mg/dL (ref 6–20)
CO2: 25 mmol/L (ref 22–32)
Calcium: 9.6 mg/dL (ref 8.9–10.3)
Chloride: 98 mmol/L (ref 98–111)
Creatinine: 0.73 mg/dL (ref 0.44–1.00)
GFR, Estimated: >60 mL/min
Glucose, Bld: 90 mg/dL (ref 70–99)
Potassium: 3.7 mmol/L (ref 3.5–5.1)
Sodium: 134 mmol/L — ABNORMAL LOW (ref 135–145)
Total Bilirubin: 0.5 mg/dL (ref 0.0–1.2)
Total Protein: 6.7 g/dL (ref 6.5–8.1)

## 2024-11-10 LAB — CBC WITH DIFFERENTIAL (CANCER CENTER ONLY)
Abs Immature Granulocytes: 0.02 10*3/uL (ref 0.00–0.07)
Basophils Absolute: 0 10*3/uL (ref 0.0–0.1)
Basophils Relative: 0 %
Eosinophils Absolute: 0 10*3/uL (ref 0.0–0.5)
Eosinophils Relative: 0 %
HCT: 37.5 % (ref 36.0–46.0)
Hemoglobin: 12.6 g/dL (ref 12.0–15.0)
Immature Granulocytes: 0 %
Lymphocytes Relative: 34 %
Lymphs Abs: 1.5 10*3/uL (ref 0.7–4.0)
MCH: 29 pg (ref 26.0–34.0)
MCHC: 33.6 g/dL (ref 30.0–36.0)
MCV: 86.4 fL (ref 80.0–100.0)
Monocytes Absolute: 0.2 10*3/uL (ref 0.1–1.0)
Monocytes Relative: 4 %
Neutro Abs: 2.7 10*3/uL (ref 1.7–7.7)
Neutrophils Relative %: 62 %
Platelet Count: 212 10*3/uL (ref 150–400)
RBC: 4.34 MIL/uL (ref 3.87–5.11)
RDW: 15 % (ref 11.5–15.5)
WBC Count: 4.5 10*3/uL (ref 4.0–10.5)
nRBC: 0 % (ref 0.0–0.2)

## 2024-11-10 MED ORDER — FAMOTIDINE IN NACL 20-0.9 MG/50ML-% IV SOLN
20.0000 mg | Freq: Once | INTRAVENOUS | Status: AC
  Administered 2024-11-10: 20 mg via INTRAVENOUS
  Filled 2024-11-10: qty 50

## 2024-11-10 MED ORDER — PALONOSETRON HCL INJECTION 0.25 MG/5ML
0.2500 mg | Freq: Once | INTRAVENOUS | Status: AC
  Administered 2024-11-10: 0.25 mg via INTRAVENOUS
  Filled 2024-11-10: qty 5

## 2024-11-10 MED ORDER — AZELASTINE-FLUTICASONE 137-50 MCG/ACT NA SUSP: 1.0000 | Freq: Two times a day (BID) | NASAL | 1 refills | Status: AC

## 2024-11-10 MED ORDER — SODIUM CHLORIDE 0.9 % IV SOLN: INTRAVENOUS | Status: DC

## 2024-11-10 MED ORDER — SODIUM CHLORIDE 0.9 % IV SOLN
65.0000 mg/m2 | Freq: Once | INTRAVENOUS | Status: AC
  Administered 2024-11-10: 150 mg via INTRAVENOUS
  Filled 2024-11-10: qty 25

## 2024-11-10 MED ORDER — DEXAMETHASONE SOD PHOSPHATE PF 10 MG/ML IJ SOLN
10.0000 mg | Freq: Once | INTRAMUSCULAR | Status: AC
  Administered 2024-11-10: 10 mg via INTRAVENOUS
  Filled 2024-11-10: qty 1

## 2024-11-10 MED ORDER — SODIUM CHLORIDE 0.9 % IV SOLN
202.2000 mg | Freq: Once | INTRAVENOUS | Status: AC
  Administered 2024-11-10: 200 mg via INTRAVENOUS
  Filled 2024-11-10: qty 20

## 2024-11-10 MED ORDER — DIPHENHYDRAMINE HCL 50 MG/ML IJ SOLN
50.0000 mg | Freq: Once | INTRAMUSCULAR | Status: AC
  Administered 2024-11-10: 50 mg via INTRAVENOUS
  Filled 2024-11-10: qty 1

## 2024-11-17 ENCOUNTER — Inpatient Hospital Stay: Admitting: Hematology and Oncology

## 2024-11-17 ENCOUNTER — Institutional Professional Consult (permissible substitution): Admitting: Plastic Surgery

## 2024-11-17 ENCOUNTER — Inpatient Hospital Stay

## 2024-11-18 ENCOUNTER — Inpatient Hospital Stay

## 2024-11-18 ENCOUNTER — Inpatient Hospital Stay: Admitting: Hematology and Oncology

## 2024-11-19 ENCOUNTER — Inpatient Hospital Stay

## 2024-11-20 ENCOUNTER — Inpatient Hospital Stay

## 2024-11-21 ENCOUNTER — Inpatient Hospital Stay

## 2024-11-24 ENCOUNTER — Inpatient Hospital Stay: Admitting: Hematology and Oncology

## 2024-11-24 ENCOUNTER — Inpatient Hospital Stay

## 2024-12-01 ENCOUNTER — Inpatient Hospital Stay: Admitting: Hematology and Oncology

## 2024-12-01 ENCOUNTER — Inpatient Hospital Stay

## 2024-12-02 ENCOUNTER — Inpatient Hospital Stay

## 2024-12-02 ENCOUNTER — Inpatient Hospital Stay: Admitting: Hematology and Oncology
